# Patient Record
Sex: Male | Born: 1955 | Race: White | Marital: Single | Smoking: Current every day smoker
Health system: Southern US, Community
[De-identification: ages and names within clinical notes are randomized; demographics above are authoritative.]

## PROBLEM LIST (undated history)

## (undated) DIAGNOSIS — E119 Type 2 diabetes mellitus without complications: Secondary | ICD-10-CM

## (undated) DIAGNOSIS — J449 Chronic obstructive pulmonary disease, unspecified: Secondary | ICD-10-CM

## (undated) DIAGNOSIS — E785 Hyperlipidemia, unspecified: Secondary | ICD-10-CM

## (undated) DIAGNOSIS — I10 Essential (primary) hypertension: Secondary | ICD-10-CM

## (undated) DIAGNOSIS — C801 Malignant (primary) neoplasm, unspecified: Secondary | ICD-10-CM

## (undated) HISTORY — PX: CORONARY STENT PLACEMENT: SHX1402

## (undated) HISTORY — PX: APPENDECTOMY: SHX54

---

## 2007-03-19 HISTORY — PX: BELOW KNEE LEG AMPUTATION: SUR23

## 2012-03-23 ENCOUNTER — Inpatient Hospital Stay (HOSPITAL_COMMUNITY)
Admission: EM | Admit: 2012-03-23 | Discharge: 2012-03-25 | DRG: 247 | Disposition: A | Discharge status: Home / Self Care | Payer: Non-veteran care | Attending: Cardiovascular Disease | Admitting: Cardiovascular Disease

## 2012-03-23 ENCOUNTER — Encounter (HOSPITAL_COMMUNITY): Payer: Self-pay | Admitting: Emergency Medicine

## 2012-03-23 ENCOUNTER — Emergency Department (HOSPITAL_COMMUNITY): Payer: Non-veteran care

## 2012-03-23 DIAGNOSIS — Y9210 Unspecified residential institution as the place of occurrence of the external cause: Secondary | ICD-10-CM | POA: Diagnosis not present

## 2012-03-23 DIAGNOSIS — I2510 Atherosclerotic heart disease of native coronary artery without angina pectoris: Secondary | ICD-10-CM | POA: Diagnosis present

## 2012-03-23 DIAGNOSIS — E119 Type 2 diabetes mellitus without complications: Secondary | ICD-10-CM | POA: Diagnosis present

## 2012-03-23 DIAGNOSIS — Z79899 Other long term (current) drug therapy: Secondary | ICD-10-CM

## 2012-03-23 DIAGNOSIS — T50995A Adverse effect of other drugs, medicaments and biological substances, initial encounter: Secondary | ICD-10-CM | POA: Diagnosis not present

## 2012-03-23 DIAGNOSIS — Z89519 Acquired absence of unspecified leg below knee: Secondary | ICD-10-CM

## 2012-03-23 DIAGNOSIS — L270 Generalized skin eruption due to drugs and medicaments taken internally: Secondary | ICD-10-CM | POA: Diagnosis not present

## 2012-03-23 DIAGNOSIS — S88119A Complete traumatic amputation at level between knee and ankle, unspecified lower leg, initial encounter: Secondary | ICD-10-CM

## 2012-03-23 DIAGNOSIS — I10 Essential (primary) hypertension: Secondary | ICD-10-CM | POA: Diagnosis present

## 2012-03-23 DIAGNOSIS — Z955 Presence of coronary angioplasty implant and graft: Secondary | ICD-10-CM

## 2012-03-23 DIAGNOSIS — I214 Non-ST elevation (NSTEMI) myocardial infarction: Principal | ICD-10-CM | POA: Diagnosis present

## 2012-03-23 DIAGNOSIS — E7800 Pure hypercholesterolemia, unspecified: Secondary | ICD-10-CM | POA: Diagnosis present

## 2012-03-23 DIAGNOSIS — Z87891 Personal history of nicotine dependence: Secondary | ICD-10-CM

## 2012-03-23 DIAGNOSIS — E785 Hyperlipidemia, unspecified: Secondary | ICD-10-CM | POA: Diagnosis present

## 2012-03-23 HISTORY — DX: Hyperlipidemia, unspecified: E78.5

## 2012-03-23 HISTORY — DX: Essential (primary) hypertension: I10

## 2012-03-23 HISTORY — DX: Type 2 diabetes mellitus without complications: E11.9

## 2012-03-23 LAB — COMPREHENSIVE METABOLIC PANEL
Albumin: 3.6 g/dL (ref 3.5–5.2)
Alkaline Phosphatase: 181 U/L — ABNORMAL HIGH (ref 39–117)
BUN: 12 mg/dL (ref 6–23)
CO2: 30 mEq/L (ref 19–32)
Chloride: 99 mEq/L (ref 96–112)
Creatinine, Ser: 0.95 mg/dL (ref 0.50–1.35)
GFR calc Af Amer: >90 mL/min (ref >90)
GFR calc non Af Amer: >90 mL/min (ref >90)
Glucose, Bld: 105 mg/dL — ABNORMAL HIGH (ref 70–99)
Potassium: 4.2 mEq/L (ref 3.5–5.1)
Total Bilirubin: 0.4 mg/dL (ref 0.3–1.2)

## 2012-03-23 LAB — POCT I-STAT TROPONIN I: Troponin i, poc: 0.03 ng/mL (ref 0.00–0.08)

## 2012-03-23 LAB — CBC
HCT: 47.5 % (ref 39.0–52.0)
Hemoglobin: 16.9 g/dL (ref 13.0–17.0)
MCV: 91.2 fL (ref 78.0–100.0)
RBC: 5.21 MIL/uL (ref 4.22–5.81)
WBC: 11.6 10*3/uL — ABNORMAL HIGH (ref 4.0–10.5)

## 2012-03-23 MED ORDER — MORPHINE SULFATE 4 MG/ML IJ SOLN
4.0000 mg | INTRAMUSCULAR | Status: DC | PRN
  Administered 2012-03-23 – 2012-03-24 (×3): 4 mg via INTRAVENOUS
  Filled 2012-03-23 (×3): qty 1

## 2012-03-23 MED ORDER — ASPIRIN 81 MG PO CHEW
324.0000 mg | CHEWABLE_TABLET | Freq: Once | ORAL | Status: AC
  Administered 2012-03-23: 324 mg via ORAL

## 2012-03-23 MED ORDER — ONDANSETRON HCL 4 MG/2ML IJ SOLN
4.0000 mg | Freq: Once | INTRAMUSCULAR | Status: AC
  Administered 2012-03-23: 4 mg via INTRAVENOUS
  Filled 2012-03-23: qty 2

## 2012-03-23 MED ORDER — HEPARIN BOLUS VIA INFUSION
4000.0000 [IU] | Freq: Once | INTRAVENOUS | Status: AC
  Administered 2012-03-24: 4000 [IU] via INTRAVENOUS

## 2012-03-23 MED ORDER — HEPARIN (PORCINE) IN NACL 100-0.45 UNIT/ML-% IJ SOLN
1400.0000 [IU]/h | INTRAMUSCULAR | Status: DC
  Administered 2012-03-24: 1200 [IU]/h via INTRAVENOUS
  Filled 2012-03-23 (×2): qty 250

## 2012-03-23 MED ORDER — NITROGLYCERIN 0.4 MG SL SUBL
0.4000 mg | SUBLINGUAL_TABLET | SUBLINGUAL | Status: AC | PRN
  Administered 2012-03-23 (×3): 0.4 mg via SUBLINGUAL
  Filled 2012-03-23 (×3): qty 25

## 2012-03-23 NOTE — Progress Notes (Signed)
ANTICOAGULATION CONSULT NOTE - Initial Consult  Pharmacy Consult for heparin Indication: chest pain/ACS  Allergies  Allergen Reactions  . Ciprofloxacin     Patient Measurements: Height: 5\' 9"  (175.3 cm) Weight: 210 lb (95.255 kg) IBW/kg (Calculated) : 70.7  Heparin Dosing Weight: 90 kg   Vital Signs: Temp: 99.3 F (37.4 C) (01/06 1853) Temp src: Oral (01/06 1853) BP: 130/70 mmHg (01/06 2300) Pulse Rate: 93  (01/06 2300)  Labs:  Basename 03/23/12 2009  HGB 16.9  HCT 47.5  PLT 249  APTT --  LABPROT --  INR --  HEPARINUNFRC --  CREATININE 0.95  CKTOTAL --  CKMB --  TROPONINI --    Estimated Creatinine Clearance: 100 ml/min (by C-G formula based on Cr of 0.95).   Medical History: Past Medical History  Diagnosis Date  . Hypertension   . Diabetes mellitus without complication   . Hyperlipemia     Medications:  Scheduled:    . [COMPLETED] aspirin  324 mg Oral Once  . [COMPLETED] ondansetron (ZOFRAN) IV  4 mg Intravenous Once    Assessment: 57 yo male presented with chest pain. Pharmacy to manage IV heparin.   Goal of Therapy:  Heparin level 0.3-0.7 units/ml Monitor platelets by anticoagulation protocol: Yes   Plan:  1. Heparin IV bolus of 4000 units x 1, then IV infusion of 1200 units/hr.  2. Heparin level in 6 hours.  3. Daily CBC, heparin level.   Emeline Gins 03/23/2012,11:40 PM

## 2012-03-23 NOTE — ED Notes (Signed)
Pt arrived by EMS. C/o cp starting around 1745. Pt stated that cp is a tightness in chest with pain to left shoulder. No SOB, n/v. 225/120 on arrival with some diaphoresis. EMS administered ASA 324MG .

## 2012-03-23 NOTE — ED Provider Notes (Signed)
History     CSN: [id number]  Arrival date & time 03/23/12  1845   First MD Initiated Contact with Patient 03/23/12 1852      Chief Complaint  Patient presents with  . Chest Pain    (Consider location/radiation/quality/duration/timing/severity/associated sxs/prior treatment) HPI  Martin Thomas is a 57 y.o. male c/o pressure-like substerma chest pain, radiating to both arms, rated at 4/10 at worst, 4/10 now.  Pain started x1hours ago while pt was sleeping. Pain has been constant, non-exertional, non-pleuritic or positional. Pain is associated with SOB, diaphoresis. Denies N/V, cough, fever, cough, back pain, syncope. Denies recent travel, leg swelling, hemoptysis. Pt had ASA enroute.   RF: former smoker, DM, HTN, high cholesterol, FH Cath: none Last Stress test: years ago PCP: [facility] at [facility]  No past medical history on file.  No past surgical history on file.  No family history on file.  History  Substance Use Topics  . Smoking status: Not on file  . Smokeless tobacco: Not on file  . Alcohol Use: Not on file      Review of Systems  Constitutional: Positive for diaphoresis. Negative for fever.  Respiratory: Positive for shortness of breath.   Cardiovascular: Positive for chest pain.  Gastrointestinal: Negative for nausea, vomiting, abdominal pain and diarrhea.  All other systems reviewed and are negative.    Allergies  Review of patient's allergies indicates not on file.  Home Medications  No current outpatient prescriptions on file.  BP 166/106  Pulse 102  Temp 99.3 F (37.4 C) (Oral)  Resp 14  SpO2 92%  Physical Exam  Nursing note and vitals reviewed. Constitutional: He is oriented to person, place, and time. He appears well-developed and well-nourished. No distress.       Left BKA  HENT:  Head: Normocephalic.  Mouth/Throat: Oropharynx is clear and moist.  Eyes: Conjunctivae normal and EOM are normal. Pupils are equal, round, and reactive to  light.  Cardiovascular: Normal rate, regular rhythm, normal heart sounds and intact distal pulses.   Pulmonary/Chest: Effort normal and breath sounds normal. No stridor. No respiratory distress. He has no wheezes. He has no rales. He exhibits no tenderness.  Abdominal: Soft. Bowel sounds are normal. He exhibits no distension and no mass. There is no tenderness. There is no guarding.  Musculoskeletal: Normal range of motion.  Neurological: He is alert and oriented to person, place, and time.  Psychiatric: He has a normal mood and affect.    ED Course  Procedures (including critical care time)  Labs Reviewed  CBC - Abnormal; Notable for the following:    WBC 11.6 (*)     All other components within normal limits  COMPREHENSIVE METABOLIC PANEL - Abnormal; Notable for the following:    Glucose, Bld 105 (*)     Calcium 11.0 (*)     AST 66 (*)     ALT 82 (*)     Alkaline Phosphatase 181 (*)     All other components within normal limits  POCT I-STAT TROPONIN I - Abnormal; Notable for the following:    Troponin i, poc 0.14 (*)     All other components within normal limits  POCT I-STAT TROPONIN I   Dg Chest Portable 1 View  03/23/2012  *RADIOLOGY REPORT*  Clinical Data: Chest pain.  Hypertensive diabetic.  Asthma.  Prior smoker.  PORTABLE CHEST - 1 VIEW  Comparison: None.  Findings: Pulmonary vascular prominence with peribronchial thickening.  No segmental infiltrate or gross pneumothorax.  Heart size within normal limits.  Calcified aorta.  The patient would eventually benefit from follow-up two-view chest with cardiac leads removed.  IMPRESSION: Pulmonary vascular prominence with peribronchial thickening.  Please see above.   Original Report Authenticated By: Lacy Duverney, M.D.     1. NSTEMI (non-ST elevated myocardial infarction)      MDM  Patient with very convincing story for cardiac origin chest pain. Pain is relieved with nitroglycerin.  TIMI score of 1 based on multiple  RF  Delta Trop 0.14   Patient will be admitted under the care of Dr. Julian Reil of [facility].     Wynetta Emery, PA-C 03/24/12 0003

## 2012-03-24 ENCOUNTER — Encounter (HOSPITAL_COMMUNITY)
Admission: EM | Disposition: A | Discharge status: Home / Self Care | Payer: Self-pay | Source: Home / Self Care | Attending: Cardiovascular Disease

## 2012-03-24 ENCOUNTER — Other Ambulatory Visit: Payer: Self-pay

## 2012-03-24 ENCOUNTER — Encounter (HOSPITAL_COMMUNITY): Payer: Self-pay | Admitting: Internal Medicine

## 2012-03-24 DIAGNOSIS — I214 Non-ST elevation (NSTEMI) myocardial infarction: Secondary | ICD-10-CM | POA: Diagnosis present

## 2012-03-24 DIAGNOSIS — Z89519 Acquired absence of unspecified leg below knee: Secondary | ICD-10-CM

## 2012-03-24 DIAGNOSIS — E7800 Pure hypercholesterolemia, unspecified: Secondary | ICD-10-CM | POA: Diagnosis present

## 2012-03-24 DIAGNOSIS — E119 Type 2 diabetes mellitus without complications: Secondary | ICD-10-CM | POA: Diagnosis present

## 2012-03-24 DIAGNOSIS — I10 Essential (primary) hypertension: Secondary | ICD-10-CM | POA: Diagnosis present

## 2012-03-24 HISTORY — PX: LEFT HEART CATHETERIZATION WITH CORONARY ANGIOGRAM: SHX5451

## 2012-03-24 HISTORY — PX: PERCUTANEOUS CORONARY STENT INTERVENTION (PCI-S): SHX5485

## 2012-03-24 LAB — GLUCOSE, CAPILLARY
Glucose-Capillary: 194 mg/dL — ABNORMAL HIGH (ref 70–99)
Glucose-Capillary: 216 mg/dL — ABNORMAL HIGH (ref 70–99)

## 2012-03-24 LAB — CBC
HCT: 44.2 % (ref 39.0–52.0)
Hemoglobin: 15.2 g/dL (ref 13.0–17.0)
MCH: 31.7 pg (ref 26.0–34.0)
MCHC: 34.4 g/dL (ref 30.0–36.0)
MCV: 92.1 fL (ref 78.0–100.0)
RDW: 14.2 % (ref 11.5–15.5)

## 2012-03-24 LAB — TSH: TSH: 6.001 u[IU]/mL — ABNORMAL HIGH (ref 0.350–4.500)

## 2012-03-24 LAB — TROPONIN I: Troponin I: 0.63 ng/mL (ref <0.30)

## 2012-03-24 SURGERY — LEFT HEART CATHETERIZATION WITH CORONARY ANGIOGRAM
Anesthesia: LOCAL

## 2012-03-24 MED ORDER — MIDAZOLAM HCL 2 MG/2ML IJ SOLN
INTRAMUSCULAR | Status: AC
  Filled 2012-03-24: qty 2

## 2012-03-24 MED ORDER — BIVALIRUDIN 250 MG IV SOLR
INTRAVENOUS | Status: AC
  Filled 2012-03-24: qty 250

## 2012-03-24 MED ORDER — HEPARIN SODIUM (PORCINE) 1000 UNIT/ML IJ SOLN
INTRAMUSCULAR | Status: AC
  Filled 2012-03-24: qty 1

## 2012-03-24 MED ORDER — INSULIN ASPART 100 UNIT/ML ~~LOC~~ SOLN
0.0000 [IU] | SUBCUTANEOUS | Status: DC
  Administered 2012-03-24: 23:00:00 11 [IU] via SUBCUTANEOUS
  Administered 2012-03-24: 3 [IU] via SUBCUTANEOUS
  Administered 2012-03-24: 5 [IU] via SUBCUTANEOUS
  Administered 2012-03-24: 3 [IU] via SUBCUTANEOUS

## 2012-03-24 MED ORDER — ASPIRIN 81 MG PO CHEW: 324.0000 mg | CHEWABLE_TABLET | ORAL | Status: DC

## 2012-03-24 MED ORDER — NITROGLYCERIN 0.2 MG/ML ON CALL CATH LAB
INTRAVENOUS | Status: AC
  Filled 2012-03-24: qty 1

## 2012-03-24 MED ORDER — ALPRAZOLAM 0.25 MG PO TABS: 0.2500 mg | ORAL_TABLET | Freq: Two times a day (BID) | ORAL | Status: DC | PRN

## 2012-03-24 MED ORDER — ACETAMINOPHEN 325 MG PO TABS
650.0000 mg | ORAL_TABLET | ORAL | Status: DC | PRN
  Administered 2012-03-24: 650 mg via ORAL
  Filled 2012-03-24: qty 2

## 2012-03-24 MED ORDER — ONDANSETRON HCL 4 MG/2ML IJ SOLN: 4.0000 mg | Freq: Four times a day (QID) | INTRAMUSCULAR | Status: DC | PRN

## 2012-03-24 MED ORDER — ASPIRIN 300 MG RE SUPP: 300.0000 mg | RECTAL | Status: DC

## 2012-03-24 MED ORDER — ASPIRIN EC 81 MG PO TBEC
81.0000 mg | DELAYED_RELEASE_TABLET | Freq: Every day | ORAL | Status: DC
  Administered 2012-03-25: 81 mg via ORAL
  Filled 2012-03-24: qty 1

## 2012-03-24 MED ORDER — METOPROLOL TARTRATE 12.5 MG HALF TABLET
12.5000 mg | ORAL_TABLET | Freq: Two times a day (BID) | ORAL | Status: DC
  Administered 2012-03-24 (×2): 12.5 mg via ORAL
  Filled 2012-03-24 (×4): qty 1

## 2012-03-24 MED ORDER — CYCLOBENZAPRINE HCL 10 MG PO TABS
10.0000 mg | ORAL_TABLET | Freq: Every day | ORAL | Status: DC
  Administered 2012-03-24: 21:00:00 10 mg via ORAL
  Filled 2012-03-24 (×2): qty 1

## 2012-03-24 MED ORDER — LISINOPRIL 10 MG PO TABS
10.0000 mg | ORAL_TABLET | Freq: Every day | ORAL | Status: DC
  Administered 2012-03-24 – 2012-03-25 (×2): 10 mg via ORAL
  Filled 2012-03-24 (×2): qty 1

## 2012-03-24 MED ORDER — FENTANYL CITRATE 0.05 MG/ML IJ SOLN
INTRAMUSCULAR | Status: AC
  Filled 2012-03-24: qty 2

## 2012-03-24 MED ORDER — PRASUGREL HCL 10 MG PO TABS
10.0000 mg | ORAL_TABLET | Freq: Every day | ORAL | Status: DC
  Administered 2012-03-25: 10 mg via ORAL
  Filled 2012-03-24: qty 1

## 2012-03-24 MED ORDER — TRAZODONE HCL 50 MG PO TABS
50.0000 mg | ORAL_TABLET | Freq: Every day | ORAL | Status: DC
  Administered 2012-03-24: 21:00:00 50 mg via ORAL
  Filled 2012-03-24 (×2): qty 1

## 2012-03-24 MED ORDER — SODIUM CHLORIDE 0.9 % IV SOLN
INTRAVENOUS | Status: DC
  Administered 2012-03-24: 20 mL/h via INTRAVENOUS

## 2012-03-24 MED ORDER — PANTOPRAZOLE SODIUM 40 MG PO TBEC
80.0000 mg | DELAYED_RELEASE_TABLET | Freq: Every day | ORAL | Status: DC
  Administered 2012-03-24 – 2012-03-25 (×2): 80 mg via ORAL
  Filled 2012-03-24 (×2): qty 2

## 2012-03-24 MED ORDER — LIDOCAINE HCL (PF) 1 % IJ SOLN
INTRAMUSCULAR | Status: AC
  Filled 2012-03-24: qty 30

## 2012-03-24 MED ORDER — NITROGLYCERIN IN D5W 200-5 MCG/ML-% IV SOLN
2.0000 ug/min | INTRAVENOUS | Status: DC
  Administered 2012-03-24: 3 ug/min via INTRAVENOUS
  Filled 2012-03-24: qty 250

## 2012-03-24 MED ORDER — NITROGLYCERIN 0.4 MG SL SUBL: 0.4000 mg | SUBLINGUAL_TABLET | SUBLINGUAL | Status: DC | PRN

## 2012-03-24 MED ORDER — PREDNISONE 50 MG PO TABS
60.0000 mg | ORAL_TABLET | Freq: Four times a day (QID) | ORAL | Status: DC
  Administered 2012-03-24 – 2012-03-25 (×2): 60 mg via ORAL
  Filled 2012-03-24 (×3): qty 1

## 2012-03-24 MED ORDER — SENNA 8.6 MG PO TABS
1.0000 | ORAL_TABLET | Freq: Every day | ORAL | Status: DC
  Administered 2012-03-24 – 2012-03-25 (×2): 8.6 mg via ORAL
  Filled 2012-03-24 (×2): qty 1

## 2012-03-24 MED ORDER — OMEGA-3-ACID ETHYL ESTERS 1 G PO CAPS
2.0000 g | ORAL_CAPSULE | Freq: Two times a day (BID) | ORAL | Status: DC
  Administered 2012-03-24 – 2012-03-25 (×3): 2 g via ORAL
  Filled 2012-03-24 (×4): qty 2

## 2012-03-24 MED ORDER — DIPHENHYDRAMINE HCL 25 MG PO CAPS: 25.0000 mg | ORAL_CAPSULE | Freq: Four times a day (QID) | ORAL | Status: DC | PRN

## 2012-03-24 MED ORDER — ATORVASTATIN CALCIUM 20 MG PO TABS
20.0000 mg | ORAL_TABLET | Freq: Every day | ORAL | Status: DC
  Administered 2012-03-24 – 2012-03-25 (×2): 20 mg via ORAL
  Filled 2012-03-24 (×2): qty 1

## 2012-03-24 MED ORDER — GABAPENTIN 100 MG PO CAPS
200.0000 mg | ORAL_CAPSULE | Freq: Three times a day (TID) | ORAL | Status: DC
  Administered 2012-03-24 – 2012-03-25 (×3): 200 mg via ORAL
  Filled 2012-03-24 (×7): qty 2

## 2012-03-24 MED ORDER — SODIUM CHLORIDE 0.9 % IV SOLN
1.0000 mL/kg/h | INTRAVENOUS | Status: AC
  Administered 2012-03-24: 1 mL/kg/h via INTRAVENOUS

## 2012-03-24 MED ORDER — HEPARIN (PORCINE) IN NACL 2-0.9 UNIT/ML-% IJ SOLN
INTRAMUSCULAR | Status: AC
  Filled 2012-03-24: qty 1000

## 2012-03-24 MED ORDER — VERAPAMIL HCL 2.5 MG/ML IV SOLN
INTRAVENOUS | Status: AC
  Filled 2012-03-24: qty 2

## 2012-03-24 MED ORDER — PRASUGREL HCL 10 MG PO TABS
ORAL_TABLET | ORAL | Status: AC
  Administered 2012-03-25: 10:00:00 10 mg via ORAL
  Filled 2012-03-24: qty 6

## 2012-03-24 MED ORDER — ALBUTEROL SULFATE HFA 108 (90 BASE) MCG/ACT IN AERS
2.0000 | INHALATION_SPRAY | Freq: Four times a day (QID) | RESPIRATORY_TRACT | Status: DC | PRN
  Administered 2012-03-24: 2 via RESPIRATORY_TRACT
  Filled 2012-03-24: qty 6.7

## 2012-03-24 NOTE — H&P (Addendum)
[facility] History and Physical  Jameil Whitmoyer AVW:[id number] DOB: 1955/03/19 DOA: 03/23/2012  Referring physician: ED PCP: Default, Provider, MD  Specialists: None  Chief Complaint: Chest pain  HPI: Martin Thomas is a 57 y.o. male presented to ED with c/o pressure-like, substernal chest pain, radiating to both arms, 4/10 at worst, woke him from sleep onset 1 hour PTA, constant, relieved finally by morphine.  Is associated with SOB, diaphoresis, no N/V, cough, fever.  In the ED the patient was found to have an elevated troponin (which continues to rise), to go along with his chest pain.  Hospitalist is admitting [facility].  Review of Systems: 12 systems reviewed and otherwise negative.  Past Medical History  Diagnosis Date  . Hypertension   . Diabetes mellitus without complication   . Hyperlipemia    Past Surgical History  Procedure Date  . Below knee leg amputation     left  . Appendectomy    Social History:  reports that he quit smoking about 2 months ago. He does not have any smokeless tobacco history on file. He reports that he does not drink alcohol or use illicit drugs.   Allergies  Allergen Reactions  . Ciprofloxacin     Family History  Problem Relation Age of Onset  . CAD    . Heart attack Father     died of second MI at 62  . Heart attack Mother   . Heart attack Paternal Uncle 53    died of MI at 95    Prior to Admission medications   Medication Sig Start Date End Date Taking? Authorizing Provider  albuterol (PROVENTIL HFA;VENTOLIN HFA) 108 (90 BASE) MCG/ACT inhaler Inhale 2 puffs into the lungs every 6 (six) hours as needed.   Yes Historical Provider, MD  atorvastatin (LIPITOR) 20 MG tablet Take 20 mg by mouth daily.   Yes Historical Provider, MD  cyclobenzaprine (FLEXERIL) 10 MG tablet Take 10 mg by mouth at bedtime.   Yes Historical Provider, MD  gabapentin (NEURONTIN) 100 MG capsule Take 200 mg by mouth 3 (three) times daily.    Yes Historical Provider, MD  HYDROcodone-acetaminophen (NORCO/VICODIN) 5-325 MG per tablet Take 1 tablet by mouth every 8 (eight) hours as needed. For pain   Yes Historical Provider, MD  lisinopril (PRINIVIL,ZESTRIL) 10 MG tablet Take 10 mg by mouth daily.   Yes Historical Provider, MD  omega-3 acid ethyl esters (LOVAZA) 1 G capsule Take 2 g by mouth 2 (two) times daily.   Yes Historical Provider, MD  omeprazole (PRILOSEC) 40 MG capsule Take 40 mg by mouth daily.   Yes Historical Provider, MD  senna (SENOKOT) 8.6 MG TABS Take 1 tablet by mouth daily.   Yes Historical Provider, MD  traZODone (DESYREL) 50 MG tablet Take 50 mg by mouth at bedtime.   Yes Historical Provider, MD   Physical Exam: Filed Vitals:   03/23/12 2015 03/23/12 2115 03/23/12 2145 03/23/12 2300  BP: 151/77 115/71 146/94 130/70  Pulse: 107 108 90 93  Temp:      TempSrc:      Resp: 15 21 13 13   Height:      Weight:      SpO2: 93% 92% 90% 92%    General:  NAD, resting comfortably in bed Eyes: PEERLA EOMI ENT: mucous membranes moist Neck: supple w/o JVD Cardiovascular: RRR w/o MRG Respiratory: CTA B Abdomen: soft, nt, nd, bs+ Skin: no rash nor lesion Musculoskeletal: MAE, full ROM all 4 extremities  Psychiatric: normal tone and affect Neurologic: AAOx3, grossly non-focal  Labs on Admission:  Basic Metabolic Panel:  Lab 03/23/12 1478  NA 140  K 4.2  CL 99  CO2 30  GLUCOSE 105*  BUN 12  CREATININE 0.95  CALCIUM 11.0*  MG --  PHOS --   Liver Function Tests:  Lab 03/23/12 2009  AST 66*  ALT 82*  ALKPHOS 181*  BILITOT 0.4  PROT 7.5  ALBUMIN 3.6   No results found for this basename: LIPASE:5,AMYLASE:5 in the last 168 hours No results found for this basename: AMMONIA:5 in the last 168 hours CBC:  Lab 03/23/12 2009  WBC 11.6*  NEUTROABS --  HGB 16.9  HCT 47.5  MCV 91.2  PLT 249   Cardiac Enzymes: No results found for this basename: CKTOTAL:5,CKMB:5,CKMBINDEX:5,TROPONINI:5 in the last 168  hours  BNP (last 3 results) No results found for this basename: PROBNP:3 in the last 8760 hours CBG: No results found for this basename: GLUCAP:5 in the last 168 hours  Radiological Exams on Admission: Dg Chest Portable 1 View  03/23/2012  *RADIOLOGY REPORT*  Clinical Data: Chest pain.  Hypertensive diabetic.  Asthma.  Prior smoker.  PORTABLE CHEST - 1 VIEW  Comparison: None.  Findings: Pulmonary vascular prominence with peribronchial thickening.  No segmental infiltrate or gross pneumothorax.  Heart size within normal limits.  Calcified aorta.  The patient would eventually benefit from follow-up two-view chest with cardiac leads removed.  IMPRESSION: Pulmonary vascular prominence with peribronchial thickening.  Please see above.   Original Report Authenticated By: Lacy Duverney, M.D.     EKG: Independently reviewed.  Assessment/Plan Principal Problem:  *NSTEMI (non-ST elevated myocardial infarction) Active Problems:  DM2 (diabetes mellitus, type 2)  HTN (hypertension)  High cholesterol   1. NSTEMI - patient having very classic presentation of NSTEMI with numerous risk factors including DM, HTN, high cholesterol, smoking history, and family history.  Also having elevated troponin, and classic anginal symptoms.  Additionally I question if there may be a faint Q wave in lead III.  Regardless I have spoken with [facility], started patient on heparin gtt, made patient NPO in anticipation of cath lab possibly tomorrow.  Diagnosis discussed with patient and he states he actually "figured he was going to have one sooner or later" given his family history. 2. DM2 - patient doesn't appear to be on any home meds at this time, putting him on med dose SSI while here Q4H since he is currently NPO. 3. HTN - continue home meds, also starting min dose beta blocker for #1 4. High cholesterol - continue home statin.  Spoke with [facility] and they will see patient in AM.  Code Status: Full Code (must indicate code  status--if unknown or must be presumed, indicate so) Family Communication: No family in room (indicate person spoken with, if applicable, with phone number if by telephone) Disposition Plan: Admit to inpatient (indicate anticipated LOS)  Time spent: 70 min  GARDNER, JARED M. [facility] Pager [phone]  If 7PM-7AM, please contact night-coverage [email or web address] Password [email or web address] 03/24/2012, 2:31 AM

## 2012-03-24 NOTE — Brief Op Note (Addendum)
Brief Cardiac Catheterization & PCI Note  03/23/2012 - 03/24/2012  3:23 PM  PATIENT:  Martin Thomas  57 y.o. male with Cardiac RFs of HTN, HLD, DM - presented with NSTEMI.  Referred for Cardiac Cath / PCI.  PRE-OPERATIVE DIAGNOSIS:  NSTEMI  POST-OPERATIVE DIAGNOSIS:  Culprit lesion - 80-90% distal RCA just prior to RPDA/PAV bifurcation with an additional separate mid RCA 80% lesion   Mild to moderate lesions in mid LAD & Cx-Lateral OM.  Normal, preserved EF 55-60%, no WMA & EDP.  Mild IVP Contrast Reaction -- improved after IV Solumedrol & IV Benadryl.  PROCEDURE:  Procedure(s) (LRB) with comments: LEFT HEART CATHETERIZATION WITH CORONARY ANGIOGRAM (N/A) PERCUTANEOUS CORONARY STENT INTERVENTION (PCI-S) ()  SURGEON:   * Marykay Lex, MD - Primary  ANESTHESIA:   local and IV sedation; Fentanyl - 100cg; Versed 4 mg;   EBL:  Total I/O In: 165.1 [I.V.:165.1] Out: 375 [Urine:375]  EQUIPMENT: 5 Fr R Radial Glide sheath; TIG 4.0 (L&RCA Angiography), Angled Pigtail (LV Hemodynamics & LV Gram); PCI - 6Fr sheat upsize, 6Fr JR4, BMW into RPAV, Prowater for PDA as workhorse)   PCI - dRCA 3.9mm x 16 mm Promus Premier DES - post-dilated to 3.3 mm  Mid RCA 3.5 mm x 16 mm Promus Premier DES - post-dilated to 4.0 mm  MEDICATIONS USED:  LIDOCAINE 2ml; IV Solumedrol 125mg , IV Benadryl 25mg , IV Pepcid 25mg   Heparin 5500 Units after sheath placed  Angiomax Bolus & gtt - reduced to low rate x 2 hr post PCI  60mg  PO Effient Radial Cocktail: 5 mg Verapamil, 400 mcg NTG, 2 ml 2% Lidocaine in 10 ml NS Omnipaque Contrast:  TR Band: 14 ml Air, 1510 - non-occlusive hemostatis.  DICTATION: .Note written in paper chart and Note written in EPIC  PLAN OF CARE: Admit for overnight observation; Angiomax x 2 hr @ reduced rate.  Adjust cardiac meds, but anticipate d/c as early as AM.  Monitor for post IVP reaction.  PATIENT DISPOSITION:  PACU - hemodynamically stable.   Delay start of  Pharmacological VTE agent (>24hrs) due to surgical blood loss or risk of bleeding: not applicable.  Marykay Lex, M.D., M.S. [facility] [address] [place], [place]  [address]  [phone] Pager # [phone] 03/24/2012 3:31 PM

## 2012-03-24 NOTE — Progress Notes (Signed)
ANTICOAGULATION CONSULT NOTE - Follow Up Consult  Pharmacy Consult for heparin Indication: chest pain/ACS  Labs:  Basename 03/24/12 0615 03/24/12 0240 03/23/12 2343 03/23/12 2009  HGB 15.2 -- -- 16.9  HCT 44.2 -- -- 47.5  PLT 242 -- -- 249  APTT -- -- -- --  LABPROT -- -- 11.8 --  INR -- -- 0.87 --  HEPARINUNFRC 0.24* -- -- --  CREATININE -- -- -- 0.95  CKTOTAL -- -- -- --  CKMB -- -- -- --  TROPONINI -- 1.53* -- --    Assessment: 56yo male subtherapeutic on heparin with initial dosing for CP.   Goal of Therapy:  Heparin level 0.3-0.7 units/ml   Plan:  Will increase heparin gtt by 2 units/kg/hr to 1400 units/hr and check level with next CE.  Colleen Can PharmD BCPS 03/24/2012,7:23 AM

## 2012-03-24 NOTE — ED Notes (Signed)
Troponin results given to Dr. Norlene Campbell by B. Bing Plume, EMT

## 2012-03-24 NOTE — ED Notes (Addendum)
Critical Troponin: 1.53 reported to Dr. Norlene Campbell and Dr. Julian Reil

## 2012-03-24 NOTE — Plan of Care (Signed)
Problem: Consults Goal: Cardiac Cath Patient Education (See Patient Education module for education specifics.) Outcome: Completed/Met Date Met:  03/24/12 PA spoke with pt, pt has viewed education video

## 2012-03-24 NOTE — ED Notes (Signed)
Pt. Called out to nurses station reporting chest pain "starting up again". Pt. Given 4 mg morphine at stated "that stopped it almost immediately".
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
## 2012-03-25 LAB — BASIC METABOLIC PANEL
BUN: 16 mg/dL (ref 6–23)
CO2: 25 mEq/L (ref 19–32)
Chloride: 94 mEq/L — ABNORMAL LOW (ref 96–112)
Creatinine, Ser: 0.89 mg/dL (ref 0.50–1.35)
GFR calc Af Amer: >90 mL/min (ref >90)
GFR calc non Af Amer: >90 mL/min (ref >90)
GFR calc non Af Amer: >90 mL/min (ref >90)
Glucose, Bld: 304 mg/dL — ABNORMAL HIGH (ref 70–99)
Potassium: 4.1 mEq/L (ref 3.5–5.1)
Potassium: 4.1 mEq/L (ref 3.5–5.1)
Sodium: 134 mEq/L — ABNORMAL LOW (ref 135–145)

## 2012-03-25 LAB — GLUCOSE, CAPILLARY: Glucose-Capillary: 254 mg/dL — ABNORMAL HIGH (ref 70–99)

## 2012-03-25 LAB — CBC
MCHC: 34.5 g/dL (ref 30.0–36.0)
Platelets: 259 10*3/uL (ref 150–400)
RDW: 13.6 % (ref 11.5–15.5)
WBC: 14.3 10*3/uL — ABNORMAL HIGH (ref 4.0–10.5)

## 2012-03-25 MED ORDER — INSULIN ASPART 100 UNIT/ML ~~LOC~~ SOLN
0.0000 [IU] | Freq: Three times a day (TID) | SUBCUTANEOUS | Status: DC
Start: 2012-03-25 — End: 2012-03-25
  Administered 2012-03-25 (×2): 8 [IU] via SUBCUTANEOUS

## 2012-03-25 MED ORDER — PRASUGREL HCL 10 MG PO TABS: 10.0000 mg | ORAL_TABLET | Freq: Every day | ORAL | Status: DC

## 2012-03-25 MED ORDER — NITROGLYCERIN 0.4 MG SL SUBL: 0.4000 mg | SUBLINGUAL_TABLET | SUBLINGUAL | Status: DC | PRN

## 2012-03-25 MED ORDER — INSULIN GLARGINE 100 UNIT/ML ~~LOC~~ SOLN: 60.0000 [IU] | Freq: Two times a day (BID) | SUBCUTANEOUS | Status: DC

## 2012-03-25 MED ORDER — METOPROLOL TARTRATE 25 MG PO TABS: 25.0000 mg | ORAL_TABLET | Freq: Two times a day (BID) | ORAL | Status: DC

## 2012-03-25 MED ORDER — ASPIRIN 81 MG PO TBEC: 81.0000 mg | DELAYED_RELEASE_TABLET | Freq: Every day | ORAL | Status: DC

## 2012-03-25 MED ORDER — INSULIN ASPART 100 UNIT/ML ~~LOC~~ SOLN: 80.0000 [IU] | Freq: Three times a day (TID) | SUBCUTANEOUS | Status: DC

## 2012-03-25 MED ORDER — METOPROLOL TARTRATE 25 MG PO TABS
25.0000 mg | ORAL_TABLET | Freq: Two times a day (BID) | ORAL | Status: DC
  Administered 2012-03-25: 10:00:00 25 mg via ORAL

## 2012-03-25 MED FILL — Dextrose Inj 5%: INTRAVENOUS | Qty: 50 | Status: AC

## 2012-03-25 NOTE — Progress Notes (Signed)
Pt. Seen and examined. Agree with the NP/PA-C note as written.   No chest pain, feels "much" better today, more energy, slept well. + pulse radially in the right wrist, no hematoma. Being evaluated for non-weight bearing cardiac rehab options due to his BKA.  He is a long-time [employer] patient. Would recommend follow-up with Dr. Herbie Baltimore and then arranging cardiology follow-up at the [facility] fellows clinic.  Ok for discharge home today.  Chrystie Nose, MD, [facility] Attending Cardiologist [facility]

## 2012-03-25 NOTE — Progress Notes (Signed)
Pt is not able to wear his prosthesis due to nerve pain (being evaluated through [place]). Discussed ed including risk factor modification. Pt is dedicated to staying tobacco free. Interested in [facility] in [place] and will send referral. Will need [facility] insurance approval. Very receptive. [phone] Ethelda Chick CES, ACSM
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
## 2012-03-25 NOTE — ED Provider Notes (Signed)
Medical screening examination/treatment/procedure(s) were conducted as a shared visit with non-physician practitioner(s) and myself.  I personally evaluated the patient during the encounter   56yo M, c/o constant "pressure" like CP, radiating to bilat arms, that began approx 1-2 hours PTA while sleeping.  Assoc with SOB, diaphoresis.  Rec'd ASA, ntg, morphine with relief of symptoms. Multiple risk factors for ACS.  EKG without acute STTW, initial troponin normal, 2nd is elevated.  Start heparin, admit to Cards.     Date: 03/23/2012  Rate: 101  Rhythm: normal sinus rhythm  QRS Axis: normal  Intervals: normal  ST/T Wave abnormalities: normal  Conduction Disutrbances:left posterior fascicular block  Narrative Interpretation:   Old EKG Reviewed: none available.    Laray Anger, DO 03/25/12 [phone]

## 2012-03-26 NOTE — CV Procedure (Signed)
[facility] CARDIAC CATHETERIZATION AND PERCUTANEOUS CORONARY INTERVENTION REPORT  NAME:  Martin Thomas   MRN: [id number] DOB:  March 19, 1955   ADMIT DATE: 03/23/2012 Procedure Date: 03/26/2012  INTERVENTIONAL CARDIOLOGIST: Marykay Lex, M.D., MS PRIMARY CARE PROVIDER: Default, Provider, MD PRIMARY CARDIOLOGIST: None currently - will f/u with Marykay Lex, M.D., MS until he can be established with a [facility] Cardiolgist.   PATIENT:  Martin Thomas is a 57 y.o. male with history of DM, HTN and HLD who presented earlier this AM with SSCP consistent with ACS and has ruled in for NSTEMI.  He was seen in consultation by Dr. Allyson Sabal who had referred him for invasive cardiac evaluation.  PRE-OPERATIVE DIAGNOSIS:  NSTEMI  DM  HTN  HLD  PROCEDURES PERFORMED:    Left Heart Catheterization with Native Coronary Angiography using Right Radial Artery Access  Left Ventriculography  Successful percutaneous coronary intervention of 2 separate sites in the distal and mid RCA using Promus Premier DES stents (distal - 3.0 mm x 16 mm postdilated to 3.3 mm; mid - 3.5 mm x 16 mm postdilated to 4.0 mm)  PROCEDURE: Consent:  Risks of procedure as well as the alternatives and risks of each were explained to the (patient/caregiver).  Consent for procedure obtained. Consent for signed by MD and patient with RN witness -- placed on chart.  The patient was brought to the 2nd Floor [employer] Cardiac Catheterization Lab in the fasting state and prepped and draped in the usual sterile fashion for Right radial access after a modified Allen's test using plethysmography demonstrated excellent Ulnar Artery collateral flow.. Sterile technique was used including antiseptics, cap, gloves, gown, hand hygiene, mask and sheet.  Skin prep: Chlorhexidine.  Time Out: Verified patient identification, verified procedure, site/side was marked, verified correct patient position, special equipment/implants  available, medications/allergies/relevent history reviewed, required imaging and test results available.  Performed  Access: Right Radial Artery; 5 Fr Glide Sheath - Seldinger technique using the Angiocath Micropuncture Kit.  10 mL of standard radial cocktail administered via sheath. Diagnostic Catheterization:  TIG 4.0 advanced and removed over Versicore wire, exchanged over [employer] J-wire. After the catheter reached the ascending aorta, 5000 units of intravenous Heparin was administered.  Left Coronary Artery Angiography: TIG 4.0  Right Coronary Artery Angiography: TIG 4.0  LV Hemodynamics (LV Gram): Angled Pigtail  TR Band:  1510 Hours, 14 mL air; nonocclusive hemostasis was assured using plethysmography and reverse Allen's test.  MEDICATIONS:  ANESTHESIA:   Local Lidocaine 2 ml  SEDATION:  4 mg IV Versed, 100 mcg IV fentanyl ; Premedication: 5 mg oral Valium  Radial Cocktail: 5 mg Verapamil, 400 mcg NTG, 2 ml 2% Lidocaine in 10 ml NS   Anticoagulation: 5000 Units IV Heparin; for PCI: Angiomax bolus followed by drip, reduced to low dose rate for 2 hours post PCI   Anti-Platelet Agent: Prasugrel 60 mg   Treatment of IVP Contrast Reaction: 125 mg IV Solu-Medrol, 50 mg IV Benadryl, 20 mg IV Pepcid.  EBL:   <  10 ml  FINDINGS: Hemodynamics:  Central Aortic / Mean Pressures: 86/64 mmHg; 76  mmHg  Left Ventricular Pressures / EDP: 88/13 mmHg; 18 mmHg  Left Ventriculography:  EF:  roughly 55 %  Wall Motion: no notable regional wall motion   Coronary Anatomy:  Left Main:  Large caliber vessel that bifurcates into the LAD and Circumflex, mild calcification but angiographically normal.  LAD:  large-caliber vessel it tapers into a smaller vessel before  reaching the apex. It actually does not quite reach the apex. There is a proximal 20-30% stenosis, 40% mid stenosis prior to D2 and mild calcification is noted at that point. The LAD then bifurcates into the distal LAD  and D2 the that are essentially equal size, there is minimal luminal irregularities distally except the vessels taper down to very small caliber distally.  D1:  Moderate caliber major diagonal branch that has a small side branch. Angiographically normal.  D2:  Small-to-moderate caliber vessel that is equal in diameter to the distal LAD, minimal luminal irregularities. Left Circumflex: Large-caliber vessel that is off proximal OM 1 followed by a small OM branch before bifurcating into the AV groove circumflex follows on to left posterior lateral branch and OM 2.   OM1:  moderate caliber vessel with a straight proximal small branch. This courses and also Ramus Intermedius distribution. Minimal luminal irregularities.   OM 2: This is likely the terminal branch of the circumflex is a proximal 40-50% lesion at the take off a small branch point as the vessel then courses down along the inferolateral wall almost the apex. The remainder the vessel is relatively normal angiographically.  RCA: Large-caliber dominant vessel with a proximal 20% lesion in a small SA nodal branch takeoff, there is a mid vessel 60% followed by 80% stenosis on either side of the small caliber RV marginal branch (Lesion #2), this is followed by an irregular, hazy appearing 80-90% stenosis (Lesion #1) just prior to the bifurcation into the RPDA and Right Posterior AV Groove Branch (RPAV).  RPDA: Moderate large-caliber vessel that is not actually involved lesion prior to the bifurcation this is minimal luminal irregularities in reaches up around the apex perfusing the entire inferoapical region as well as part of the anteroapical region  RPL Sysytem:The RPAV is a moderate caliber vessel that is not involving the lesion. It provides   After r to small posterior lateral branch and before bifurcating into 2 major posterior lateral branches the most distal 1 provider name nodal artery.eviewing the initial angiographic images, the culprit  lesion was felt to be the distal RCA 80-90% ulcerated lesion just prior to the bifurcation, however the mid vessel lesion was also felt to be significant. Therefore the decision made to proceed with intervention on both of these lesions with plan medical therapy for the remainder of the moderate stenoses in the left coronary system.  Percutaneous Coronary Intervention:  Sheath exchanged for 6 Fr; Angiomax bolus administered with drip initiated after sheath exchanged,  60 mg oral Prasugrel administered  Guide: 6 Fr   JR 4   Guidewires: Pro-water as the [employer] wire -- into the RPDA; [employer] used to wire the RPAV to protect this vessel  Lesion #1: 80-90% Distal RCA just prior to bifurcation of rPDA and RPAV -- reduced to 0% stenosis; TIMI-3 flow pre-and post  Predilation Balloon: Trek 2.5 mm x 12 mm; 10 Atm x 31 Sec,   Stent: Promus Premier DES 3.0 mm x 16 mm; 12 Atm x 30 Sec --   The stent extends just distal to the RPAV takeoff as the lesion terminated just at the bifurcation.  Determine the best course of action to cross the RPAV, essentially jailing it while protecting it with the [employer] wire. After the initial stent dilation the [employer] wire was removed, and the RPAV was not compromised following post-dilation. It was actually easily wired when the pro-water wire was pullback into the RCA.   Post-dilation Balloon: [place] Trek 3.25  mm x 12 mm; 16 Atm x 45 Sec  Final Diameter: 3.3 mm  Shortly after the post dilation the patient had a brief episode of profound bradycardia into the 30s but easily resolved with coughing. This lasted maybe 15 seconds and resolve spontaneously. No atropine was administered.    Lesion #2: 60% followed by 80% Mid RCA in the region of the RV marginal branch; TIMI 3 flow pre and post  Predilation Balloon: Trek 2.5 mm x 12 mm; 10 Atm x 33 Sec,   Stent: Promus Premier DES 3.5 mm x 16 mm; 14 Atm x 45 Sec,16 Atm x 30 Sec  Post-dilation Balloon: [place] Trek 4.0 mm x 12 mm; 16 Atm x  45 Sec x2 inflations distal and proximal  Final Diameter: 4.0 mm  Post-deployment cineangiography with and without guidewire in place was performed in multiple orthogonal views demonstrating excellent stent deployment and did not reveal evidence of dissection or perforation.  There was some concern of proximal/ostial disruption from the guide catheter however when the guide was removed using the balloon for support and IV nitroglycerin was administered, this segment looked similar to the pre-PCI appearance.  During the PCI portion of the procedure the patient became agitated and complaining of diffuse itching and noted to have bright red discoloration of his skin. This was concerning for possible IVP contrast reaction, the patient was treated with IV Benadryl, IV Solu-Medrol and IV Pepcid. By the completion of the case the patient was totally a symptomatic with no further itching in his skin color SPECT normal. There was no evidence of any hemodynamic compromise.  PATIENT DISPOSITION:    The patient was transferred to the PACU holding area in a hemodynamicaly stable, chest pain free condition.  The patient tolerated the procedure well,  however, the patient had a brief episode of bradycardia likely related to temporary occlusion of the RPAV that provide perfusion to the AV nodal artery that was otherwise hemodynamically stable as well as a likely IVP contrast hypersensitivity reaction that responded well to therapy.   The patient  remained stable before, during, and after the procedure, despite the noted adverse reactions.   POST-OPERATIVE DIAGNOSIS:    Severe single-vessel CAD with 2 separate lesions in the mid mid and distal RCA.  Status post successful PCI of both lesions with a Promus Premier DES stents as described  Moderate disease noted in the OM 2 and mid LAD  Well-preserved EF with no significant wall motion abnormality noted.   Mildly elevated LVEDP  Likely IVP contrast reaction  that responded rapidly to early management with no adverse outcome.  PLAN OF CARE:  The patient will be transferred to the 6500 post procedure unit for post radial cath care.  Continue reduced rate of Angiomax for 2 hours post procedure.  Dual Antiplatelet Therapy for a minimum of 1 year.  Continue to titrate cardiac medications including statin, beta blocker and ACE inhibitor.  Treat IVP contrast reaction with 3 more doses of oral prednisone, and monitor for delayed reaction.  Anticipate discharge in the morning if stable.  He can followup with me in clinic after discharge for initial visit until he is able to establish care at the [facility].   Marykay Lex, M.D., M.S. [facility] [address] [place], [place]  [address]  [phone]  03/26/2012 12:42 AM

## 2012-03-26 NOTE — Progress Notes (Signed)
Utilization Review Completed.   Alton Bouknight, RN, BSN Nurse Case Manager  [phone]  

## 2013-06-11 ENCOUNTER — Other Ambulatory Visit (HOSPITAL_COMMUNITY): Payer: Self-pay | Admitting: Physician Assistant

## 2013-06-15 NOTE — Telephone Encounter (Signed)
Rx refill for Nitro sent to pharmacy

## 2013-09-27 ENCOUNTER — Observation Stay (HOSPITAL_COMMUNITY)
Admission: EM | Admit: 2013-09-27 | Discharge: 2013-09-28 | Disposition: A | Discharge status: Home / Self Care | Payer: Medicare Other | Attending: Family Medicine | Admitting: Family Medicine

## 2013-09-27 ENCOUNTER — Encounter (HOSPITAL_COMMUNITY): Payer: Self-pay | Admitting: Emergency Medicine

## 2013-09-27 ENCOUNTER — Emergency Department (HOSPITAL_COMMUNITY): Payer: Medicare Other

## 2013-09-27 DIAGNOSIS — Z881 Allergy status to other antibiotic agents status: Secondary | ICD-10-CM | POA: Insufficient documentation

## 2013-09-27 DIAGNOSIS — E1140 Type 2 diabetes mellitus with diabetic neuropathy, unspecified: Secondary | ICD-10-CM

## 2013-09-27 DIAGNOSIS — Z85118 Personal history of other malignant neoplasm of bronchus and lung: Secondary | ICD-10-CM | POA: Diagnosis not present

## 2013-09-27 DIAGNOSIS — Z888 Allergy status to other drugs, medicaments and biological substances status: Secondary | ICD-10-CM | POA: Insufficient documentation

## 2013-09-27 DIAGNOSIS — E785 Hyperlipidemia, unspecified: Secondary | ICD-10-CM | POA: Diagnosis not present

## 2013-09-27 DIAGNOSIS — Z7982 Long term (current) use of aspirin: Secondary | ICD-10-CM | POA: Diagnosis not present

## 2013-09-27 DIAGNOSIS — J40 Bronchitis, not specified as acute or chronic: Secondary | ICD-10-CM | POA: Diagnosis not present

## 2013-09-27 DIAGNOSIS — I252 Old myocardial infarction: Secondary | ICD-10-CM | POA: Insufficient documentation

## 2013-09-27 DIAGNOSIS — J441 Chronic obstructive pulmonary disease with (acute) exacerbation: Secondary | ICD-10-CM | POA: Diagnosis not present

## 2013-09-27 DIAGNOSIS — E1149 Type 2 diabetes mellitus with other diabetic neurological complication: Secondary | ICD-10-CM

## 2013-09-27 DIAGNOSIS — E7800 Pure hypercholesterolemia, unspecified: Secondary | ICD-10-CM | POA: Diagnosis present

## 2013-09-27 DIAGNOSIS — R079 Chest pain, unspecified: Secondary | ICD-10-CM | POA: Diagnosis not present

## 2013-09-27 DIAGNOSIS — E1142 Type 2 diabetes mellitus with diabetic polyneuropathy: Secondary | ICD-10-CM

## 2013-09-27 DIAGNOSIS — I10 Essential (primary) hypertension: Secondary | ICD-10-CM | POA: Diagnosis not present

## 2013-09-27 DIAGNOSIS — Z7902 Long term (current) use of antithrombotics/antiplatelets: Secondary | ICD-10-CM | POA: Diagnosis not present

## 2013-09-27 DIAGNOSIS — E119 Type 2 diabetes mellitus without complications: Secondary | ICD-10-CM | POA: Diagnosis not present

## 2013-09-27 DIAGNOSIS — Z79899 Other long term (current) drug therapy: Secondary | ICD-10-CM | POA: Insufficient documentation

## 2013-09-27 DIAGNOSIS — Z794 Long term (current) use of insulin: Secondary | ICD-10-CM | POA: Insufficient documentation

## 2013-09-27 DIAGNOSIS — F17200 Nicotine dependence, unspecified, uncomplicated: Secondary | ICD-10-CM | POA: Insufficient documentation

## 2013-09-27 DIAGNOSIS — Z9861 Coronary angioplasty status: Secondary | ICD-10-CM | POA: Diagnosis not present

## 2013-09-27 DIAGNOSIS — I214 Non-ST elevation (NSTEMI) myocardial infarction: Secondary | ICD-10-CM

## 2013-09-27 HISTORY — DX: Chronic obstructive pulmonary disease, unspecified: J44.9

## 2013-09-27 HISTORY — DX: Malignant (primary) neoplasm, unspecified: C80.1

## 2013-09-27 LAB — CBC
HCT: 53.8 % — ABNORMAL HIGH (ref 39.0–52.0)
HEMOGLOBIN: 18.7 g/dL — AB (ref 13.0–17.0)
MCH: 33 pg (ref 26.0–34.0)
MCHC: 34.8 g/dL (ref 30.0–36.0)
MCV: 95.1 fL (ref 78.0–100.0)
PLATELETS: 216 10*3/uL (ref 150–400)
RBC: 5.66 MIL/uL (ref 4.22–5.81)
RDW: 15.4 % (ref 11.5–15.5)
WBC: 11.8 10*3/uL — AB (ref 4.0–10.5)

## 2013-09-27 LAB — BASIC METABOLIC PANEL
ANION GAP: 18 — AB (ref 5–15)
BUN: 14 mg/dL (ref 6–23)
CHLORIDE: 91 meq/L — AB (ref 96–112)
CO2: 25 meq/L (ref 19–32)
CREATININE: 1.07 mg/dL (ref 0.50–1.35)
Calcium: 10.2 mg/dL (ref 8.4–10.5)
GFR calc Af Amer: 87 mL/min — ABNORMAL LOW (ref >90)
GFR calc non Af Amer: 75 mL/min — ABNORMAL LOW (ref >90)
GLUCOSE: 270 mg/dL — AB (ref 70–99)
Potassium: 4.1 mEq/L (ref 3.7–5.3)
SODIUM: 134 meq/L — AB (ref 137–147)

## 2013-09-27 LAB — PRO B NATRIURETIC PEPTIDE: PRO B NATRI PEPTIDE: 21.6 pg/mL (ref 0–125)

## 2013-09-27 LAB — I-STAT TROPONIN, ED: Troponin i, poc: 0 ng/mL (ref 0.00–0.08)

## 2013-09-27 LAB — TROPONIN I

## 2013-09-27 MED ORDER — INSULIN GLARGINE 100 UNIT/ML ~~LOC~~ SOLN
60.0000 [IU] | Freq: Every day | SUBCUTANEOUS | Status: DC
  Administered 2013-09-28: 60 [IU] via SUBCUTANEOUS
  Filled 2013-09-27 (×2): qty 0.6

## 2013-09-27 MED ORDER — MORPHINE SULFATE 4 MG/ML IJ SOLN
4.0000 mg | Freq: Once | INTRAMUSCULAR | Status: AC
  Administered 2013-09-27: 4 mg via INTRAVENOUS
  Filled 2013-09-27: qty 1

## 2013-09-27 NOTE — H&P (Signed)
PCP: [facility]   Chief Complaint:  Chest pain  HPI: Martin Thomas is a 58 y.o. male   has a past medical history of Hypertension; Diabetes mellitus without complication; Hyperlipemia; COPD (chronic obstructive pulmonary disease); and Cancer.   Presented with  Patient had NSTEMI in January 2014 sp stenting. He has been taking plavix daily but states did not follow up with cardiology. Patient have been coughing for the past few days. This PM he started to have episode of coughing spell after that he started to have bad chest pain in the center of his chest. It did not radiate like his prior MI. States pain is cough and deep breathing. No Shortness of breth chest pain free now. Troponin and ECG unremarkable. CXR non acute  Hospitalist was called for admission for chest pain  Review of Systems:    Pertinent positives include:   productive cough, night sweat  Constitutional:  No weight loss, night sweats, Fevers, chills, fatigue, weight loss  HEENT:  No headaches, Difficulty swallowing,Tooth/dental problems,Sore throat,  No sneezing, itching, ear ache, nasal congestion, post nasal drip,  Cardio-vascular:  No chest pain, Orthopnea, PND, anasarca, dizziness, palpitations.no Bilateral lower extremity swelling  GI:  No heartburn, indigestion, abdominal pain, nausea, vomiting, diarrhea, change in bowel habits, loss of appetite, melena, blood in stool, hematemesis Resp:  no shortness of breath at rest. No dyspnea on exertion, No excess mucus, no, No non-productive cough, No coughing up of blood.No change in color of mucus.No wheezing. Skin:  no rash or lesions. No jaundice GU:  no dysuria, change in color of urine, no urgency or frequency. No straining to urinate.  No flank pain.  Musculoskeletal:  No joint pain or no joint swelling. No decreased range of motion. No back pain.  Psych:  No change in mood or affect. No depression or anxiety. No memory loss.  Neuro: no localizing  neurological complaints, no tingling, no weakness, no double vision, no gait abnormality, no slurred speech, no confusion  Otherwise ROS are negative except for above, 10 systems were reviewed  Past Medical History: Past Medical History  Diagnosis Date  . Hypertension   . Diabetes mellitus without complication   . Hyperlipemia   . COPD (chronic obstructive pulmonary disease)   . Cancer     lung cancer   Past Surgical History  Procedure Laterality Date  . 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 Appendectomy    . Coronary stent placement       Medications: Prior to Admission medications   Medication Sig Start Date End Date Taking? Authorizing Provider  albuterol (PROVENTIL HFA;VENTOLIN HFA) 108 (90 BASE) MCG/ACT inhaler Inhale 2 puffs into the lungs every 6 (six) hours as needed for shortness of breath.    Yes Historical Provider, MD  aspirin EC 81 MG EC tablet Take 1 tablet (81 mg total) by mouth daily. 03/25/12  Yes Tarri Fuller, PA-C  atorvastatin (LIPITOR) 20 MG tablet Take 20 mg by mouth daily.   Yes Historical Provider, MD  budesonide-formoterol (SYMBICORT) 160-4.5 MCG/ACT inhaler Inhale 2 puffs into the lungs 2 (two) times daily.   Yes Historical Provider, MD  clopidogrel (PLAVIX) 75 MG tablet Take 75 mg by mouth daily.   Yes Historical Provider, MD  cyclobenzaprine (FLEXERIL) 10 MG tablet Take 10 mg by mouth at bedtime.   Yes Historical Provider, MD  fenofibrate (TRICOR) 145 MG tablet Take 145 mg by mouth daily.   Yes Historical Provider, MD  gabapentin (NEURONTIN) 300 MG capsule Take 300 mg by mouth 3 (three) times daily.   Yes Historical Provider, MD  HYDROcodone-acetaminophen (NORCO/VICODIN) 5-325 MG per tablet Take 1 tablet by mouth every 8 (eight) hours as needed. For pain   Yes Historical Provider, MD  insulin regular human CONCENTRATED (HUMULIN R) 500 UNIT/ML SOLN injection Inject 0.2 Units into the skin.   Yes Historical Provider, MD  lisinopril (PRINIVIL,ZESTRIL) 20  MG tablet Take 10 mg by mouth daily.    Yes Historical Provider, MD  magnesium oxide (MAG-OX) 400 MG tablet Take 400 mg by mouth 2 (two) times daily.   Yes Historical Provider, MD  metoprolol tartrate (LOPRESSOR) 25 MG tablet Take 1 tablet (25 mg total) by mouth 2 (two) times daily. 03/25/12  Yes Tarri Fuller, PA-C  nitroGLYCERIN (NITROSTAT) 0.4 MG SL tablet Place 0.4 mg under the tongue every 5 (five) minutes as needed for chest pain.   Yes Historical Provider, MD  omega-3 acid ethyl esters (LOVAZA) 1 G capsule Take 2 g by mouth 2 (two) times daily.   Yes Historical Provider, MD  omeprazole (PRILOSEC) 20 MG capsule Take 20 mg by mouth daily.   Yes Historical Provider, MD  ranitidine (ZANTAC) 150 MG capsule Take 150 mg by mouth 2 (two) times daily.   Yes Historical Provider, MD  senna (SENOKOT) 8.6 MG TABS Take 1 tablet by mouth daily.   Yes Historical Provider, MD  traZODone (DESYREL) 50 MG tablet Take 50 mg by mouth at bedtime.   Yes Historical Provider, MD  insulin glargine (LANTUS) 100 UNIT/ML injection Inject 60 Units into the skin 2 (two) times daily. 03/25/12 03/25/13  Tarri Fuller, PA-C    Allergies:   Allergies  Allergen Reactions  . Contrast Media [Iodinated Diagnostic Agents] Anaphylaxis  . 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  He has been smoking about 1.50 packs per day. He does not have any smokeless tobacco history on file. He reports that he does not drink alcohol or use illicit drugs.    Family History: family history includes CAD in an other family member; Heart attack in his father and mother; Heart attack (age of onset: 83) in his paternal uncle.    Physical Exam: Patient Vitals for the past 24 hrs:  BP Temp Temp src Pulse Resp SpO2 Height Weight  09/27/13 2045 119/79 mmHg - - 102 16 - - -  09/27/13 2018 - - - - - 92 % - -  09/27/13  2017 144/94 mmHg 98.4 F (36.9 C) Oral 108 18 99 % 5\' 9"  (1.753 m) 97.523 kg (215 lb)    1. General:  in No Acute distress 2. Psychological: Alert and   Oriented 3. Head/ENT:   Moist  Mucous Membranes                          Head Non traumatic, neck supple                          Normal   Dentition 4. SKIN: normal  Skin turgor,  Skin clean Dry and intact no rash 5. Heart: Regular rate and rhythm no Murmur, Rub or gallop 6. Lungs: Clear to auscultation bilaterally, no wheezes or crackles   7.  Abdomen: Soft, non-tender, Non distended 8. Lower extremities: no clubbing, cyanosis, or edema 9. Neurologically Grossly intact, moving all 3 extremities equally 10. MSK: Normal range of motion  body mass index is 31.74 kg/(m^2).   Labs on Admission:   Recent Labs  09/27/13 2041  NA 134*  K 4.1  CL 91*  CO2 25  GLUCOSE 270*  BUN 14  CREATININE 1.07  CALCIUM 10.2   No results found for this basename: AST, ALT, ALKPHOS, BILITOT, PROT, ALBUMIN,  in the last 72 hours No results found for this basename: LIPASE, AMYLASE,  in the last 72 hours  Recent Labs  09/27/13 2041  WBC 11.8*  HGB 18.7*  HCT 53.8*  MCV 95.1  PLT 216    Recent Labs  09/27/13 2041  TROPONINI <0.30   No results found for this basename: TSH, T4TOTAL, FREET3, T3FREE, THYROIDAB,  in the last 72 hours No results found for this basename: VITAMINB12, FOLATE, FERRITIN, TIBC, IRON, RETICCTPCT,  in the last 72 hours Lab Results  Component Value Date   HGBA1C 7.6* 03/24/2012    Estimated Creatinine Clearance: 87.7 ml/min (by C-G formula based on Cr of 1.07). ABG No results found for this basename: phart, pco2, po2, hco3, tco2, acidbasedef, o2sat     No results found for this basename: DDIMER     Other results:  I have pearsonaly reviewed this: ECG REPORT  Rate:125  Rhythm: ectopic tachycardia ST&T Change: no ischemia   BNP (last 3 results)  Recent Labs  09/27/13 2041  PROBNP 21.6    Filed  Weights   09/27/13 2017  Weight: 97.523 kg (215 lb)     Cultures: No results found for this basename: sdes, specrequest, cult, reptstatus    Radiological Exams on Admission: Dg Chest Port 1 View  09/27/2013   CLINICAL DATA:  Chest pain  EXAM: PORTABLE CHEST - 1 VIEW  COMPARISON:  03/23/2012  FINDINGS: The heart size and mediastinal contours are within normal limits. Both lungs are clear. The visualized skeletal structures are unremarkable.  IMPRESSION: No active disease.   Electronically Signed   By: Kathreen Devoid   On: 09/27/2013 21:26    Chart has been reviewed  Assessment/Plan  58 yo w hx of CAD here with atypical chest pain worse with coughing and deep breaths  Present on Admission:  . Chest pain - atypical but given risk factors will admit, cycle CE obtain d.dimer serial ECG, echo to eval for wall motion anormality keep NPO in case needs further studies . HTN (hypertension) - continue home meds . DM2 (diabetes mellitus, type 2) - SSI, hold HUmalin 500 instead order lantus 60 . High cholesterol - cont home meds . Bronchitis - xopenex and atrovent nebs, mucinex, no significant wheezing.      Prophylaxis:  Lovenox, Protonix  CODE STATUS:  FULL CODE   Other plan as per orders.  I have spent a total of 55 min on this admission  Safira Proffit 09/27/2013, 10:39 PM  [facility]  Pager [phone]   If 7AM-7PM, please contact the day team taking care of the patient  [email or web address]  Password [email or web address]

## 2013-09-27 NOTE — ED Notes (Signed)
Pt arrives via EMS with c/o chest pain. States he has chronic chest pains but today pain was "different" in addition to [facility]. Pt took 324 MG aspirin and 2 SL nitro PTA, which where effective. States he doesn't have pain at this time unless he takes a deep breath.

## 2013-09-27 NOTE — ED Provider Notes (Signed)
CSN: [id number]     Arrival date & time 09/27/13  2009 History   First MD Initiated Contact with Patient 09/27/13 2019     Chief Complaint  Patient presents with  . Chest Pain     (Consider location/radiation/quality/duration/timing/severity/associated sxs/prior Treatment) HPI Comments: Patient is a 58 yo M PMHx significant for HTN, DM, HLD, COPD, Hx MI presenting to the ED for left sided non-radiating chest pain with associated SOB that began around 7PM. Patient states he had similar CP last year with his NSTEMI, but no radiation this time. His pain improved after 324mg  ASA and 2 SL Nitro given by EMS. Rates his pain 2/10 currently. Patient does not wear oxygen at home. He has not followed up with a cardiologist at the [facility] since his NSTEMI. No cardiac catheterizations since Jan. 2014.    Past Medical History  Diagnosis Date  . Hypertension   . Diabetes mellitus without complication   . Hyperlipemia   . COPD (chronic obstructive pulmonary disease)   . Cancer     lung cancer   Past Surgical History  Procedure Laterality Date  . 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 Appendectomy    . Coronary stent placement     Family History  Problem Relation Age of Onset  . CAD    . Heart attack Father     died of second MI at 33  . Heart attack Mother   . Heart attack Paternal Uncle 62    died of MI at 70   History  Substance Use Topics  . Smoking status: Current Every Day Smoker -- 1.50 packs/day    Types: Cigarettes    Last Attempt to Quit: 01/17/2012  . Smokeless tobacco: Not on file  . Alcohol Use: No    Review of Systems  Respiratory: Positive for cough and shortness of breath.   Cardiovascular: Positive for chest pain.  All other systems reviewed and are negative.     Allergies  Contrast media and Ciprofloxacin  Home Medications   Prior to Admission medications   Medication Sig Start Date End Date Taking? Authorizing Provider  albuterol (PROVENTIL HFA;VENTOLIN  HFA) 108 (90 BASE) MCG/ACT inhaler Inhale 2 puffs into the lungs every 6 (six) hours as needed for shortness of breath.    Yes Historical Provider, MD  aspirin EC 81 MG EC tablet Take 1 tablet (81 mg total) by mouth daily. 03/25/12  Yes Tarri Fuller, PA-C  atorvastatin (LIPITOR) 20 MG tablet Take 20 mg by mouth daily.   Yes Historical Provider, MD  budesonide-formoterol (SYMBICORT) 160-4.5 MCG/ACT inhaler Inhale 2 puffs into the lungs 2 (two) times daily.   Yes Historical Provider, MD  clopidogrel (PLAVIX) 75 MG tablet Take 75 mg by mouth daily.   Yes Historical Provider, MD  cyclobenzaprine (FLEXERIL) 10 MG tablet Take 10 mg by mouth at bedtime.   Yes Historical Provider, MD  fenofibrate (TRICOR) 145 MG tablet Take 145 mg by mouth daily.   Yes Historical Provider, MD  gabapentin (NEURONTIN) 300 MG capsule Take 300 mg by mouth 3 (three) times daily.   Yes Historical Provider, MD  HYDROcodone-acetaminophen (NORCO/VICODIN) 5-325 MG per tablet Take 1 tablet by mouth every 8 (eight) hours as needed. For pain   Yes Historical Provider, MD  insulin regular human CONCENTRATED (HUMULIN R) 500 UNIT/ML SOLN injection Inject 0.2 Units into the skin.   Yes Historical Provider, MD  lisinopril (PRINIVIL,ZESTRIL) 20 MG tablet Take 20 mg by  mouth daily.   Yes Historical Provider, MD  magnesium oxide (MAG-OX) 400 MG tablet Take 400 mg by mouth 2 (two) times daily.   Yes Historical Provider, MD  metoprolol tartrate (LOPRESSOR) 25 MG tablet Take 1 tablet (25 mg total) by mouth 2 (two) times daily. 03/25/12  Yes Tarri Fuller, PA-C  nitroGLYCERIN (NITROSTAT) 0.4 MG SL tablet Place 0.4 mg under the tongue every 5 (five) minutes as needed for chest pain.   Yes Historical Provider, MD  omega-3 acid ethyl esters (LOVAZA) 1 G capsule Take 2 g by mouth 2 (two) times daily.   Yes Historical Provider, MD  omeprazole (PRILOSEC) 20 MG capsule Take 20 mg by mouth daily.   Yes Historical Provider, MD  ranitidine (ZANTAC) 150 MG capsule  Take 150 mg by mouth 2 (two) times daily.   Yes Historical Provider, MD  senna (SENOKOT) 8.6 MG TABS Take 1 tablet by mouth daily.   Yes Historical Provider, MD  traZODone (DESYREL) 50 MG tablet Take 50 mg by mouth at bedtime.   Yes Historical Provider, MD  insulin glargine (LANTUS) 100 UNIT/ML injection Inject 60 Units into the skin 2 (two) times daily. 03/25/12 03/25/13  Tarri Fuller, PA-C   BP 119/79  Pulse 102  Temp(Src) 98.4 F (36.9 C) (Oral)  Resp 16  Ht 5\' 9"  (1.753 m)  Wt 215 lb (97.523 kg)  BMI 31.74 kg/m2  SpO2 92% Physical Exam  Nursing note and vitals reviewed. Constitutional: He is oriented to person, place, and time. He appears well-developed and well-nourished. No distress.  HENT:  Head: Normocephalic and atraumatic.  Right Ear: External ear normal.  Left Ear: External ear normal.  Nose: Nose normal.  Eyes: Conjunctivae are normal.  Neck: Neck supple.  Cardiovascular: Normal rate, regular rhythm, normal heart sounds and intact distal pulses.   Pulmonary/Chest: Effort normal and breath sounds normal. No respiratory distress. He has no wheezes. He exhibits no tenderness.  Abdominal: Soft. Bowel sounds are normal. There is no tenderness.  Musculoskeletal: He exhibits no edema.  Left BKA  Neurological: He is alert and oriented to person, place, and time. GCS eye subscore is 4. GCS verbal subscore is 5. GCS motor subscore is 6.  Moves all extremities w/o ataxia.   Skin: Skin is warm and dry. He is not diaphoretic. No erythema.    ED Course  Procedures (including critical care time) Medications  morphine 4 MG/ML injection 4 mg (4 mg Intravenous Given 09/27/13 2035)    Labs Review Labs Reviewed  CBC - Abnormal; Notable for the following:    WBC 11.8 (*)    Hemoglobin 18.7 (*)    HCT 53.8 (*)    All other components within normal limits  BASIC METABOLIC PANEL - Abnormal; Notable for the following:    Sodium 134 (*)    Chloride 91 (*)    Glucose, Bld 270 (*)    GFR  calc non Af Amer 75 (*)    GFR calc Af Amer 87 (*)    Anion gap 18 (*)    All other components within normal limits  PRO B NATRIURETIC PEPTIDE  TROPONIN I  I-STAT TROPOININ, ED    Imaging Review Dg Chest Port 1 View  09/27/2013   CLINICAL DATA:  Chest pain  EXAM: PORTABLE CHEST - 1 VIEW  COMPARISON:  03/23/2012  FINDINGS: The heart size and mediastinal contours are within normal limits. Both lungs are clear. The visualized skeletal structures are unremarkable.  IMPRESSION: No active disease.  Electronically Signed   By: Kathreen Devoid   On: 09/27/2013 21:26     EKG Interpretation   Date/Time:  Monday September 27 2013 20:21:37 EDT Ventricular Rate:  125 PR Interval:  151 QRS Duration: 95 QT Interval:  336 QTC Calculation: 484 R Axis:   -119 Text Interpretation:  Ectopic atrial tachycardia, unifocal Ventricular  premature complex Left anterior fascicular block Low voltage, extremity  and precordial leads Similar to prior Confirmed by Mingo Amber  MD, [place]  ([id number]) on 09/27/2013 8:24:15 PM      MDM   Final diagnoses:  Chest pain, unspecified chest pain type    Filed Vitals:   09/27/13 2045  BP: 119/79  Pulse: 102  Temp:   Resp: 16   Afebrile, NAD, non-toxic appearing, AAOx4.   Concern for cardiac etiology of Chest Pain. Hospitalist has been consulted and will see patient in the ED for likely admit. Pt does not meet criteria for CP protocol and a further evaluation is recommended. Pt has been re-evaluated prior to consult and VSS, NAD, heart RRR, pain 0/10, lungs CTAB. No acute abnormalities found on EKG and first round of cardiac enzymes negative. This case was discussed with Dr. Mingo Amber who has seen the patient and agrees with plan to admit.      Harlow Mares, PA-C 09/28/13 0019

## 2013-09-28 ENCOUNTER — Encounter (HOSPITAL_COMMUNITY): Payer: Self-pay | Admitting: General Practice

## 2013-09-28 DIAGNOSIS — R079 Chest pain, unspecified: Secondary | ICD-10-CM | POA: Diagnosis not present

## 2013-09-28 DIAGNOSIS — I10 Essential (primary) hypertension: Secondary | ICD-10-CM | POA: Diagnosis not present

## 2013-09-28 DIAGNOSIS — J441 Chronic obstructive pulmonary disease with (acute) exacerbation: Secondary | ICD-10-CM | POA: Diagnosis not present

## 2013-09-28 DIAGNOSIS — J40 Bronchitis, not specified as acute or chronic: Secondary | ICD-10-CM | POA: Diagnosis not present

## 2013-09-28 DIAGNOSIS — I252 Old myocardial infarction: Secondary | ICD-10-CM | POA: Diagnosis not present

## 2013-09-28 LAB — TROPONIN I: Troponin I: <0.3 ng/mL (ref <0.30)

## 2013-09-28 LAB — GLUCOSE, CAPILLARY
GLUCOSE-CAPILLARY: 233 mg/dL — AB (ref 70–99)
GLUCOSE-CAPILLARY: 276 mg/dL — AB (ref 70–99)
Glucose-Capillary: 282 mg/dL — ABNORMAL HIGH (ref 70–99)
Glucose-Capillary: 291 mg/dL — ABNORMAL HIGH (ref 70–99)

## 2013-09-28 LAB — CREATININE, SERUM
Creatinine, Ser: 1.07 mg/dL (ref 0.50–1.35)
GFR calc non Af Amer: 75 mL/min — ABNORMAL LOW (ref >90)
GFR, EST AFRICAN AMERICAN: 87 mL/min — AB (ref >90)

## 2013-09-28 LAB — CBC
HCT: 53.1 % — ABNORMAL HIGH (ref 39.0–52.0)
Hemoglobin: 18.4 g/dL — ABNORMAL HIGH (ref 13.0–17.0)
MCH: 33 pg (ref 26.0–34.0)
MCHC: 34.7 g/dL (ref 30.0–36.0)
MCV: 95.2 fL (ref 78.0–100.0)
PLATELETS: 229 10*3/uL (ref 150–400)
RBC: 5.58 MIL/uL (ref 4.22–5.81)
RDW: 15.8 % — AB (ref 11.5–15.5)
WBC: 12.5 10*3/uL — AB (ref 4.0–10.5)

## 2013-09-28 LAB — HEMOGLOBIN A1C
Hgb A1c MFr Bld: 9.7 % — ABNORMAL HIGH (ref <5.7)
Mean Plasma Glucose: 232 mg/dL — ABNORMAL HIGH (ref <117)

## 2013-09-28 LAB — D-DIMER, QUANTITATIVE (NOT AT ARMC): D-Dimer, Quant: <0.27 ug/mL-FEU (ref 0.00–0.48)

## 2013-09-28 MED ORDER — METOPROLOL TARTRATE 25 MG PO TABS
25.0000 mg | ORAL_TABLET | Freq: Two times a day (BID) | ORAL | Status: DC
  Administered 2013-09-28: 25 mg via ORAL
  Filled 2013-09-28 (×2): qty 1

## 2013-09-28 MED ORDER — ASPIRIN EC 81 MG PO TBEC
81.0000 mg | DELAYED_RELEASE_TABLET | Freq: Every day | ORAL | Status: DC
  Administered 2013-09-28: 81 mg via ORAL
  Filled 2013-09-28: qty 1

## 2013-09-28 MED ORDER — TRAZODONE HCL 50 MG PO TABS
50.0000 mg | ORAL_TABLET | Freq: Every day | ORAL | Status: DC
  Administered 2013-09-28: 50 mg via ORAL
  Filled 2013-09-28 (×2): qty 1

## 2013-09-28 MED ORDER — GUAIFENESIN ER 600 MG PO TB12
600.0000 mg | ORAL_TABLET | Freq: Two times a day (BID) | ORAL | Status: DC
  Administered 2013-09-28 (×2): 600 mg via ORAL
  Filled 2013-09-28 (×3): qty 1

## 2013-09-28 MED ORDER — FAMOTIDINE 20 MG PO TABS
20.0000 mg | ORAL_TABLET | Freq: Every day | ORAL | Status: DC
  Administered 2013-09-28: 20 mg via ORAL
  Filled 2013-09-28: qty 1

## 2013-09-28 MED ORDER — BUDESONIDE-FORMOTEROL FUMARATE 160-4.5 MCG/ACT IN AERO: 2.0000 | INHALATION_SPRAY | Freq: Two times a day (BID) | RESPIRATORY_TRACT | Status: DC

## 2013-09-28 MED ORDER — ENOXAPARIN SODIUM 40 MG/0.4ML ~~LOC~~ SOLN
40.0000 mg | SUBCUTANEOUS | Status: DC
  Administered 2013-09-28: 40 mg via SUBCUTANEOUS
  Filled 2013-09-28: qty 0.4

## 2013-09-28 MED ORDER — IPRATROPIUM BROMIDE 0.02 % IN SOLN
0.5000 mg | Freq: Four times a day (QID) | RESPIRATORY_TRACT | Status: DC
  Administered 2013-09-28 (×2): 0.5 mg via RESPIRATORY_TRACT
  Filled 2013-09-28 (×2): qty 2.5

## 2013-09-28 MED ORDER — LEVALBUTEROL HCL 0.63 MG/3ML IN NEBU
0.6300 mg | INHALATION_SOLUTION | RESPIRATORY_TRACT | Status: DC | PRN
Start: 2013-09-28 — End: 2013-09-28

## 2013-09-28 MED ORDER — LISINOPRIL 10 MG PO TABS
10.0000 mg | ORAL_TABLET | Freq: Every day | ORAL | Status: DC
  Administered 2013-09-28: 10 mg via ORAL
  Filled 2013-09-28: qty 1

## 2013-09-28 MED ORDER — FENOFIBRATE 160 MG PO TABS
160.0000 mg | ORAL_TABLET | Freq: Every day | ORAL | Status: DC
  Administered 2013-09-28: 160 mg via ORAL
  Filled 2013-09-28: qty 1

## 2013-09-28 MED ORDER — CLOPIDOGREL BISULFATE 75 MG PO TABS
75.0000 mg | ORAL_TABLET | Freq: Every day | ORAL | Status: DC
  Administered 2013-09-28: 75 mg via ORAL
  Filled 2013-09-28: qty 1

## 2013-09-28 MED ORDER — MORPHINE SULFATE 2 MG/ML IJ SOLN: 2.0000 mg | INTRAMUSCULAR | Status: DC | PRN

## 2013-09-28 MED ORDER — GABAPENTIN 300 MG PO CAPS
300.0000 mg | ORAL_CAPSULE | Freq: Three times a day (TID) | ORAL | Status: DC
  Administered 2013-09-28: 300 mg via ORAL
  Filled 2013-09-28 (×3): qty 1

## 2013-09-28 MED ORDER — SENNA 8.6 MG PO TABS
1.0000 | ORAL_TABLET | Freq: Every day | ORAL | Status: DC
  Administered 2013-09-28: 8.6 mg via ORAL
  Filled 2013-09-28: qty 1

## 2013-09-28 MED ORDER — ALBUTEROL SULFATE HFA 108 (90 BASE) MCG/ACT IN AERS: 2.0000 | INHALATION_SPRAY | Freq: Four times a day (QID) | RESPIRATORY_TRACT | Status: DC | PRN

## 2013-09-28 MED ORDER — MOMETASONE FURO-FORMOTEROL FUM 100-5 MCG/ACT IN AERO
2.0000 | INHALATION_SPRAY | Freq: Two times a day (BID) | RESPIRATORY_TRACT | Status: DC
  Administered 2013-09-28: 2 via RESPIRATORY_TRACT
  Filled 2013-09-28: qty 8.8

## 2013-09-28 MED ORDER — MAGNESIUM OXIDE 400 (241.3 MG) MG PO TABS
400.0000 mg | ORAL_TABLET | Freq: Two times a day (BID) | ORAL | Status: DC
  Administered 2013-09-28: 400 mg via ORAL
  Filled 2013-09-28 (×2): qty 1

## 2013-09-28 MED ORDER — ACETAMINOPHEN 325 MG PO TABS: 650.0000 mg | ORAL_TABLET | ORAL | Status: DC | PRN

## 2013-09-28 MED ORDER — CYCLOBENZAPRINE HCL 10 MG PO TABS
10.0000 mg | ORAL_TABLET | Freq: Every day | ORAL | Status: DC
  Administered 2013-09-28: 10 mg via ORAL
  Filled 2013-09-28 (×2): qty 1

## 2013-09-28 MED ORDER — ONDANSETRON HCL 4 MG/2ML IJ SOLN: 4.0000 mg | Freq: Four times a day (QID) | INTRAMUSCULAR | Status: DC | PRN

## 2013-09-28 MED ORDER — HYDROCODONE-ACETAMINOPHEN 5-325 MG PO TABS: 1.0000 | ORAL_TABLET | Freq: Four times a day (QID) | ORAL | Status: DC | PRN

## 2013-09-28 MED ORDER — INSULIN ASPART 100 UNIT/ML ~~LOC~~ SOLN
0.0000 [IU] | SUBCUTANEOUS | Status: DC
  Administered 2013-09-28: 5 [IU] via SUBCUTANEOUS
  Administered 2013-09-28: 3 [IU] via SUBCUTANEOUS
  Administered 2013-09-28 (×2): 5 [IU] via SUBCUTANEOUS

## 2013-09-28 MED ORDER — PANTOPRAZOLE SODIUM 40 MG PO TBEC
40.0000 mg | DELAYED_RELEASE_TABLET | Freq: Every day | ORAL | Status: DC
  Administered 2013-09-28: 40 mg via ORAL
  Filled 2013-09-28: qty 1

## 2013-09-28 MED ORDER — ATORVASTATIN CALCIUM 20 MG PO TABS
20.0000 mg | ORAL_TABLET | Freq: Every day | ORAL | Status: DC
  Administered 2013-09-28: 20 mg via ORAL
  Filled 2013-09-28: qty 1

## 2013-09-28 NOTE — Progress Notes (Signed)
Inpatient Diabetes Program Recommendations  AACE/ADA: New Consensus Statement on Inpatient Glycemic Control (2013)  Target Ranges:  Prepandial:   less than 140 mg/dL      Peak postprandial:   less than 180 mg/dL (1-2 hours)      Critically ill patients:  140 - 180 mg/dL     Results for Martin Thomas, Martin Thomas (MRN [id number]) as of 09/28/2013 13:34  Ref. Range 09/28/2013 00:24 09/28/2013 04:13 09/28/2013 07:45 09/28/2013 11:48  Glucose-Capillary Latest Range: 70-99 mg/dL 282 (H) 291 (H) 233 (H) 276 (H)     Admit with CP.  History of DM, HTN, COPD.  [facility] [place] [facility] DM Meds:  U-500 insulin (0.2 ml breakfast and lunch and 0.22 ml supper)- (these doses are equivalent to 100 and 125 units U-100 insulin)    **Note patient started on Lantus 60 units QHS + Novolog Sensitive SSI.  CBGs quite elevated.  Patient likely needs more insulin if he is not discharged home today.   Recommend the following if patient not discharged home today:  1. Increase Lantus to 80 units QHS 2. Add Novolog Meal Coverage- Novolog 10 units tid with meals to start 3. Patient can restart U-500 insulin at discharge    Will follow Wyn Quaker RN, MSN, CDE Diabetes Coordinator Inpatient Diabetes Program Team Pager: [phone] (8a-10p)

## 2013-09-28 NOTE — Progress Notes (Signed)
No further chest pain. Pain was related to coughing. He has been compliant with medications, but did not follow-up with cardiology at the [facility]. First cardiac enzyme was negative. Await second troponin. If negative, no further testing is indicated at this time. Can eat today, I have written for a diet. Call with questions.  Pixie Casino, MD, [facility] Attending Cardiologist [place]

## 2013-09-28 NOTE — Discharge Summary (Signed)
Physician Discharge Summary  Martin Thomas ZOX:[id number] DOB: [birth year] DOA: 09/27/2013  PCP: Default, Provider, MD  Admit date: 09/27/2013 Discharge date: 09/28/2013  Time spent: > 35 minutes  Recommendations for Outpatient Follow-up:  1. Please be sure to follow up with your primary care physician in 1-2 weeks or sooner should any new concerns arise  Discharge Diagnoses:  Active Problems:   DM2 (diabetes mellitus, type 2)   HTN (hypertension)   High cholesterol   Chest pain   Discharge Condition: stable  Diet recommendation: low sodium heart healthy  Filed Weights   09/27/13 2017 09/28/13 0030 09/28/13 0423  Weight: 97.523 kg (215 lb) 96.6 kg (212 lb 15.4 oz) 96.6 kg (212 lb 15.4 oz)    History of present illness:  Pt is a 58 y/o with PMH of DM, HPL, HTN, COPD and NSTEMI in January 2014 s/p stenting who presented this admission complaining of chest pain.  Hospital Course:  Chest pain - Cardiology consulted and recommended no further work up - Cardiac enzymes negative - Could be 2ary to pleuritic chest pain as pain is worse with cough and deep breathing, chest x ray reporting no active disease  DM - Pt to continue home regimen and diabetic diet. - recommend patient close f/u with pcp for further monitoring of blood sugars  HTN - stable continue home regimen.  Procedures:  None  Consultations:  Cardiology  Discharge Exam: Filed Vitals:   09/28/13 1155  BP: 134/95  Pulse: 85  Temp: 98.3 F (36.8 C)  Resp: 18    General: Pt in NAD, alert and awake Cardiovascular: rrr, no mrg Respiratory: CTA BL, no wheezes  Discharge Instructions You were cared for by a hospitalist during your hospital stay. If you have any questions about your discharge medications or the care you received while you were in the hospital after you are discharged, you can call the unit and asked to speak with the hospitalist on call if the hospitalist that took care of you is not  available. Once you are discharged, your primary care physician will handle any further medical issues. Please note that NO REFILLS for any discharge medications will be authorized once you are discharged, as it is imperative that you return to your primary care physician (or establish a relationship with a primary care physician if you do not have one) for your aftercare needs so that they can reassess your need for medications and monitor your lab values.  Discharge Instructions   Call MD for:  severe uncontrolled pain    Complete by:  As directed      Call MD for:  temperature >100.4    Complete by:  As directed      Diet - low sodium heart healthy    Complete by:  As directed      Increase activity slowly    Complete by:  As directed             Medication List         albuterol 108 (90 BASE) MCG/ACT inhaler  Commonly known as:  PROVENTIL HFA;VENTOLIN HFA  Inhale 2 puffs into the lungs every 6 (six) hours as needed for shortness of breath.     aspirin 81 MG EC tablet  Take 1 tablet (81 mg total) by mouth daily.     atorvastatin 20 MG tablet  Commonly known as:  LIPITOR  Take 20 mg by mouth daily.     budesonide-formoterol 160-4.5 MCG/ACT inhaler  Commonly known  as:  SYMBICORT  Inhale 2 puffs into the lungs 2 (two) times daily.     clopidogrel 75 MG tablet  Commonly known as:  PLAVIX  Take 75 mg by mouth daily.     cyclobenzaprine 10 MG tablet  Commonly known as:  FLEXERIL  Take 10 mg by mouth at bedtime.     fenofibrate 145 MG tablet  Commonly known as:  TRICOR  Take 145 mg by mouth daily.     gabapentin 300 MG capsule  Commonly known as:  NEURONTIN  Take 300 mg by mouth 3 (three) times daily.     HUMULIN R 500 UNIT/ML Soln injection  Generic drug:  insulin regular human CONCENTRATED  Inject 0.2 Units into the skin. 0.2 at breakfast and lunch and 0.22 in the evening     HYDROcodone-acetaminophen 5-325 MG per tablet  Commonly known as:  NORCO/VICODIN  Take 1  tablet by mouth every 8 (eight) hours as needed. For pain     lisinopril 20 MG tablet  Commonly known as:  PRINIVIL,ZESTRIL  Take 10 mg by mouth daily.     magnesium oxide 400 MG tablet  Commonly known as:  MAG-OX  Take 400 mg by mouth 2 (two) times daily.     metoprolol tartrate 25 MG tablet  Commonly known as:  LOPRESSOR  Take 1 tablet (25 mg total) by mouth 2 (two) times daily.     nitroGLYCERIN 0.4 MG SL tablet  Commonly known as:  NITROSTAT  Place 0.4 mg under the tongue every 5 (five) minutes as needed for chest pain.     omega-3 acid ethyl esters 1 G capsule  Commonly known as:  LOVAZA  Take 2 g by mouth 2 (two) times daily.     omeprazole 20 MG capsule  Commonly known as:  PRILOSEC  Take 20 mg by mouth daily.     ranitidine 150 MG capsule  Commonly known as:  ZANTAC  Take 150 mg by mouth 2 (two) times daily.     senna 8.6 MG Tabs tablet  Commonly known as:  SENOKOT  Take 1 tablet by mouth daily.     traZODone 50 MG tablet  Commonly known as:  DESYREL  Take 50 mg by mouth at bedtime.       Allergies  Allergen Reactions  . Contrast Media [Iodinated Diagnostic Agents] Anaphylaxis  . Ciprofloxacin     unknown      The results of significant diagnostics from this hospitalization (including imaging, microbiology, ancillary and laboratory) are listed below for reference.    Significant Diagnostic Studies: Dg Chest Port 1 View  09/27/2013   CLINICAL DATA:  Chest pain  EXAM: PORTABLE CHEST - 1 VIEW  COMPARISON:  03/23/2012  FINDINGS: The heart size and mediastinal contours are within normal limits. Both lungs are clear. The visualized skeletal structures are unremarkable.  IMPRESSION: No active disease.   Electronically Signed   By: Kathreen Devoid   On: 09/27/2013 21:26    Microbiology: No results found for this or any previous visit (from the past 240 hour(s)).   Labs: Basic Metabolic Panel:  Recent Labs Lab 09/27/13 2041 09/28/13 0134  NA 134*  --   K  4.1  --   CL 91*  --   CO2 25  --   GLUCOSE 270*  --   BUN 14  --   CREATININE 1.07 1.07  CALCIUM 10.2  --    Liver Function Tests: No results found for this  basename: AST, ALT, ALKPHOS, BILITOT, PROT, ALBUMIN,  in the last 168 hours No results found for this basename: LIPASE, AMYLASE,  in the last 168 hours No results found for this basename: AMMONIA,  in the last 168 hours CBC:  Recent Labs Lab 09/27/13 2041 09/28/13 0134  WBC 11.8* 12.5*  HGB 18.7* 18.4*  HCT 53.8* 53.1*  MCV 95.1 95.2  PLT 216 229   Cardiac Enzymes:  Recent Labs Lab 09/27/13 2041 09/28/13 1017  TROPONINI <0.30 <0.30   BNP: BNP (last 3 results)  Recent Labs  09/27/13 2041  PROBNP 21.6   CBG:  Recent Labs Lab 09/28/13 0024 09/28/13 0413 09/28/13 0745 09/28/13 1148  GLUCAP 282* 291* 233* 276*       Signed:  Velvet Bathe  [facility] 09/28/2013, 2:31 PM

## 2013-09-29 NOTE — ED Provider Notes (Signed)
Medical screening examination/treatment/procedure(s) were conducted as a shared visit with non-physician practitioner(s) and myself.  I personally evaluated the patient during the encounter.   EKG Interpretation   Date/Time:  Monday September 27 2013 20:21:37 EDT Ventricular Rate:  125 PR Interval:  151 QRS Duration: 95 QT Interval:  336 QTC Calculation: 484 R Axis:   -119 Text Interpretation:  Ectopic atrial tachycardia, unifocal Ventricular  premature complex Left anterior fascicular block Low voltage, extremity  and precordial leads Similar to prior Confirmed by Mingo Amber  MD, [place]  ([id number]) on 09/27/2013 8:24:15 PM      Patient here with CP. Concern for ACS. CP free here, but earlier had pressure-like pain alleviated with NTG. EKG ok. ADmitted to Cards.  Osvaldo Shipper, MD 09/29/13 [phone]

## 2013-11-09 DIAGNOSIS — M546 Pain in thoracic spine: Secondary | ICD-10-CM | POA: Diagnosis not present

## 2013-11-09 DIAGNOSIS — M531 Cervicobrachial syndrome: Secondary | ICD-10-CM | POA: Diagnosis not present

## 2013-11-09 DIAGNOSIS — M999 Biomechanical lesion, unspecified: Secondary | ICD-10-CM | POA: Diagnosis not present

## 2013-11-09 DIAGNOSIS — M9981 Other biomechanical lesions of cervical region: Secondary | ICD-10-CM | POA: Diagnosis not present

## 2013-11-12 DIAGNOSIS — M999 Biomechanical lesion, unspecified: Secondary | ICD-10-CM | POA: Diagnosis not present

## 2013-11-12 DIAGNOSIS — M9981 Other biomechanical lesions of cervical region: Secondary | ICD-10-CM | POA: Diagnosis not present

## 2013-11-12 DIAGNOSIS — M546 Pain in thoracic spine: Secondary | ICD-10-CM | POA: Diagnosis not present

## 2013-11-12 DIAGNOSIS — M531 Cervicobrachial syndrome: Secondary | ICD-10-CM | POA: Diagnosis not present

## 2013-11-18 DIAGNOSIS — M999 Biomechanical lesion, unspecified: Secondary | ICD-10-CM | POA: Diagnosis not present

## 2013-11-18 DIAGNOSIS — M9981 Other biomechanical lesions of cervical region: Secondary | ICD-10-CM | POA: Diagnosis not present

## 2013-11-18 DIAGNOSIS — M531 Cervicobrachial syndrome: Secondary | ICD-10-CM | POA: Diagnosis not present

## 2013-11-18 DIAGNOSIS — M546 Pain in thoracic spine: Secondary | ICD-10-CM | POA: Diagnosis not present

## 2013-11-23 DIAGNOSIS — M531 Cervicobrachial syndrome: Secondary | ICD-10-CM | POA: Diagnosis not present

## 2013-11-23 DIAGNOSIS — M999 Biomechanical lesion, unspecified: Secondary | ICD-10-CM | POA: Diagnosis not present

## 2013-11-23 DIAGNOSIS — M9981 Other biomechanical lesions of cervical region: Secondary | ICD-10-CM | POA: Diagnosis not present

## 2013-11-23 DIAGNOSIS — M546 Pain in thoracic spine: Secondary | ICD-10-CM | POA: Diagnosis not present

## 2014-02-24 ENCOUNTER — Encounter (HOSPITAL_COMMUNITY): Payer: Self-pay | Admitting: Cardiology

## 2014-10-05 ENCOUNTER — Encounter: Payer: Self-pay | Admitting: *Deleted

## 2015-12-26 IMAGING — CR DG CHEST 1V PORT
1 series · 1 of 1 positions shown · non-contrast
Comparison: 03/23/2012

CLINICAL DATA: Chest pain

EXAM:
PORTABLE CHEST - 1 VIEW

[AP]
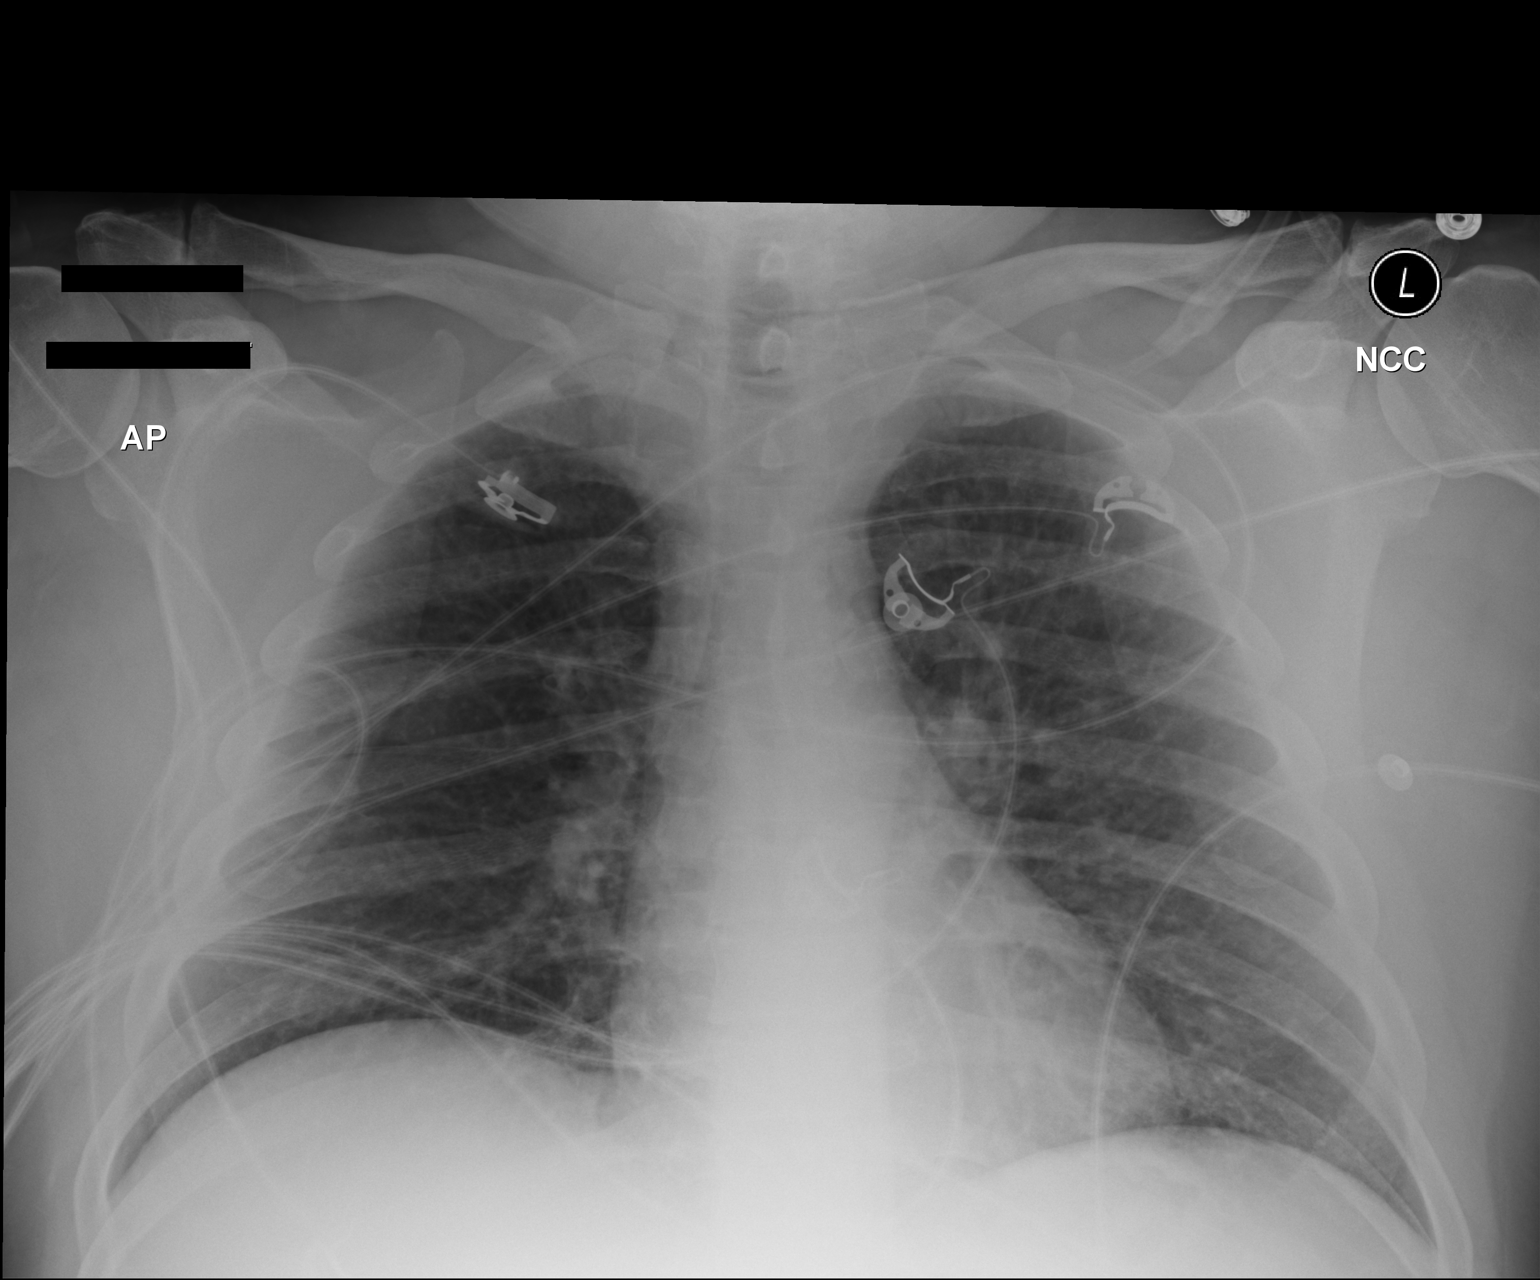

[1 of 1 positions shown; findings below may reference images not displayed]

FINDINGS: The heart size and mediastinal contours are within normal limits.
Both lungs are clear. The visualized skeletal structures are
unremarkable.
IMPRESSION: No active disease.

## 2016-04-30 DIAGNOSIS — E1151 Type 2 diabetes mellitus with diabetic peripheral angiopathy without gangrene: Secondary | ICD-10-CM | POA: Diagnosis not present

## 2016-04-30 DIAGNOSIS — Z794 Long term (current) use of insulin: Secondary | ICD-10-CM | POA: Diagnosis not present

## 2016-04-30 DIAGNOSIS — G4733 Obstructive sleep apnea (adult) (pediatric): Secondary | ICD-10-CM | POA: Diagnosis not present

## 2016-04-30 DIAGNOSIS — E1142 Type 2 diabetes mellitus with diabetic polyneuropathy: Secondary | ICD-10-CM | POA: Diagnosis not present

## 2016-04-30 DIAGNOSIS — G8929 Other chronic pain: Secondary | ICD-10-CM | POA: Diagnosis not present

## 2016-04-30 DIAGNOSIS — I2510 Atherosclerotic heart disease of native coronary artery without angina pectoris: Secondary | ICD-10-CM | POA: Diagnosis not present

## 2016-04-30 DIAGNOSIS — Z89512 Acquired absence of left leg below knee: Secondary | ICD-10-CM | POA: Diagnosis not present

## 2016-05-02 DIAGNOSIS — G4733 Obstructive sleep apnea (adult) (pediatric): Secondary | ICD-10-CM | POA: Diagnosis not present

## 2016-05-02 DIAGNOSIS — Z794 Long term (current) use of insulin: Secondary | ICD-10-CM | POA: Diagnosis not present

## 2016-05-02 DIAGNOSIS — G8929 Other chronic pain: Secondary | ICD-10-CM | POA: Diagnosis not present

## 2016-05-02 DIAGNOSIS — I2510 Atherosclerotic heart disease of native coronary artery without angina pectoris: Secondary | ICD-10-CM | POA: Diagnosis not present

## 2016-05-02 DIAGNOSIS — E1142 Type 2 diabetes mellitus with diabetic polyneuropathy: Secondary | ICD-10-CM | POA: Diagnosis not present

## 2016-05-02 DIAGNOSIS — E1151 Type 2 diabetes mellitus with diabetic peripheral angiopathy without gangrene: Secondary | ICD-10-CM | POA: Diagnosis not present

## 2016-05-03 DIAGNOSIS — Z794 Long term (current) use of insulin: Secondary | ICD-10-CM | POA: Diagnosis not present

## 2016-05-03 DIAGNOSIS — E1142 Type 2 diabetes mellitus with diabetic polyneuropathy: Secondary | ICD-10-CM | POA: Diagnosis not present

## 2016-05-03 DIAGNOSIS — G4733 Obstructive sleep apnea (adult) (pediatric): Secondary | ICD-10-CM | POA: Diagnosis not present

## 2016-05-03 DIAGNOSIS — I2510 Atherosclerotic heart disease of native coronary artery without angina pectoris: Secondary | ICD-10-CM | POA: Diagnosis not present

## 2016-05-03 DIAGNOSIS — G8929 Other chronic pain: Secondary | ICD-10-CM | POA: Diagnosis not present

## 2016-05-03 DIAGNOSIS — E1151 Type 2 diabetes mellitus with diabetic peripheral angiopathy without gangrene: Secondary | ICD-10-CM | POA: Diagnosis not present

## 2016-05-16 DIAGNOSIS — I2510 Atherosclerotic heart disease of native coronary artery without angina pectoris: Secondary | ICD-10-CM | POA: Diagnosis not present

## 2016-05-16 DIAGNOSIS — E1151 Type 2 diabetes mellitus with diabetic peripheral angiopathy without gangrene: Secondary | ICD-10-CM | POA: Diagnosis not present

## 2016-05-16 DIAGNOSIS — G4733 Obstructive sleep apnea (adult) (pediatric): Secondary | ICD-10-CM | POA: Diagnosis not present

## 2016-05-16 DIAGNOSIS — G8929 Other chronic pain: Secondary | ICD-10-CM | POA: Diagnosis not present

## 2016-05-16 DIAGNOSIS — E1142 Type 2 diabetes mellitus with diabetic polyneuropathy: Secondary | ICD-10-CM | POA: Diagnosis not present

## 2016-05-16 DIAGNOSIS — Z794 Long term (current) use of insulin: Secondary | ICD-10-CM | POA: Diagnosis not present

## 2016-05-23 DIAGNOSIS — Z794 Long term (current) use of insulin: Secondary | ICD-10-CM | POA: Diagnosis not present

## 2016-05-23 DIAGNOSIS — E1151 Type 2 diabetes mellitus with diabetic peripheral angiopathy without gangrene: Secondary | ICD-10-CM | POA: Diagnosis not present

## 2016-05-23 DIAGNOSIS — I2510 Atherosclerotic heart disease of native coronary artery without angina pectoris: Secondary | ICD-10-CM | POA: Diagnosis not present

## 2016-05-23 DIAGNOSIS — G8929 Other chronic pain: Secondary | ICD-10-CM | POA: Diagnosis not present

## 2016-05-23 DIAGNOSIS — G4733 Obstructive sleep apnea (adult) (pediatric): Secondary | ICD-10-CM | POA: Diagnosis not present

## 2016-05-23 DIAGNOSIS — E1142 Type 2 diabetes mellitus with diabetic polyneuropathy: Secondary | ICD-10-CM | POA: Diagnosis not present

## 2016-05-30 DIAGNOSIS — Z794 Long term (current) use of insulin: Secondary | ICD-10-CM | POA: Diagnosis not present

## 2016-05-30 DIAGNOSIS — G8929 Other chronic pain: Secondary | ICD-10-CM | POA: Diagnosis not present

## 2016-05-30 DIAGNOSIS — E1142 Type 2 diabetes mellitus with diabetic polyneuropathy: Secondary | ICD-10-CM | POA: Diagnosis not present

## 2016-05-30 DIAGNOSIS — E1151 Type 2 diabetes mellitus with diabetic peripheral angiopathy without gangrene: Secondary | ICD-10-CM | POA: Diagnosis not present

## 2016-05-30 DIAGNOSIS — G4733 Obstructive sleep apnea (adult) (pediatric): Secondary | ICD-10-CM | POA: Diagnosis not present

## 2016-05-30 DIAGNOSIS — I2510 Atherosclerotic heart disease of native coronary artery without angina pectoris: Secondary | ICD-10-CM | POA: Diagnosis not present

## 2016-06-06 DIAGNOSIS — G4733 Obstructive sleep apnea (adult) (pediatric): Secondary | ICD-10-CM | POA: Diagnosis not present

## 2016-06-06 DIAGNOSIS — E1151 Type 2 diabetes mellitus with diabetic peripheral angiopathy without gangrene: Secondary | ICD-10-CM | POA: Diagnosis not present

## 2016-06-06 DIAGNOSIS — Z794 Long term (current) use of insulin: Secondary | ICD-10-CM | POA: Diagnosis not present

## 2016-06-06 DIAGNOSIS — I2510 Atherosclerotic heart disease of native coronary artery without angina pectoris: Secondary | ICD-10-CM | POA: Diagnosis not present

## 2016-06-06 DIAGNOSIS — E1142 Type 2 diabetes mellitus with diabetic polyneuropathy: Secondary | ICD-10-CM | POA: Diagnosis not present

## 2016-06-06 DIAGNOSIS — G8929 Other chronic pain: Secondary | ICD-10-CM | POA: Diagnosis not present

## 2016-06-13 DIAGNOSIS — Z794 Long term (current) use of insulin: Secondary | ICD-10-CM | POA: Diagnosis not present

## 2016-06-13 DIAGNOSIS — E1151 Type 2 diabetes mellitus with diabetic peripheral angiopathy without gangrene: Secondary | ICD-10-CM | POA: Diagnosis not present

## 2016-06-13 DIAGNOSIS — G8929 Other chronic pain: Secondary | ICD-10-CM | POA: Diagnosis not present

## 2016-06-13 DIAGNOSIS — E1142 Type 2 diabetes mellitus with diabetic polyneuropathy: Secondary | ICD-10-CM | POA: Diagnosis not present

## 2016-06-13 DIAGNOSIS — G4733 Obstructive sleep apnea (adult) (pediatric): Secondary | ICD-10-CM | POA: Diagnosis not present

## 2016-06-13 DIAGNOSIS — I2510 Atherosclerotic heart disease of native coronary artery without angina pectoris: Secondary | ICD-10-CM | POA: Diagnosis not present

## 2016-06-20 DIAGNOSIS — E1151 Type 2 diabetes mellitus with diabetic peripheral angiopathy without gangrene: Secondary | ICD-10-CM | POA: Diagnosis not present

## 2016-06-20 DIAGNOSIS — I2510 Atherosclerotic heart disease of native coronary artery without angina pectoris: Secondary | ICD-10-CM | POA: Diagnosis not present

## 2016-06-20 DIAGNOSIS — Z794 Long term (current) use of insulin: Secondary | ICD-10-CM | POA: Diagnosis not present

## 2016-06-20 DIAGNOSIS — G4733 Obstructive sleep apnea (adult) (pediatric): Secondary | ICD-10-CM | POA: Diagnosis not present

## 2016-06-20 DIAGNOSIS — G8929 Other chronic pain: Secondary | ICD-10-CM | POA: Diagnosis not present

## 2016-06-20 DIAGNOSIS — E1142 Type 2 diabetes mellitus with diabetic polyneuropathy: Secondary | ICD-10-CM | POA: Diagnosis not present

## 2016-06-27 DIAGNOSIS — G4733 Obstructive sleep apnea (adult) (pediatric): Secondary | ICD-10-CM | POA: Diagnosis not present

## 2016-06-27 DIAGNOSIS — E1142 Type 2 diabetes mellitus with diabetic polyneuropathy: Secondary | ICD-10-CM | POA: Diagnosis not present

## 2016-06-27 DIAGNOSIS — E1151 Type 2 diabetes mellitus with diabetic peripheral angiopathy without gangrene: Secondary | ICD-10-CM | POA: Diagnosis not present

## 2016-06-27 DIAGNOSIS — I2510 Atherosclerotic heart disease of native coronary artery without angina pectoris: Secondary | ICD-10-CM | POA: Diagnosis not present

## 2016-06-27 DIAGNOSIS — G8929 Other chronic pain: Secondary | ICD-10-CM | POA: Diagnosis not present

## 2016-06-27 DIAGNOSIS — Z794 Long term (current) use of insulin: Secondary | ICD-10-CM | POA: Diagnosis not present

## 2017-03-12 DIAGNOSIS — L0100 Impetigo, unspecified: Secondary | ICD-10-CM | POA: Diagnosis not present

## 2017-08-29 DIAGNOSIS — K7581 Nonalcoholic steatohepatitis (NASH): Secondary | ICD-10-CM | POA: Diagnosis present

## 2022-07-19 ENCOUNTER — Inpatient Hospital Stay (HOSPITAL_COMMUNITY)
Admission: EM | Disposition: A | Discharge status: Home / Self Care | Payer: Self-pay | Source: Home / Self Care | Attending: Interventional Cardiology

## 2022-07-19 ENCOUNTER — Inpatient Hospital Stay (HOSPITAL_COMMUNITY)
Admission: EM | Admit: 2022-07-19 | Discharge: 2022-07-23 | DRG: 321 | Disposition: A | Discharge status: Home / Self Care | Payer: No Typology Code available for payment source | Attending: Interventional Cardiology | Admitting: Interventional Cardiology

## 2022-07-19 ENCOUNTER — Inpatient Hospital Stay (HOSPITAL_COMMUNITY): Payer: No Typology Code available for payment source

## 2022-07-19 DIAGNOSIS — I2111 ST elevation (STEMI) myocardial infarction involving right coronary artery: Secondary | ICD-10-CM | POA: Diagnosis not present

## 2022-07-19 DIAGNOSIS — E1165 Type 2 diabetes mellitus with hyperglycemia: Secondary | ICD-10-CM | POA: Diagnosis present

## 2022-07-19 DIAGNOSIS — J449 Chronic obstructive pulmonary disease, unspecified: Secondary | ICD-10-CM | POA: Diagnosis present

## 2022-07-19 DIAGNOSIS — I214 Non-ST elevation (NSTEMI) myocardial infarction: Principal | ICD-10-CM

## 2022-07-19 DIAGNOSIS — Z7951 Long term (current) use of inhaled steroids: Secondary | ICD-10-CM

## 2022-07-19 DIAGNOSIS — E1122 Type 2 diabetes mellitus with diabetic chronic kidney disease: Secondary | ICD-10-CM | POA: Diagnosis present

## 2022-07-19 DIAGNOSIS — Z833 Family history of diabetes mellitus: Secondary | ICD-10-CM

## 2022-07-19 DIAGNOSIS — Z85118 Personal history of other malignant neoplasm of bronchus and lung: Secondary | ICD-10-CM

## 2022-07-19 DIAGNOSIS — I10 Essential (primary) hypertension: Secondary | ICD-10-CM | POA: Diagnosis not present

## 2022-07-19 DIAGNOSIS — E1159 Type 2 diabetes mellitus with other circulatory complications: Secondary | ICD-10-CM

## 2022-07-19 DIAGNOSIS — Z91041 Radiographic dye allergy status: Secondary | ICD-10-CM

## 2022-07-19 DIAGNOSIS — F17210 Nicotine dependence, cigarettes, uncomplicated: Secondary | ICD-10-CM | POA: Diagnosis present

## 2022-07-19 DIAGNOSIS — G8929 Other chronic pain: Secondary | ICD-10-CM | POA: Diagnosis present

## 2022-07-19 DIAGNOSIS — Z8249 Family history of ischemic heart disease and other diseases of the circulatory system: Secondary | ICD-10-CM | POA: Diagnosis not present

## 2022-07-19 DIAGNOSIS — I5021 Acute systolic (congestive) heart failure: Secondary | ICD-10-CM | POA: Diagnosis present

## 2022-07-19 DIAGNOSIS — E781 Pure hyperglyceridemia: Secondary | ICD-10-CM | POA: Diagnosis present

## 2022-07-19 DIAGNOSIS — I739 Peripheral vascular disease, unspecified: Secondary | ICD-10-CM

## 2022-07-19 DIAGNOSIS — Y831 Surgical operation with implant of artificial internal device as the cause of abnormal reaction of the patient, or of later complication, without mention of misadventure at the time of the procedure: Secondary | ICD-10-CM | POA: Diagnosis present

## 2022-07-19 DIAGNOSIS — I2510 Atherosclerotic heart disease of native coronary artery without angina pectoris: Secondary | ICD-10-CM | POA: Diagnosis present

## 2022-07-19 DIAGNOSIS — Z993 Dependence on wheelchair: Secondary | ICD-10-CM | POA: Diagnosis not present

## 2022-07-19 DIAGNOSIS — E871 Hypo-osmolality and hyponatremia: Secondary | ICD-10-CM | POA: Diagnosis not present

## 2022-07-19 DIAGNOSIS — Z823 Family history of stroke: Secondary | ICD-10-CM | POA: Diagnosis not present

## 2022-07-19 DIAGNOSIS — E8809 Other disorders of plasma-protein metabolism, not elsewhere classified: Secondary | ICD-10-CM | POA: Diagnosis present

## 2022-07-19 DIAGNOSIS — Z79899 Other long term (current) drug therapy: Secondary | ICD-10-CM

## 2022-07-19 DIAGNOSIS — N1831 Chronic kidney disease, stage 3a: Secondary | ICD-10-CM | POA: Diagnosis present

## 2022-07-19 DIAGNOSIS — I252 Old myocardial infarction: Secondary | ICD-10-CM | POA: Diagnosis not present

## 2022-07-19 DIAGNOSIS — Z794 Long term (current) use of insulin: Secondary | ICD-10-CM | POA: Diagnosis not present

## 2022-07-19 DIAGNOSIS — I130 Hypertensive heart and chronic kidney disease with heart failure and stage 1 through stage 4 chronic kidney disease, or unspecified chronic kidney disease: Secondary | ICD-10-CM | POA: Diagnosis present

## 2022-07-19 DIAGNOSIS — I2489 Other forms of acute ischemic heart disease: Secondary | ICD-10-CM | POA: Diagnosis not present

## 2022-07-19 DIAGNOSIS — Z89512 Acquired absence of left leg below knee: Secondary | ICD-10-CM | POA: Diagnosis not present

## 2022-07-19 DIAGNOSIS — Z7902 Long term (current) use of antithrombotics/antiplatelets: Secondary | ICD-10-CM

## 2022-07-19 DIAGNOSIS — E7800 Pure hypercholesterolemia, unspecified: Secondary | ICD-10-CM | POA: Diagnosis present

## 2022-07-19 DIAGNOSIS — I2119 ST elevation (STEMI) myocardial infarction involving other coronary artery of inferior wall: Secondary | ICD-10-CM | POA: Diagnosis present

## 2022-07-19 DIAGNOSIS — Z720 Tobacco use: Secondary | ICD-10-CM | POA: Diagnosis not present

## 2022-07-19 DIAGNOSIS — Z7982 Long term (current) use of aspirin: Secondary | ICD-10-CM

## 2022-07-19 DIAGNOSIS — T82855A Stenosis of coronary artery stent, initial encounter: Secondary | ICD-10-CM | POA: Diagnosis present

## 2022-07-19 DIAGNOSIS — Z955 Presence of coronary angioplasty implant and graft: Secondary | ICD-10-CM

## 2022-07-19 DIAGNOSIS — G894 Chronic pain syndrome: Secondary | ICD-10-CM | POA: Diagnosis not present

## 2022-07-19 DIAGNOSIS — E119 Type 2 diabetes mellitus without complications: Secondary | ICD-10-CM

## 2022-07-19 DIAGNOSIS — E782 Mixed hyperlipidemia: Secondary | ICD-10-CM | POA: Diagnosis not present

## 2022-07-19 HISTORY — PX: CORONARY THROMBECTOMY: CATH118304

## 2022-07-19 HISTORY — PX: LEFT HEART CATH AND CORONARY ANGIOGRAPHY: CATH118249

## 2022-07-19 HISTORY — PX: CORONARY STENT INTERVENTION: CATH118234

## 2022-07-19 LAB — CBC WITH DIFFERENTIAL/PLATELET
Abs Immature Granulocytes: 0.09 10*3/uL — ABNORMAL HIGH (ref 0.00–0.07)
Basophils Absolute: 0.1 10*3/uL (ref 0.0–0.1)
Basophils Relative: 1 %
Eosinophils Absolute: 0.2 10*3/uL (ref 0.0–0.5)
Eosinophils Relative: 1 %
HCT: 39.8 % (ref 39.0–52.0)
Hemoglobin: 13.7 g/dL (ref 13.0–17.0)
Immature Granulocytes: 1 %
Lymphocytes Relative: 10 %
Lymphs Abs: 1.7 10*3/uL (ref 0.7–4.0)
MCH: 31.9 pg (ref 26.0–34.0)
MCHC: 34.4 g/dL (ref 30.0–36.0)
MCV: 92.8 fL (ref 80.0–100.0)
Monocytes Absolute: 1 10*3/uL (ref 0.1–1.0)
Monocytes Relative: 6 %
Neutro Abs: 14 10*3/uL — ABNORMAL HIGH (ref 1.7–7.7)
Neutrophils Relative %: 81 %
Platelets: 430 10*3/uL — ABNORMAL HIGH (ref 150–400)
RBC: 4.29 MIL/uL (ref 4.22–5.81)
RDW: 16.1 % — ABNORMAL HIGH (ref 11.5–15.5)
WBC: 17.1 10*3/uL — ABNORMAL HIGH (ref 4.0–10.5)
nRBC: 0 % (ref 0.0–0.2)

## 2022-07-19 LAB — COMPREHENSIVE METABOLIC PANEL
ALT: 24 U/L (ref 0–44)
AST: 22 U/L (ref 15–41)
Albumin: 2.6 g/dL — ABNORMAL LOW (ref 3.5–5.0)
Alkaline Phosphatase: 176 U/L — ABNORMAL HIGH (ref 38–126)
Anion gap: 12 (ref 5–15)
BUN: 26 mg/dL — ABNORMAL HIGH (ref 8–23)
CO2: 19 mmol/L — ABNORMAL LOW (ref 22–32)
Calcium: 8.5 mg/dL — ABNORMAL LOW (ref 8.9–10.3)
Chloride: 95 mmol/L — ABNORMAL LOW (ref 98–111)
Creatinine, Ser: 1.45 mg/dL — ABNORMAL HIGH (ref 0.61–1.24)
GFR, Estimated: 53 mL/min — ABNORMAL LOW (ref >60)
Glucose, Bld: 454 mg/dL — ABNORMAL HIGH (ref 70–99)
Potassium: 4.7 mmol/L (ref 3.5–5.1)
Sodium: 126 mmol/L — ABNORMAL LOW (ref 135–145)
Total Bilirubin: 0.5 mg/dL (ref 0.3–1.2)
Total Protein: 6 g/dL — ABNORMAL LOW (ref 6.5–8.1)

## 2022-07-19 LAB — ECHOCARDIOGRAM COMPLETE
AR max vel: 1.64 cm2
AV Area VTI: 1.69 cm2
AV Area mean vel: 1.54 cm2
AV Mean grad: 10 mmHg
AV Peak grad: 18.7 mmHg
Ao pk vel: 2.16 m/s
Calc EF: 49.2 %
Height: 68 in
P 1/2 time: 273 msec
S' Lateral: 3.9 cm
Single Plane A2C EF: 45 %
Single Plane A4C EF: 51.2 %
Weight: 3056 oz

## 2022-07-19 LAB — POCT I-STAT, CHEM 8
BUN: 26 mg/dL — ABNORMAL HIGH (ref 8–23)
Calcium, Ion: 1.19 mmol/L (ref 1.15–1.40)
Chloride: 89 mmol/L — ABNORMAL LOW (ref 98–111)
Creatinine, Ser: 1.1 mg/dL (ref 0.61–1.24)
Glucose, Bld: 416 mg/dL — ABNORMAL HIGH (ref 70–99)
HCT: 41 % (ref 39.0–52.0)
Hemoglobin: 13.9 g/dL (ref 13.0–17.0)
Potassium: 4.8 mmol/L (ref 3.5–5.1)
Sodium: 118 mmol/L — CL (ref 135–145)
TCO2: 22 mmol/L (ref 22–32)

## 2022-07-19 LAB — GLUCOSE, CAPILLARY
Glucose-Capillary: 446 mg/dL — ABNORMAL HIGH (ref 70–99)
Glucose-Capillary: 403 mg/dL — ABNORMAL HIGH (ref 70–99)
Glucose-Capillary: 474 mg/dL — ABNORMAL HIGH (ref 70–99)

## 2022-07-19 LAB — HEMOGLOBIN A1C
Hgb A1c MFr Bld: 11.4 % — ABNORMAL HIGH (ref 4.8–5.6)
Mean Plasma Glucose: 280.48 mg/dL

## 2022-07-19 LAB — LIPID PANEL
Cholesterol: 363 mg/dL — ABNORMAL HIGH (ref 0–200)
HDL: 33 mg/dL — ABNORMAL LOW (ref >40)
LDL Cholesterol: UNDETERMINED mg/dL (ref 0–99)
Total CHOL/HDL Ratio: 11 RATIO
Triglycerides: 1003 mg/dL — ABNORMAL HIGH (ref <150)
VLDL: UNDETERMINED mg/dL (ref 0–40)

## 2022-07-19 LAB — PROTIME-INR
INR: 1 (ref 0.8–1.2)
Prothrombin Time: 12.8 seconds (ref 11.4–15.2)

## 2022-07-19 LAB — POCT ACTIVATED CLOTTING TIME
Activated Clotting Time: 309 seconds
Activated Clotting Time: 331 seconds

## 2022-07-19 LAB — TROPONIN I (HIGH SENSITIVITY)
Troponin I (High Sensitivity): 28 ng/L — ABNORMAL HIGH (ref <18)
Troponin I (High Sensitivity): 18090 ng/L (ref <18)

## 2022-07-19 LAB — APTT: aPTT: 30 seconds (ref 24–36)

## 2022-07-19 LAB — MRSA NEXT GEN BY PCR, NASAL: MRSA by PCR Next Gen: NOT DETECTED

## 2022-07-19 LAB — LDL CHOLESTEROL, DIRECT: Direct LDL: 115 mg/dL — ABNORMAL HIGH (ref 0–99)

## 2022-07-19 LAB — GLUCOSE, RANDOM: Glucose, Bld: 428 mg/dL — ABNORMAL HIGH (ref 70–99)

## 2022-07-19 SURGERY — LEFT HEART CATH AND CORONARY ANGIOGRAPHY
Anesthesia: LOCAL

## 2022-07-19 MED ORDER — HEPARIN SODIUM (PORCINE) 1000 UNIT/ML IJ SOLN
INTRAMUSCULAR | Status: AC
  Filled 2022-07-19: qty 10

## 2022-07-19 MED ORDER — CYCLOBENZAPRINE HCL 10 MG PO TABS
10.0000 mg | ORAL_TABLET | Freq: Every day | ORAL | Status: DC
  Administered 2022-07-19 – 2022-07-22 (×4): 10 mg via ORAL
  Filled 2022-07-19 (×4): qty 1

## 2022-07-19 MED ORDER — INSULIN ASPART 100 UNIT/ML IJ SOLN
0.0000 [IU] | Freq: Three times a day (TID) | INTRAMUSCULAR | Status: DC
  Administered 2022-07-20: 15 [IU] via SUBCUTANEOUS
  Administered 2022-07-20: 11 [IU] via SUBCUTANEOUS
  Administered 2022-07-20: 15 [IU] via SUBCUTANEOUS
  Administered 2022-07-20 – 2022-07-21 (×2): 11 [IU] via SUBCUTANEOUS
  Administered 2022-07-21: 5 [IU] via SUBCUTANEOUS
  Administered 2022-07-21: 3 [IU] via SUBCUTANEOUS
  Administered 2022-07-21: 8 [IU] via SUBCUTANEOUS
  Administered 2022-07-22: 11 [IU] via SUBCUTANEOUS
  Administered 2022-07-22: 2 [IU] via SUBCUTANEOUS
  Administered 2022-07-22 (×2): 3 [IU] via SUBCUTANEOUS
  Administered 2022-07-23 (×2): 8 [IU] via SUBCUTANEOUS

## 2022-07-19 MED ORDER — SODIUM CHLORIDE 0.9 % IV SOLN: INTRAVENOUS | Status: AC

## 2022-07-19 MED ORDER — MAGNESIUM OXIDE -MG SUPPLEMENT 400 (240 MG) MG PO TABS
400.0000 mg | ORAL_TABLET | Freq: Two times a day (BID) | ORAL | Status: DC
  Administered 2022-07-19 – 2022-07-22 (×7): 400 mg via ORAL
  Filled 2022-07-19 (×8): qty 1

## 2022-07-19 MED ORDER — NITROGLYCERIN 0.4 MG SL SUBL
0.4000 mg | SUBLINGUAL_TABLET | SUBLINGUAL | Status: DC | PRN
  Administered 2022-07-22 – 2022-07-23 (×4): 0.4 mg via SUBLINGUAL
  Filled 2022-07-19 (×3): qty 1

## 2022-07-19 MED ORDER — HEPARIN SODIUM (PORCINE) 1000 UNIT/ML IJ SOLN
INTRAMUSCULAR | Status: DC | PRN
  Administered 2022-07-19: 2000 [IU] via INTRAVENOUS
  Administered 2022-07-19: 9000 [IU] via INTRAVENOUS

## 2022-07-19 MED ORDER — MOMETASONE FURO-FORMOTEROL FUM 200-5 MCG/ACT IN AERO
2.0000 | INHALATION_SPRAY | Freq: Two times a day (BID) | RESPIRATORY_TRACT | Status: DC
  Administered 2022-07-19 – 2022-07-23 (×8): 2 via RESPIRATORY_TRACT
  Filled 2022-07-19: qty 8.8

## 2022-07-19 MED ORDER — PANTOPRAZOLE SODIUM 40 MG PO TBEC
40.0000 mg | DELAYED_RELEASE_TABLET | Freq: Every day | ORAL | Status: DC
  Administered 2022-07-19 – 2022-07-23 (×5): 40 mg via ORAL
  Filled 2022-07-19 (×6): qty 1

## 2022-07-19 MED ORDER — SENNA 8.6 MG PO TABS
1.0000 | ORAL_TABLET | Freq: Every day | ORAL | Status: DC
  Administered 2022-07-19 – 2022-07-23 (×5): 8.6 mg via ORAL
  Filled 2022-07-19 (×5): qty 1

## 2022-07-19 MED ORDER — TIROFIBAN (AGGRASTAT) BOLUS VIA INFUSION
INTRAVENOUS | Status: DC | PRN
  Administered 2022-07-19: 2165 ug via INTRAVENOUS

## 2022-07-19 MED ORDER — MIDAZOLAM HCL 2 MG/2ML IJ SOLN
INTRAMUSCULAR | Status: AC
  Filled 2022-07-19: qty 2

## 2022-07-19 MED ORDER — SODIUM CHLORIDE 0.9% FLUSH: 3.0000 mL | INTRAVENOUS | Status: DC | PRN

## 2022-07-19 MED ORDER — ASPIRIN 81 MG PO CHEW
81.0000 mg | CHEWABLE_TABLET | Freq: Every day | ORAL | Status: DC
  Administered 2022-07-20 – 2022-07-23 (×4): 81 mg via ORAL
  Filled 2022-07-19 (×4): qty 1

## 2022-07-19 MED ORDER — METHYLPREDNISOLONE SODIUM SUCC 125 MG IJ SOLR
INTRAMUSCULAR | Status: DC | PRN
  Administered 2022-07-19: 125 mg via INTRAVENOUS

## 2022-07-19 MED ORDER — ASPIRIN 81 MG PO TBEC: 81.0000 mg | DELAYED_RELEASE_TABLET | Freq: Every day | ORAL | Status: DC

## 2022-07-19 MED ORDER — HYDROCODONE-ACETAMINOPHEN 5-325 MG PO TABS
1.0000 | ORAL_TABLET | Freq: Three times a day (TID) | ORAL | Status: DC | PRN
  Administered 2022-07-20 – 2022-07-23 (×4): 1 via ORAL
  Filled 2022-07-19 (×4): qty 1

## 2022-07-19 MED ORDER — IOHEXOL 350 MG/ML SOLN
INTRAVENOUS | Status: DC | PRN
  Administered 2022-07-19: 150 mL

## 2022-07-19 MED ORDER — INSULIN ASPART 100 UNIT/ML IJ SOLN
INTRAMUSCULAR | Status: DC | PRN
  Administered 2022-07-19: 10 [IU] via INTRAVENOUS

## 2022-07-19 MED ORDER — SODIUM CHLORIDE 0.9 % IV SOLN: 250.0000 mL | INTRAVENOUS | Status: DC | PRN

## 2022-07-19 MED ORDER — TICAGRELOR 90 MG PO TABS
90.0000 mg | ORAL_TABLET | Freq: Two times a day (BID) | ORAL | Status: DC
  Administered 2022-07-19 – 2022-07-23 (×8): 90 mg via ORAL
  Filled 2022-07-19 (×8): qty 1

## 2022-07-19 MED ORDER — METHYLPREDNISOLONE SODIUM SUCC 125 MG IJ SOLR
INTRAMUSCULAR | Status: AC
  Filled 2022-07-19: qty 2

## 2022-07-19 MED ORDER — ALBUTEROL SULFATE HFA 108 (90 BASE) MCG/ACT IN AERS: 2.0000 | INHALATION_SPRAY | Freq: Four times a day (QID) | RESPIRATORY_TRACT | Status: DC | PRN

## 2022-07-19 MED ORDER — DIPHENHYDRAMINE HCL 50 MG/ML IJ SOLN: 50.0000 mg | Freq: Once | INTRAMUSCULAR | Status: DC

## 2022-07-19 MED ORDER — OMEGA-3-ACID ETHYL ESTERS 1 G PO CAPS
2.0000 g | ORAL_CAPSULE | Freq: Two times a day (BID) | ORAL | Status: DC
  Administered 2022-07-19 – 2022-07-23 (×8): 2 g via ORAL
  Filled 2022-07-19 (×9): qty 2

## 2022-07-19 MED ORDER — VERAPAMIL HCL 2.5 MG/ML IV SOLN
INTRAVENOUS | Status: DC | PRN
  Administered 2022-07-19 (×3): 200 ug via INTRACORONARY

## 2022-07-19 MED ORDER — METHYLPREDNISOLONE SODIUM SUCC 125 MG IJ SOLR: 40.0000 mg | Freq: Once | INTRAMUSCULAR | Status: DC

## 2022-07-19 MED ORDER — TIROFIBAN HCL IN NACL 5-0.9 MG/100ML-% IV SOLN
INTRAVENOUS | Status: AC
  Filled 2022-07-19: qty 100

## 2022-07-19 MED ORDER — HEPARIN (PORCINE) IN NACL 1000-0.9 UT/500ML-% IV SOLN
INTRAVENOUS | Status: DC | PRN
  Administered 2022-07-19 (×2): 500 mL

## 2022-07-19 MED ORDER — TIROFIBAN HCL IN NACL 5-0.9 MG/100ML-% IV SOLN
INTRAVENOUS | Status: DC | PRN
  Administered 2022-07-19: .15 ug/kg/min via INTRAVENOUS

## 2022-07-19 MED ORDER — ATORVASTATIN CALCIUM 80 MG PO TABS
80.0000 mg | ORAL_TABLET | Freq: Every day | ORAL | Status: DC
  Administered 2022-07-19 – 2022-07-23 (×5): 80 mg via ORAL
  Filled 2022-07-19 (×5): qty 1

## 2022-07-19 MED ORDER — TICAGRELOR 90 MG PO TABS
ORAL_TABLET | ORAL | Status: DC | PRN
  Administered 2022-07-19: 180 mg via ORAL

## 2022-07-19 MED ORDER — FENTANYL CITRATE (PF) 100 MCG/2ML IJ SOLN
INTRAMUSCULAR | Status: AC
  Filled 2022-07-19: qty 2

## 2022-07-19 MED ORDER — TRAZODONE HCL 50 MG PO TABS
50.0000 mg | ORAL_TABLET | Freq: Every day | ORAL | Status: DC
  Administered 2022-07-19 – 2022-07-22 (×4): 50 mg via ORAL
  Filled 2022-07-19 (×4): qty 1

## 2022-07-19 MED ORDER — DIPHENHYDRAMINE HCL 25 MG PO CAPS
50.0000 mg | ORAL_CAPSULE | Freq: Once | ORAL | Status: DC
  Filled 2022-07-19: qty 2

## 2022-07-19 MED ORDER — ATROPINE SULFATE 1 MG/10ML IJ SOSY
PREFILLED_SYRINGE | INTRAMUSCULAR | Status: DC | PRN
  Administered 2022-07-19: .5 mg via INTRAVENOUS

## 2022-07-19 MED ORDER — ONDANSETRON HCL 4 MG/2ML IJ SOLN: 4.0000 mg | Freq: Four times a day (QID) | INTRAMUSCULAR | Status: DC | PRN

## 2022-07-19 MED ORDER — GABAPENTIN 300 MG PO CAPS
300.0000 mg | ORAL_CAPSULE | Freq: Three times a day (TID) | ORAL | Status: DC
  Administered 2022-07-19 – 2022-07-23 (×12): 300 mg via ORAL
  Filled 2022-07-19 (×12): qty 1

## 2022-07-19 MED ORDER — INSULIN ASPART 100 UNIT/ML IJ SOLN
10.0000 [IU] | Freq: Once | INTRAMUSCULAR | Status: AC
  Administered 2022-07-19: 10 [IU] via SUBCUTANEOUS

## 2022-07-19 MED ORDER — VERAPAMIL HCL 2.5 MG/ML IV SOLN
INTRAVENOUS | Status: DC | PRN
  Administered 2022-07-19: 10 mL via INTRA_ARTERIAL

## 2022-07-19 MED ORDER — VERAPAMIL HCL 2.5 MG/ML IV SOLN
INTRAVENOUS | Status: AC
  Filled 2022-07-19: qty 2

## 2022-07-19 MED ORDER — ALBUTEROL SULFATE (2.5 MG/3ML) 0.083% IN NEBU: 2.5000 mg | INHALATION_SOLUTION | Freq: Four times a day (QID) | RESPIRATORY_TRACT | Status: DC | PRN

## 2022-07-19 MED ORDER — DIPHENHYDRAMINE HCL 50 MG/ML IJ SOLN
INTRAMUSCULAR | Status: DC | PRN
  Administered 2022-07-19: 50 mg via INTRAVENOUS

## 2022-07-19 MED ORDER — ACETAMINOPHEN 325 MG PO TABS: 650.0000 mg | ORAL_TABLET | ORAL | Status: DC | PRN

## 2022-07-19 MED ORDER — SODIUM CHLORIDE 0.9% FLUSH
3.0000 mL | Freq: Two times a day (BID) | INTRAVENOUS | Status: DC
  Administered 2022-07-19 – 2022-07-23 (×8): 3 mL via INTRAVENOUS

## 2022-07-19 MED ORDER — FENTANYL CITRATE (PF) 100 MCG/2ML IJ SOLN
INTRAMUSCULAR | Status: DC | PRN
  Administered 2022-07-19: 25 ug via INTRAVENOUS

## 2022-07-19 MED ORDER — MIDAZOLAM HCL 2 MG/2ML IJ SOLN
INTRAMUSCULAR | Status: DC | PRN
  Administered 2022-07-19: 1 mg via INTRAVENOUS

## 2022-07-19 MED ORDER — DIPHENHYDRAMINE HCL 50 MG/ML IJ SOLN
INTRAMUSCULAR | Status: AC
  Filled 2022-07-19: qty 1

## 2022-07-19 MED ORDER — HYDRALAZINE HCL 20 MG/ML IJ SOLN: 10.0000 mg | INTRAMUSCULAR | Status: AC | PRN

## 2022-07-19 MED ORDER — LIDOCAINE HCL (PF) 1 % IJ SOLN
INTRAMUSCULAR | Status: AC
  Filled 2022-07-19: qty 30

## 2022-07-19 MED ORDER — INSULIN ASPART 100 UNIT/ML IJ SOLN
INTRAMUSCULAR | Status: AC
  Filled 2022-07-19: qty 1

## 2022-07-19 MED ORDER — INSULIN GLARGINE-YFGN 100 UNIT/ML ~~LOC~~ SOLN
10.0000 [IU] | Freq: Every day | SUBCUTANEOUS | Status: DC
  Administered 2022-07-19: 10 [IU] via SUBCUTANEOUS
  Filled 2022-07-19 (×2): qty 0.1

## 2022-07-19 MED ORDER — LABETALOL HCL 5 MG/ML IV SOLN: 10.0000 mg | INTRAVENOUS | Status: AC | PRN

## 2022-07-19 MED ORDER — METOPROLOL TARTRATE 25 MG PO TABS
25.0000 mg | ORAL_TABLET | Freq: Two times a day (BID) | ORAL | Status: DC
  Administered 2022-07-19 – 2022-07-23 (×8): 25 mg via ORAL
  Filled 2022-07-19 (×8): qty 1

## 2022-07-19 MED ORDER — LISINOPRIL 10 MG PO TABS: 10.0000 mg | ORAL_TABLET | Freq: Every day | ORAL | Status: DC

## 2022-07-19 MED ORDER — INSULIN ASPART 100 UNIT/ML IJ SOLN
0.0000 [IU] | Freq: Three times a day (TID) | INTRAMUSCULAR | Status: DC
  Administered 2022-07-19: 15 [IU] via SUBCUTANEOUS

## 2022-07-19 SURGICAL SUPPLY — 27 items
BALL SAPPHIRE NC24 3.0X12 (BALLOONS) ×1 IMPLANT
BALL SAPPHIRE NC24 3.75X8 (BALLOONS) ×1 IMPLANT
BALLN EMERGE MR 2.5X12 (BALLOONS) ×1 IMPLANT
BALLN SAPPHIRE 2.5X12 (BALLOONS) ×1 IMPLANT
BALLOON EMERGE MR 2.5X12 (BALLOONS) IMPLANT
BALLOON SAPPHIRE 2.5X12 (BALLOONS) IMPLANT
BALLOON SAPPHIRE NC24 3.0X12 (BALLOONS) IMPLANT
BALLOON SAPPHIRE NC24 3.75X8 (BALLOONS) IMPLANT
CATH 5FR JL3.5 JR4 ANG PIG MP (CATHETERS) IMPLANT
CATH EXTRAC PRONTO 5.5F 138CM (CATHETERS) IMPLANT
CATH LAUNCHER 6FR JR4 (CATHETERS) IMPLANT
DEVICE RAD COMP TR BAND LRG (VASCULAR PRODUCTS) IMPLANT
GLIDESHEATH SLEND SS 6F .021 (SHEATH) IMPLANT
GUIDEWIRE INQWIRE 1.5J.035X260 (WIRE) IMPLANT
INQWIRE 1.5J .035X260CM (WIRE) ×1 IMPLANT
KIT ENCORE 26 ADVANTAGE (KITS) IMPLANT
KIT HEART LEFT (KITS) ×1 IMPLANT
KIT HEMO VALVE WATCHDOG (MISCELLANEOUS) IMPLANT
PACK CARDIAC CATHETERIZATION (CUSTOM PROCEDURE TRAY) ×1 IMPLANT
SHEATH PROBE COVER 6X72 (BAG) IMPLANT
STENT SYNERGY XD 3.50X12 (Permanent Stent) IMPLANT
SYNERGY XD 3.50X12 (Permanent Stent) ×1 IMPLANT
TRANSDUCER W/STOPCOCK (MISCELLANEOUS) ×1 IMPLANT
TUBING CIL FLEX 10 FLL-RA (TUBING) ×1 IMPLANT
WIRE ASAHI PROWATER 180CM (WIRE) IMPLANT
WIRE RUNTHROUGH .014X180CM (WIRE) IMPLANT
WIRE RUNTHROUGH .014X300CM (WIRE) IMPLANT

## 2022-07-19 NOTE — Progress Notes (Signed)
  Echocardiogram 2D Echocardiogram has been performed.  Martin Thomas 07/19/2022, 5:51 PM
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
## 2022-07-20 ENCOUNTER — Encounter (HOSPITAL_COMMUNITY): Payer: Self-pay | Admitting: Interventional Cardiology

## 2022-07-20 ENCOUNTER — Other Ambulatory Visit: Payer: Self-pay

## 2022-07-20 DIAGNOSIS — J449 Chronic obstructive pulmonary disease, unspecified: Secondary | ICD-10-CM | POA: Diagnosis present

## 2022-07-20 DIAGNOSIS — G894 Chronic pain syndrome: Secondary | ICD-10-CM | POA: Diagnosis present

## 2022-07-20 DIAGNOSIS — I2119 ST elevation (STEMI) myocardial infarction involving other coronary artery of inferior wall: Principal | ICD-10-CM

## 2022-07-20 LAB — GLUCOSE, CAPILLARY
Glucose-Capillary: 385 mg/dL — ABNORMAL HIGH (ref 70–99)
Glucose-Capillary: 348 mg/dL — ABNORMAL HIGH (ref 70–99)
Glucose-Capillary: 400 mg/dL — ABNORMAL HIGH (ref 70–99)
Glucose-Capillary: 328 mg/dL — ABNORMAL HIGH (ref 70–99)
Glucose-Capillary: 382 mg/dL — ABNORMAL HIGH (ref 70–99)

## 2022-07-20 LAB — BASIC METABOLIC PANEL
Anion gap: 11 (ref 5–15)
BUN: 32 mg/dL — ABNORMAL HIGH (ref 8–23)
CO2: 21 mmol/L — ABNORMAL LOW (ref 22–32)
Calcium: 9.1 mg/dL (ref 8.9–10.3)
Chloride: 96 mmol/L — ABNORMAL LOW (ref 98–111)
Creatinine, Ser: 1.62 mg/dL — ABNORMAL HIGH (ref 0.61–1.24)
GFR, Estimated: 47 mL/min — ABNORMAL LOW (ref >60)
Glucose, Bld: 382 mg/dL — ABNORMAL HIGH (ref 70–99)
Potassium: 5.4 mmol/L — ABNORMAL HIGH (ref 3.5–5.1)
Sodium: 128 mmol/L — ABNORMAL LOW (ref 135–145)

## 2022-07-20 LAB — HEMOGLOBIN A1C
Hgb A1c MFr Bld: 11.5 % — ABNORMAL HIGH (ref 4.8–5.6)
Mean Plasma Glucose: 283.35 mg/dL

## 2022-07-20 LAB — CBC
HCT: 39.9 % (ref 39.0–52.0)
Hemoglobin: 13.7 g/dL (ref 13.0–17.0)
MCH: 31.6 pg (ref 26.0–34.0)
MCHC: 34.3 g/dL (ref 30.0–36.0)
MCV: 91.9 fL (ref 80.0–100.0)
Platelets: 364 10*3/uL (ref 150–400)
RBC: 4.34 MIL/uL (ref 4.22–5.81)
RDW: 16.2 % — ABNORMAL HIGH (ref 11.5–15.5)
WBC: 15.9 10*3/uL — ABNORMAL HIGH (ref 4.0–10.5)
nRBC: 0 % (ref 0.0–0.2)

## 2022-07-20 MED ORDER — INSULIN ASPART 100 UNIT/ML IJ SOLN
8.0000 [IU] | Freq: Three times a day (TID) | INTRAMUSCULAR | Status: DC
  Administered 2022-07-20 – 2022-07-22 (×6): 8 [IU] via SUBCUTANEOUS

## 2022-07-20 MED ORDER — INSULIN GLARGINE-YFGN 100 UNIT/ML ~~LOC~~ SOLN
25.0000 [IU] | Freq: Every day | SUBCUTANEOUS | Status: DC
  Administered 2022-07-20: 25 [IU] via SUBCUTANEOUS
  Filled 2022-07-20 (×3): qty 0.25

## 2022-07-20 NOTE — Significant Event (Signed)
Received notification regarding atrial fibrillation on monitor. On closer review, the patient had grouped beating ~80-90s, more likely related to [facility] phenomenon. He remains in sinus rhythm. No further intervention at this time.  Deitra Mayo, MD MPH [facility]

## 2022-07-20 NOTE — Assessment & Plan Note (Signed)
-  I have reviewed this patient in the [place] Controlled Substances Reporting System.  He is receiving medications from only one provider and appears to be taking them as prescribed. -He is not at particularly high risk of opioid misuse, diversion, or overdose.  -Continue cyclobenzaprine, gabapentin, Vicodin

## 2022-07-20 NOTE — Consult Note (Signed)
Initial Consultation Note   Patient: Martin Thomas ZOX:[id number] DOB: 1955/06/19 PCP: Default, Provider, MD DOA: 07/19/2022 DOS: the patient was seen and examined on 07/20/2022 Primary service: Corky Crafts, MD  Referring physician: Meng/Lambert Reason for consult: Need DM management.  Presented with STEMI.  Uncontrolled DM, A1c >11, glucose in 400 range despite insulin.   Assessment and Plan: * Acute inferior myocardial infarction (HCC) -Patent presented with STEMI -On ASA and Plavix PTA -Underwent RCA stent and multiple balloon angioplasties and now stable in ICU -Management per cardiology  Chronic pain disorder -I have reviewed this patient in the [place] Controlled Substances Reporting System.  He is receiving medications from only one provider and appears to be taking them as prescribed. -He is not at particularly high risk of opioid misuse, diversion, or overdose.  -Continue cyclobenzaprine, gabapentin, Vicodin  COPD (chronic obstructive pulmonary disease) (HCC) -Continue Symbicort (Dulera per formulary) and Albuterol  High cholesterol -Continue Tricor, Lovaza, Lipitor  HTN (hypertension) -Continue lisinopril, metoprolol  DM2 (diabetes mellitus, type 2) (HCC) -Poor baseline control, A1c is 11.4 -Resume home glargine, 25 units qhs (currently on 10 units) -Add 8 units qAC (also takes at home) -Continue moderate-scale SSI      TRH will continue to follow the patient.   HPI: Martin Thomas is a 67 y.o. male with past medical history of CAD s/p stent, traumatic L BKA, lung cancer, COPD, DM, HTN, and HLD who presented yesterday (5/3) with STEMI.  He was quite somnolent at the time of my consult, reported severe fatigue. He wears a Dexcom and reports that "some" sugars are high.  He uses 25 units Lantus qhs and 8 units qAC.   Review of Systems: As mentioned in the history of present illness. All other systems reviewed and are negative.  Past Medical History:   Diagnosis Date   Cancer (HCC)    lung cancer   COPD (chronic obstructive pulmonary disease) (HCC)    Diabetes mellitus without complication (HCC)    Hyperlipemia    Hypertension    Past Surgical History:  Procedure Laterality Date   APPENDECTOMY     BELOW KNEE LEG AMPUTATION  2009   left   CORONARY STENT PLACEMENT     LEFT HEART CATHETERIZATION WITH CORONARY ANGIOGRAM N/A 03/24/2012   Procedure: LEFT HEART CATHETERIZATION WITH CORONARY ANGIOGRAM;  Surgeon: Marykay Lex, MD;  Location: [facility] CATH LAB;  Service: Cardiovascular;  Laterality: N/A;   PERCUTANEOUS CORONARY STENT INTERVENTION (PCI-S)  03/24/2012   Procedure: PERCUTANEOUS CORONARY STENT INTERVENTION (PCI-S);  Surgeon: Marykay Lex, MD;  Location: [facility] CATH LAB;  Service: Cardiovascular;;   Social History:  reports that he has been smoking cigarettes. He has been smoking an average of 1.5 packs per day. He does not have any smokeless tobacco history on file. He reports that he does not drink alcohol and does not use drugs.  Allergies  Allergen Reactions   Contrast Media [Iodinated Contrast Media] Anaphylaxis   Ciprofloxacin     unknown    Family History  Problem Relation Age of Onset   CAD Other    Heart attack Father        died of second MI at 31   Cancer Father    Heart attack Mother    Heart attack Paternal Uncle 29       died of MI at 38   Diabetes Brother    Stroke Maternal Grandfather     Prior to Admission medications   Medication Sig Start  Date End Date Taking? Authorizing Provider  albuterol (PROVENTIL HFA;VENTOLIN HFA) 108 (90 BASE) MCG/ACT inhaler Inhale 2 puffs into the lungs every 6 (six) hours as needed for shortness of breath.     [provider]  aspirin EC 81 MG EC tablet Take 1 tablet (81 mg total) by mouth daily. 03/25/12   Dwana Melena, PA-C  atorvastatin (LIPITOR) 20 MG tablet Take 20 mg by mouth daily.    [provider]  budesonide-formoterol (SYMBICORT) 160-4.5 MCG/ACT  inhaler Inhale 2 puffs into the lungs 2 (two) times daily.    [provider]  clopidogrel (PLAVIX) 75 MG tablet Take 75 mg by mouth daily.    [provider]  cyclobenzaprine (FLEXERIL) 10 MG tablet Take 10 mg by mouth at bedtime.    [provider]  fenofibrate (TRICOR) 145 MG tablet Take 145 mg by mouth daily.    [provider]  gabapentin (NEURONTIN) 300 MG capsule Take 300 mg by mouth 3 (three) times daily.    [provider]  HYDROcodone-acetaminophen (NORCO/VICODIN) 5-325 MG per tablet Take 1 tablet by mouth every 8 (eight) hours as needed. For pain    [provider]  insulin regular human CONCENTRATED (HUMULIN R) 500 UNIT/ML SOLN injection Inject 0.2 Units into the skin. 0.2 at breakfast and lunch and 0.22 in the evening    [provider]  lisinopril (PRINIVIL,ZESTRIL) 20 MG tablet Take 10 mg by mouth daily.     [provider]  magnesium oxide (MAG-OX) 400 MG tablet Take 400 mg by mouth 2 (two) times daily.    [provider]  metoprolol tartrate (LOPRESSOR) 25 MG tablet Take 1 tablet (25 mg total) by mouth 2 (two) times daily. 03/25/12   Dwana Melena, PA-C  nitroGLYCERIN (NITROSTAT) 0.4 MG SL tablet Place 0.4 mg under the tongue every 5 (five) minutes as needed for chest pain.    [provider]  omega-3 acid ethyl esters (LOVAZA) 1 G capsule Take 2 g by mouth 2 (two) times daily.    [provider]  omeprazole (PRILOSEC) 20 MG capsule Take 20 mg by mouth daily.    [provider]  ranitidine (ZANTAC) 150 MG capsule Take 150 mg by mouth 2 (two) times daily.    [provider]  senna (SENOKOT) 8.6 MG TABS Take 1 tablet by mouth daily.    [provider]  traZODone (DESYREL) 50 MG tablet Take 50 mg by mouth at bedtime.    [provider]    Physical Exam: Vitals:   07/20/22 0700 07/20/22 0745 07/20/22 0800 07/20/22 1100  BP: 125/63 120/70 119/74    Pulse: 97 93 93   Resp: (!) 26 (!) 22 (!) 24   Temp:    (!) 96.9 F (36.1 C)  TempSrc:    Oral  SpO2: 96% 94% 94%   Weight:      Height:       General:  Appears calm and comfortable and is in NAD, very somnolent Eyes:  EOMI, normal lids, iris ENT:  grossly normal hearing, lips & tongue, mmm Neck:  no LAD, masses or thyromegaly Cardiovascular:  RRR, no m/r/g. No LE edema.  Respiratory:   CTA bilaterally with no wheezes/rales/rhonchi.  Normal respiratory effort. Abdomen:  soft, NT, ND Skin:  no rash or induration seen on limited exam Musculoskeletal:  grossly normal tone BUE/BLE, good ROM, no bony abnormality, s/p L BKA Psychiatric:  somnolent mood and affect, speech sparse  but appropriate Neurologic:  CN 2-12 grossly intact, moves all extremities in coordinated fashion   Radiological Exams on Admission: Independently reviewed - see discussion in A/P where applicable  ECHOCARDIOGRAM COMPLETE  Result Date: 07/19/2022    ECHOCARDIOGRAM REPORT   Patient Name:   KYNGSTEN FRAIL Date of Exam: 07/19/2022 Medical Rec #:  [id number]        Height:       68.0 in Accession #:    [id number]       Weight:       191.0 lb Date of Birth:  May 16, 1955         BSA:          2.004 m Patient Age:    67 years         BP:           119/67 mmHg Patient Gender: M                HR:           93 bpm. Exam Location:  Inpatient Procedure: 2D Echo, Cardiac Doppler and Color Doppler Indications:    acute ischemic heart disease  History:        Patient has prior history of Echocardiogram examinations and                 Patient has no prior history of Echocardiogram examinations.                 Risk Factors:Diabetes, Hypertension, Dyslipidemia and Current                 Smoker.  Sonographer:    Delcie Roch RDCS Referring Phys: 35 JAYADEEP S VARANASI IMPRESSIONS  1. Calcified noncoronary cusp with restricted motion; mild AS by doppler; mild to moderate AI.  2. Left ventricular ejection fraction, by estimation, is  45 to 50%. The left ventricle has mildly decreased function. The left ventricle demonstrates global hypokinesis. Left ventricular diastolic parameters are consistent with Grade I diastolic dysfunction (impaired relaxation).  3. Right ventricular systolic function is normal. The right ventricular size is normal.  4. The mitral valve is normal in structure. Trivial mitral valve regurgitation. No evidence of mitral stenosis.  5. The aortic valve is tricuspid. Aortic valve regurgitation is mild to moderate. Mild aortic valve stenosis.  6. The inferior vena cava is normal in size with greater than 50% respiratory variability, suggesting right atrial pressure of 3 mmHg. Comparison(s): No prior Echocardiogram. FINDINGS  Left Ventricle: Left ventricular ejection fraction, by estimation, is 45 to 50%. The left ventricle has mildly decreased function. The left ventricle demonstrates global hypokinesis. The left ventricular internal cavity size was normal in size. There is  no left ventricular hypertrophy. Left ventricular diastolic parameters are consistent with Grade I diastolic dysfunction (impaired relaxation). Right Ventricle: The right ventricular size is normal. Right ventricular systolic function is normal. Left Atrium: Left atrial size was normal in size. Right Atrium: Right atrial size was normal in size. Pericardium: There is no evidence of pericardial effusion. Mitral Valve: The mitral valve is normal in structure. Trivial mitral valve regurgitation. No evidence of mitral valve stenosis. Tricuspid Valve: The tricuspid valve is normal in structure. Tricuspid valve regurgitation is not demonstrated. No evidence of tricuspid stenosis. Aortic Valve: The aortic valve is tricuspid. Aortic valve regurgitation is mild to moderate. Aortic regurgitation PHT measures 273 msec. Mild aortic stenosis is present. Aortic valve mean gradient measures 10.0 mmHg. Aortic valve peak gradient measures 18.7  mmHg. Aortic valve area, by  VTI measures 1.69 cm. Pulmonic Valve: The pulmonic valve was normal in structure. Pulmonic valve regurgitation is not visualized. No evidence of pulmonic stenosis. Aorta: The aortic root is normal in size and structure. Venous: The inferior vena cava is normal in size with greater than 50% respiratory variability, suggesting right atrial pressure of 3 mmHg. IAS/Shunts: No atrial level shunt detected by color flow Doppler. Additional Comments: Calcified noncoronary cusp with restricted motion; mild AS by doppler; mild to moderate AI.  LEFT VENTRICLE PLAX 2D LVIDd:         5.00 cm     Diastology LVIDs:         3.90 cm     LV e' medial:    6.64 cm/s LV PW:         1.10 cm     LV E/e' medial:  13.9 LV IVS:        1.00 cm     LV e' lateral:   7.18 cm/s LVOT diam:     2.20 cm     LV E/e' lateral: 12.9 LV SV:         60 LV SV Index:   30 LVOT Area:     3.80 cm  LV Volumes (MOD) LV vol d, MOD A2C: 86.3 ml LV vol d, MOD A4C: 84.9 ml LV vol s, MOD A2C: 47.5 ml LV vol s, MOD A4C: 41.4 ml LV SV MOD A2C:     38.8 ml LV SV MOD A4C:     84.9 ml LV SV MOD BP:      43.4 ml RIGHT VENTRICLE            IVC RV Basal diam:  2.20 cm    IVC diam: 2.20 cm RV S prime:     9.14 cm/s TAPSE (M-mode): 1.8 cm LEFT ATRIUM           Index        RIGHT ATRIUM          Index LA diam:      3.00 cm 1.50 cm/m   RA Area:     8.67 cm LA Vol (A2C): 33.8 ml 16.87 ml/m  RA Volume:   15.00 ml 7.48 ml/m  AORTIC VALVE AV Area (Vmax):    1.64 cm AV Area (Vmean):   1.54 cm AV Area (VTI):     1.69 cm AV Vmax:           216.00 cm/s AV Vmean:          147.000 cm/s AV VTI:            0.355 m AV Peak Grad:      18.7 mmHg AV Mean Grad:      10.0 mmHg LVOT Vmax:         93.05 cm/s LVOT Vmean:        59.500 cm/s LVOT VTI:          0.158 m LVOT/AV VTI ratio: 0.44 AI PHT:            273 msec  AORTA Ao Root diam: 3.60 cm Ao Asc diam:  3.10 cm MV E velocity: 92.40 cm/s MV A velocity: 129.00 cm/s  SHUNTS MV E/A ratio:  0.72         Systemic VTI:  0.16 m  Systemic Diam: 2.20 cm Olga Millers MD Electronically signed by Olga Millers MD Signature Date/Time: 07/19/2022/5:55:56 PM    Final    CARDIAC CATHETERIZATION  Result Date: 07/19/2022   Prox RCA lesion is 75% stenosed.  A drug-eluting stent was successfully placed using a SYNERGY XD 3.50X12.   Post intervention, there is a 0% residual stenosis.   Previously placed Mid RCA stent in 2014 is  widely patent.   2014 Dist RCA stent is 100% stenosed.  Balloon angioplasty was performed using a BALL SAPPHIRE NC24 3.0X12.   Post intervention, there is a 0% residual stenosis.   RPAV lesion is 100% stenosed.Balloon angioplasty was performed using a BALL SAPPHIRE NC24 3.0X12.   Post intervention, there is a 0% residual stenosis.   RPDA lesion is 100% stenosed. Balloon angioplasty was performed using a BALLN EMERGE MR 2.5X12.   Post intervention, there is a 0% residual stenosis.   Mid LAD lesion is 60% stenosed.   2nd Mrg lesion is 50% stenosed.   Dist Cx lesion is 50% stenosed.   There is mild to moderate left ventricular systolic dysfunction.   LV end diastolic pressure is mildly elevated.   The left ventricular ejection fraction is 35-45% by visual estimate.   There is no aortic valve stenosis. 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  Thrombus occluded the stent and with angioplasty, subsequently occluded the posterior descending artery, posterolateral artery as well.  The distal RCA stent extended into the PDA and cover the ostium of the posterolateral artery.  All 3 areas of this bifurcation were ballooned to restore flow.  Proximally, there is a 75% focal stenosis in the RCA.  This was successfully stented.  The patient was treated aggressively with anticoagulation with IV heparin and antiplatelet agents.  He was loaded orally with Brilinta and IV Aggrastat was used. Will need to see what he was taking at home.  Clopidogrel is listed on his medicine list.  If he indeed thrombosed the  stent while on clopidogrel, certainly would need Brilinta going forward.  If he was actually off of his clopidogrel, would use Brilinta for a year and then consider long-term clopidogrel beyond that time. Moderate disease in the LAD.  Increase medical therapy.  Statin dose increased.  ECG is nearly normalized.  Will check echocardiogram.  Watch in ICU. Unable to get in touch with his emergency contact.      Labs on Admission: I have personally reviewed the available labs and imaging studies at the time of the admission.  Pertinent labs:    Na++ 126, 128 Glucose 454, 446, 403, 474, 385 BUN 26/Creatinine 1.45/GFR 53 -> 32/1.62/47 HS troponin 28, 18,090 WBC 17.1 -> 15.8 A1c 11.4  Family Communication: None present Primary team communication: I spoke directly with the cardiology PA at the time of the consult  Thank you very much for involving [place] in the care of your patient.  Author: Jonah Blue, MD 07/20/2022 1:09 PM  For on call review [email or web address].

## 2022-07-20 NOTE — Assessment & Plan Note (Signed)
-  Continue Symbicort (Dulera per formulary) and Albuterol

## 2022-07-20 NOTE — Assessment & Plan Note (Addendum)
-  Patent presented with STEMI -On ASA and Plavix PTA -Underwent RCA stent and multiple balloon angioplasties and now stable in ICU -Management per cardiology

## 2022-07-20 NOTE — Assessment & Plan Note (Signed)
-  Poor baseline control, A1c is 11.4 -Resume home glargine, 25 units qhs (currently on 10 units) -Add 8 units qAC (also takes at home) -Continue moderate-scale SSI

## 2022-07-20 NOTE — Assessment & Plan Note (Signed)
-   Continue lisinopril, metoprolol 

## 2022-07-20 NOTE — Inpatient Diabetes Management (Signed)
Inpatient Diabetes Program Recommendations  AACE/ADA: New Consensus Statement on Inpatient Glycemic Control (2015)  Target Ranges:  Prepandial:   less than 140 mg/dL      Peak postprandial:   less than 180 mg/dL (1-2 hours)      Critically ill patients:  140 - 180 mg/dL   Lab Results  Component Value Date   GLUCAP 385 (H) 07/20/2022   HGBA1C 11.5 (H) 07/20/2022    Review of Glycemic Control  Latest Reference Range & Units 07/19/22 17:13 07/19/22 21:38 07/20/22 06:40  Glucose-Capillary 70 - 99 mg/dL 098 (H) 119 (H) 147 (H)  (H): Data is abnormally high Diabetes history: Type 2 DM Outpatient Diabetes medications: U500 TID- 100 units B/L, 125 units D Current orders for Inpatient glycemic control: Novolog 0-15 units TID & HS, Semglee 10 units QHS Solumedrol 125 mg x 1  Inpatient Diabetes Program Recommendations:    Consider: -Adding Semglee 40 units QD -Adding Novolog 8 units TID (assuming patient consuming >50% of meals)  Thanks, Lujean Rave, MSN, RNC-OB Diabetes Coordinator [phone] (8a-5p)

## 2022-07-20 NOTE — Assessment & Plan Note (Signed)
-  Continue Tricor, Lovaza, Lipitor

## 2022-07-20 NOTE — Progress Notes (Signed)
   Rounding Note    Patient Name: Hamzeh Popwell Date of Encounter: 07/20/2022  [facility] Cardiologist: None   Subjective   NAEO. No CP this AM. Feels "better"  Vital Signs    Vitals:   07/20/22 0600 07/20/22 0700 07/20/22 0745 07/20/22 0800  BP: 122/81 125/63 120/70 119/74  Pulse: 93 97 93 93  Resp: (!) 28 (!) 26 (!) 22 (!) 24  Temp:      TempSrc:      SpO2: 93% 96% 94% 94%  Weight:      Height:        Intake/Output Summary (Last 24 hours) at 07/20/2022 0848 Last data filed at 07/20/2022 0400 Gross per 24 hour  Intake 375.73 ml  Output 1100 ml  Net -724.27 ml      07/19/2022    1:22 PM 09/28/2013    4:23 AM 09/28/2013   12:30 AM  Last 3 Weights  Weight (lbs) 191 lb 212 lb 15.4 oz 212 lb 15.4 oz  Weight (kg) 86.637 kg 96.6 kg 96.6 kg      Telemetry    Personally Reviewed  ECG     Personally Reviewed  Physical Exam   GEN: No acute distress.   Cardiac: RRR, no murmurs, rubs, or gallops.  Respiratory: Clear to auscultation bilaterally. Psych: Normal affect   Assessment & Plan    #Inferior STEMI CP free this AM after PCI yesterday. Cont ASA, ticagrelor, atorvastatin  #Acute heart failure #ICM EF 45% 2/2 STEMI ACE/ARB held in setting of abnormal kidney function. Cont BB for now  #Hyperglycemia Internal medicine consult to assist with diabetes control  OK to transfer to tele today.    Sheria Lang T. Lalla Brothers, MD, [facility], [facility] Cardiac Electrophysiology

## 2022-07-21 DIAGNOSIS — E1165 Type 2 diabetes mellitus with hyperglycemia: Secondary | ICD-10-CM | POA: Diagnosis not present

## 2022-07-21 DIAGNOSIS — G894 Chronic pain syndrome: Secondary | ICD-10-CM | POA: Diagnosis not present

## 2022-07-21 DIAGNOSIS — Z794 Long term (current) use of insulin: Secondary | ICD-10-CM

## 2022-07-21 DIAGNOSIS — J449 Chronic obstructive pulmonary disease, unspecified: Secondary | ICD-10-CM | POA: Diagnosis not present

## 2022-07-21 DIAGNOSIS — E7800 Pure hypercholesterolemia, unspecified: Secondary | ICD-10-CM

## 2022-07-21 DIAGNOSIS — I10 Essential (primary) hypertension: Secondary | ICD-10-CM

## 2022-07-21 DIAGNOSIS — I2119 ST elevation (STEMI) myocardial infarction involving other coronary artery of inferior wall: Secondary | ICD-10-CM | POA: Diagnosis not present

## 2022-07-21 LAB — BASIC METABOLIC PANEL
Anion gap: 10 (ref 5–15)
BUN: 37 mg/dL — ABNORMAL HIGH (ref 8–23)
CO2: 23 mmol/L (ref 22–32)
Calcium: 8.7 mg/dL — ABNORMAL LOW (ref 8.9–10.3)
Chloride: 97 mmol/L — ABNORMAL LOW (ref 98–111)
Creatinine, Ser: 1.6 mg/dL — ABNORMAL HIGH (ref 0.61–1.24)
GFR, Estimated: 47 mL/min — ABNORMAL LOW (ref >60)
Glucose, Bld: 256 mg/dL — ABNORMAL HIGH (ref 70–99)
Potassium: 4.4 mmol/L (ref 3.5–5.1)
Sodium: 130 mmol/L — ABNORMAL LOW (ref 135–145)

## 2022-07-21 LAB — GLUCOSE, CAPILLARY
Glucose-Capillary: 334 mg/dL — ABNORMAL HIGH (ref 70–99)
Glucose-Capillary: 235 mg/dL — ABNORMAL HIGH (ref 70–99)
Glucose-Capillary: 171 mg/dL — ABNORMAL HIGH (ref 70–99)
Glucose-Capillary: 281 mg/dL — ABNORMAL HIGH (ref 70–99)

## 2022-07-21 LAB — MAGNESIUM: Magnesium: 1.8 mg/dL (ref 1.7–2.4)

## 2022-07-21 MED ORDER — INSULIN GLARGINE-YFGN 100 UNIT/ML ~~LOC~~ SOLN: 40.0000 [IU] | Freq: Every day | SUBCUTANEOUS | Status: DC

## 2022-07-21 MED ORDER — INSULIN GLARGINE-YFGN 100 UNIT/ML ~~LOC~~ SOLN
40.0000 [IU] | Freq: Every day | SUBCUTANEOUS | Status: DC
  Administered 2022-07-21 – 2022-07-23 (×3): 40 [IU] via SUBCUTANEOUS
  Filled 2022-07-21 (×4): qty 0.4

## 2022-07-21 NOTE — Inpatient Diabetes Management (Signed)
Inpatient Diabetes Program Recommendations  AACE/ADA: New Consensus Statement on Inpatient Glycemic Control (2015)  Target Ranges:  Prepandial:   less than 140 mg/dL      Peak postprandial:   less than 180 mg/dL (1-2 hours)      Critically ill patients:  140 - 180 mg/dL   Lab Results  Component Value Date   GLUCAP 334 (H) 07/21/2022   HGBA1C 11.5 (H) 07/20/2022    Review of Glycemic Control  Latest Reference Range & Units 07/20/22 14:22 07/20/22 16:35 07/20/22 21:50 07/21/22 06:47  Glucose-Capillary 70 - 99 mg/dL 161 (H) 096 (H) 045 (H) 334 (H)  (H): Data is abnormally high Diabetes history: Type 2 DM Outpatient Diabetes medications: U500 TID- 100 units B/L, 125 units D  Current orders for Inpatient glycemic control: Novolog 0-15 units TID & HS, Semglee 40 units QHS, Novolog 8 units TID   Inpatient Diabetes Program Recommendations:     Spoke with patient regarding outpatient diabetes management. Patient reports recently being switched from U500 to " a long acting at night around 24 and 8 with my meals". Cannot remember the names of the insulins, however reports compliance in the last few weeks.  Reviewed patient's current A1c of 11.5%. Explained what a A1c is and what it measures. Also reviewed goal A1c with patient, importance of good glucose control @ home, and blood sugar goals. Reviewed patho of DM, need for improved control, survival skills, when to contact MD, impact from cardiac perspective, vascular changes and commorbidities.  Patient wears Dexcom G6 and reports sugars are always over 200 mg/d:. Encouraged to reach out to PCP to ensure he achieves goals. Patient in agreement.  No further questions at this time.   DM coordinator working remotely to cover multiple campuses over the weekend. Please page with immediate concerns.   Thanks, Lujean Rave, MSN, RNC-OB Diabetes Coordinator [phone] (8a-5p)

## 2022-07-21 NOTE — Progress Notes (Signed)
   Rounding Note    Patient Name: [name] Date of Encounter: 07/21/2022  Martin Thomas: None   Subjective   NAEO. BGL still significantly elevated.  Vital Signs    Vitals:   07/21/22 0600 07/21/22 0700 07/21/22 0725 07/21/22 0837  BP:    123/72  Pulse: 95 100 94 99  Resp: (!) 22 (!) 25 17 19   Temp:    (!) 97.5 F (36.4 C)  TempSrc:    Oral  SpO2: 96% 97% 97% 95%  Weight:      Height:        Intake/Output Summary (Last 24 hours) at 07/21/2022 1012 Last data filed at 07/21/2022 0835 Gross per 24 hour  Intake 1257 ml  Output 1850 ml  Net -593 ml       07/21/2022    4:05 AM 07/19/2022    1:22 PM 09/28/2013    4:23 AM  Last 3 Weights  Weight (lbs) 179 lb 7.3 oz 191 lb 212 lb 15.4 oz  Weight (kg) 81.4 kg 86.637 kg 96.6 kg      Telemetry    Personally Reviewed  ECG     Personally Reviewed  Physical Exam   GEN: No acute distress.   Cardiac: RRR, no murmurs, rubs, or gallops.  Respiratory: Clear to auscultation bilaterally. Psych: Normal affect   Assessment & Plan    #Inferior STEMI CP free this AM after PCI yesterday. Cont ASA, ticagrelor, atorvastatin  #Acute heart failure #ICM EF 45% 2/2 STEMI ACE/ARB held in setting of abnormal kidney function. Cont BB for now Repeat BMP/Mg pending.  #Hyperglycemia Internal medicine consult to assist with diabetes control I would like his BGL to be better controlled prior to discharge. Tentative discharge date Monday.    Martin Thomas T. Lalla Brothers, MD, [facility], [facility] Cardiac Electrophysiology

## 2022-07-21 NOTE — Progress Notes (Signed)
PROGRESS NOTE    Martin Thomas  OZH:[id number] DOB: 1955-05-13 DOA: 07/19/2022 PCP: Default, Provider, MD   Brief Narrative:  Martin Thomas is a 67 y.o. male with past medical history of CAD s/p stent, traumatic L BKA, lung cancer, COPD, DM, HTN, and HLD who presented yesterday (5/3) with STEMI.  He continues to report severe fatigue. He wears a Dexcom and reports that "some" sugars are high.  He uses 25 units Lantus qhs and 8 units qAC. Admitted and underwent RCA stenting and multiple balloon angioplasties. Cardiology consulted [facility] to assist with Medical Management of his uncontrolled Diabetes Mellitus Type 2. Further Insulin adjustments are being made.  Assessment and Plan: * Acute inferior myocardial infarction (HCC) -Patent presented with STEMI -Troponin went from 28 -> 18,090 -On ASA 81 mg po daily and was changed from Plavix to Ticagrelor 90 mg po BID -C/w Metoprolol Tartrate 25 mg po BID, NTG 0.4 mg SL q46min -Underwent RCA stent and multiple balloon angioplasties and now stable in ICU and transferred to the Floor to Cardiac Telemetry -Management per cardiology  Uncontrolled DM2 (diabetes mellitus, type 2) (HCC) -Poor baseline control, A1c is 11.5 -Resumed home glargine, 25 units qhs but increased to 40 units sq Daily  -Add 8 units qAC (also takes at home) -Continue Moderate Novology SSI AC/HS -Continue to Monitor CBGs carefully and adjust as necessary -CBG Trend: Recent Labs  Lab 07/20/22 0640 07/20/22 1145 07/20/22 1422 07/20/22 1635 07/20/22 2150 07/21/22 0647 07/21/22 1046  GLUCAP 385* 348* 400* 328* 382* 334* 235*  -Diabetes Education Coordinator Following and will need further diabetic teaching and outpatient follow up   Chronic Pain Disorder -Dr. Ophelia Charter have reviewed this patient in the [place] Controlled Substances Reporting System.  He is receiving medications from only one provider and appears to be taking them as prescribed. -He is not at particularly high risk  of opioid misuse, diversion, or overdose.  -Continue cyclobenzaprine, gabapentin, Vicodin  COPD (chronic obstructive pulmonary disease) (HCC) -Continue Symbicort substitution with Dulera per formulary and Albuterol 2.5 mg Neb q6hprn Wheezing -SpO2: 97 % O2 Flow Rate (L/min): 3 L/min  HLD -Lipid Panel done and showed total cholesterol/HDL ratio of 11.0, so level 363, HDL 33, unable to calculate LDL, direct LDL was 115, triglycerides were 1003 and VLDL was unable to be calculated -Continue Lovaza 2 grams po BID, Atrovastatin 80 mg po Daily   HTN (hypertension) -Continue Metoprolol Tartrate 25 mg po BID  Hyponatremia -Improving -Na+ Trend: Recent Labs  Lab 07/19/22 1327 07/19/22 1344 07/20/22 0638 07/21/22 1005  NA 126* 118* 128* 130*  -Continue to Monitor and Trend and repeat CMP in the AM   CKD Stage 3a -BUN/Cr Trend: Recent Labs  Lab 07/19/22 1327 07/19/22 1344 07/20/22 0638 07/21/22 1005  BUN 26* 26* 32* 37*  CREATININE 1.45* 1.10 1.62* 1.60*  -Avoid Nephrotoxic Medications, Contrast Dyes, Hypotension and Dehydration to Ensure Adequate Renal Perfusion and will need to Renally Adjust Meds -Continue to Monitor and Trend Renal Function carefully and repeat BMP/CMP in the AM   GERD/GI Prophylaxis -C/w Pantoprazole 40 mg po Daily   Leukocytosis -Likely reactive in the setting of  his STEMI -WBC Trend:  Recent Labs  Lab 07/19/22 1327 07/20/22 0638  WBC 17.1* 15.9*  -Continue to Monitor for S/Sx of Infection; Repeat CBC intermittently  Hypoalbuminemia -Patient's Albumin Trend: Recent Labs  Lab 07/19/22 1327  ALBUMIN 2.6*  -Continue to Monitor and Trend and repeat CMP in the AM  DVT prophylaxis: SCDs, Per cardiology  Code Status: Full Code Family Communication: No family present at bedside   Disposition Plan:  Level of care: Telemetry Cardiac Status is: Inpatient Remains inpatient appropriate because: Will need further blood sugar control and discharge  disposition and timing per primary team   Consultants:  [facility] (Primary)  Procedures:  Cardiac Cath  Antimicrobials:  Anti-infectives (From admission, onward)    None       Subjective: Seen and examined at bedside and states he is fatigued and wanting to sleep.  States that he is not really having very much chest pain.  No nausea or vomiting.  No other concerns or complaints at this time.  Objective: Vitals:   07/21/22 0700 07/21/22 0725 07/21/22 0837 07/21/22 1048  BP:   123/72 116/66  Pulse: 100 94 99 90  Resp: (!) 25 17 19 20   Temp:   (!) 97.5 F (36.4 C) 98 F (36.7 C)  TempSrc:   Oral Oral  SpO2: 97% 97% 95% 97%  Weight:      Height:        Intake/Output Summary (Last 24 hours) at 07/21/2022 1339 Last data filed at 07/21/2022 1322 Gross per 24 hour  Intake 1294 ml  Output 2000 ml  Net -706 ml   Filed Weights   07/19/22 1322 07/21/22 0405  Weight: 86.6 kg 81.4 kg   Examination: Physical Exam:  Constitutional: WN/WD overweight Caucasian male in no acute distress appears fatigued Respiratory: Diminished to auscultation bilaterally, no wheezing, rales, rhonchi or crackles. Normal respiratory effort and patient is not tachypenic. No accessory muscle use.  Unlabored breathing Cardiovascular: RRR, no murmurs / rubs / gallops. S1 and S2 auscultated.  Trace extremity edema and has a left BKA Abdomen: Soft, non-tender, distended secondary to body habitus. Bowel sounds positive.  GU: Deferred. Musculoskeletal: No clubbing / cyanosis of digits/nails.  Has a left BKA Skin: No rashes, lesions, ulcers. No induration; Warm and dry.  Neurologic: CN 2-12 grossly intact with no focal deficits. Romberg sign and cerebellar reflexes not assessed.  Psychiatric: Normal judgment and insight. Alert and oriented x 3. Normal mood and appropriate affect.   Data Reviewed: I have personally reviewed following labs and imaging studies  CBC: Recent Labs  Lab 07/19/22 1327  07/19/22 1344 07/20/22 0638  WBC 17.1*  --  15.9*  NEUTROABS 14.0*  --   --   HGB 13.7 13.9 13.7  HCT 39.8 41.0 39.9  MCV 92.8  --  91.9  PLT 430*  --  364   Basic Metabolic Panel: Recent Labs  Lab 07/19/22 1327 07/19/22 1344 07/19/22 1751 07/20/22 0638 07/21/22 1005  NA 126* 118*  --  128* 130*  K 4.7 4.8  --  5.4* 4.4  CL 95* 89*  --  96* 97*  CO2 19*  --   --  21* 23  GLUCOSE 454* 416* 428* 382* 256*  BUN 26* 26*  --  32* 37*  CREATININE 1.45* 1.10  --  1.62* 1.60*  CALCIUM 8.5*  --   --  9.1 8.7*  MG  --   --   --   --  1.8   GFR: Estimated Creatinine Clearance: 43.9 mL/min (A) (by C-G formula based on SCr of 1.6 mg/dL (H)). Liver Function Tests: Recent Labs  Lab 07/19/22 1327  AST 22  ALT 24  ALKPHOS 176*  BILITOT 0.5  PROT 6.0*  ALBUMIN 2.6*   No results for input(s): "LIPASE", "AMYLASE" in the last 168 hours. No results for  input(s): "AMMONIA" in the last 168 hours. Coagulation Profile: Recent Labs  Lab 07/19/22 1327  INR 1.0   Cardiac Enzymes: No results for input(s): "CKTOTAL", "CKMB", "CKMBINDEX", "TROPONINI" in the last 168 hours. BNP (last 3 results) No results for input(s): "PROBNP" in the last 8760 hours. HbA1C: Recent Labs    07/19/22 1321 07/20/22 0638  HGBA1C 11.4* 11.5*   CBG: Recent Labs  Lab 07/20/22 1422 07/20/22 1635 07/20/22 2150 07/21/22 0647 07/21/22 1046  GLUCAP 400* 328* 382* 334* 235*   Lipid Profile: Recent Labs    07/19/22 1321  CHOL 363*  HDL 33*  LDLCALC UNABLE TO CALCULATE IF TRIGLYCERIDE OVER 400 mg/dL  TRIG 4,098*  CHOLHDL 11.9  LDLDIRECT 115*   Thyroid Function Tests: No results for input(s): "TSH", "T4TOTAL", "FREET4", "T3FREE", "THYROIDAB" in the last 72 hours. Anemia Panel: No results for input(s): "VITAMINB12", "FOLATE", "FERRITIN", "TIBC", "IRON", "RETICCTPCT" in the last 72 hours. Sepsis Labs: No results for input(s): "PROCALCITON", "LATICACIDVEN" in the last 168 hours.  Recent Results  (from the past 240 hour(s))  MRSA Next Gen by PCR, Nasal     Status: None   Collection Time: 07/19/22  4:21 PM   Specimen: Nasal Mucosa; Nasal Swab  Result Value Ref Range Status   MRSA by PCR Next Gen NOT DETECTED NOT DETECTED Final    Comment: (NOTE) The GeneXpert MRSA Assay (FDA approved for NASAL specimens only), is one component of a comprehensive MRSA colonization surveillance program. It is not intended to diagnose MRSA infection nor to guide or monitor treatment for MRSA infections. Test performance is not FDA approved in patients less than 61 years old. Performed at [facility], [address] [place], [place] [address]     Radiology Studies: ECHOCARDIOGRAM COMPLETE  Result Date: 07/19/2022    ECHOCARDIOGRAM REPORT   Patient Name:   Martin Thomas Date of Exam: 07/19/2022 Medical Rec #:  [id number]        Height:       68.0 in Accession #:    [id number]       Weight:       191.0 lb Date of Birth:  08/22/1955         BSA:          2.004 m Patient Age:    67 years         BP:           119/67 mmHg Patient Gender: M                HR:           93 bpm. Exam Location:  Inpatient Procedure: 2D Echo, Cardiac Doppler and Color Doppler Indications:    acute ischemic heart disease  History:        Patient has prior history of Echocardiogram examinations and                 Patient has no prior history of Echocardiogram examinations.                 Risk Factors:Diabetes, Hypertension, Dyslipidemia and Current                 Smoker.  Sonographer:    Delcie Roch RDCS Referring Phys: [id number] JAYADEEP S VARANASI IMPRESSIONS  1. Calcified noncoronary cusp with restricted motion; mild AS by doppler; mild to moderate AI.  2. Left ventricular ejection fraction, by estimation, is 45 to 50%. The left ventricle has mildly decreased function.  The left ventricle demonstrates global hypokinesis. Left ventricular diastolic parameters are consistent with Grade I diastolic dysfunction (impaired  relaxation).  3. Right ventricular systolic function is normal. The right ventricular size is normal.  4. The mitral valve is normal in structure. Trivial mitral valve regurgitation. No evidence of mitral stenosis.  5. The aortic valve is tricuspid. Aortic valve regurgitation is mild to moderate. Mild aortic valve stenosis.  6. The inferior vena cava is normal in size with greater than 50% respiratory variability, suggesting right atrial pressure of 3 mmHg. Comparison(s): No prior Echocardiogram. FINDINGS  Left Ventricle: Left ventricular ejection fraction, by estimation, is 45 to 50%. The left ventricle has mildly decreased function. The left ventricle demonstrates global hypokinesis. The left ventricular internal cavity size was normal in size. There is  no left ventricular hypertrophy. Left ventricular diastolic parameters are consistent with Grade I diastolic dysfunction (impaired relaxation). Right Ventricle: The right ventricular size is normal. Right ventricular systolic function is normal. Left Atrium: Left atrial size was normal in size. Right Atrium: Right atrial size was normal in size. Pericardium: There is no evidence of pericardial effusion. Mitral Valve: The mitral valve is normal in structure. Trivial mitral valve regurgitation. No evidence of mitral valve stenosis. Tricuspid Valve: The tricuspid valve is normal in structure. Tricuspid valve regurgitation is not demonstrated. No evidence of tricuspid stenosis. Aortic Valve: The aortic valve is tricuspid. Aortic valve regurgitation is mild to moderate. Aortic regurgitation PHT measures 273 msec. Mild aortic stenosis is present. Aortic valve mean gradient measures 10.0 mmHg. Aortic valve peak gradient measures 18.7 mmHg. Aortic valve area, by VTI measures 1.69 cm. Pulmonic Valve: The pulmonic valve was normal in structure. Pulmonic valve regurgitation is not visualized. No evidence of pulmonic stenosis. Aorta: The aortic root is normal in size and  structure. Venous: The inferior vena cava is normal in size with greater than 50% respiratory variability, suggesting right atrial pressure of 3 mmHg. IAS/Shunts: No atrial level shunt detected by color flow Doppler. Additional Comments: Calcified noncoronary cusp with restricted motion; mild AS by doppler; mild to moderate AI.  LEFT VENTRICLE PLAX 2D LVIDd:         5.00 cm     Diastology LVIDs:         3.90 cm     LV e' medial:    6.64 cm/s LV PW:         1.10 cm     LV E/e' medial:  13.9 LV IVS:        1.00 cm     LV e' lateral:   7.18 cm/s LVOT diam:     2.20 cm     LV E/e' lateral: 12.9 LV SV:         60 LV SV Index:   30 LVOT Area:     3.80 cm  LV Volumes (MOD) LV vol d, MOD A2C: 86.3 ml LV vol d, MOD A4C: 84.9 ml LV vol s, MOD A2C: 47.5 ml LV vol s, MOD A4C: 41.4 ml LV SV MOD A2C:     38.8 ml LV SV MOD A4C:     84.9 ml LV SV MOD BP:      43.4 ml RIGHT VENTRICLE            IVC RV Basal diam:  2.20 cm    IVC diam: 2.20 cm RV S prime:     9.14 cm/s TAPSE (M-mode): 1.8 cm LEFT ATRIUM  Index        RIGHT ATRIUM          Index LA diam:      3.00 cm 1.50 cm/m   RA Area:     8.67 cm LA Vol (A2C): 33.8 ml 16.87 ml/m  RA Volume:   15.00 ml 7.48 ml/m  AORTIC VALVE AV Area (Vmax):    1.64 cm AV Area (Vmean):   1.54 cm AV Area (VTI):     1.69 cm AV Vmax:           216.00 cm/s AV Vmean:          147.000 cm/s AV VTI:            0.355 m AV Peak Grad:      18.7 mmHg AV Mean Grad:      10.0 mmHg LVOT Vmax:         93.05 cm/s LVOT Vmean:        59.500 cm/s LVOT VTI:          0.158 m LVOT/AV VTI ratio: 0.44 AI PHT:            273 msec  AORTA Ao Root diam: 3.60 cm Ao Asc diam:  3.10 cm MV E velocity: 92.40 cm/s MV A velocity: 129.00 cm/s  SHUNTS MV E/A ratio:  0.72         Systemic VTI:  0.16 m                             Systemic Diam: 2.20 cm Olga Millers MD Electronically signed by Olga Millers MD Signature Date/Time: 07/19/2022/5:55:56 PM    Final     Scheduled Meds:  aspirin  81 mg Oral Daily    atorvastatin  80 mg Oral Daily   cyclobenzaprine  10 mg Oral QHS   gabapentin  300 mg Oral TID   insulin aspart  0-15 Units Subcutaneous TID WC & HS   insulin aspart  8 Units Subcutaneous TID WC   insulin glargine-yfgn  40 Units Subcutaneous Daily   magnesium oxide  400 mg Oral BID   metoprolol tartrate  25 mg Oral BID   mometasone-formoterol  2 puff Inhalation BID   omega-3 acid ethyl esters  2 g Oral BID   pantoprazole  40 mg Oral Daily   senna  1 tablet Oral Daily   sodium chloride flush  3 mL Intravenous Q12H   ticagrelor  90 mg Oral BID   traZODone  50 mg Oral QHS   Continuous Infusions:  sodium chloride      LOS: 2 days   Marguerita Merles, DO [facility] Available via Epic secure chat 7am-7pm After these hours, please refer to coverage provider listed on [email or web address] 07/21/2022, 1:39 PM

## 2022-07-22 ENCOUNTER — Encounter (HOSPITAL_COMMUNITY): Payer: Self-pay | Admitting: Interventional Cardiology

## 2022-07-22 DIAGNOSIS — Z006 Encounter for examination for normal comparison and control in clinical research program: Secondary | ICD-10-CM

## 2022-07-22 DIAGNOSIS — E782 Mixed hyperlipidemia: Secondary | ICD-10-CM | POA: Diagnosis not present

## 2022-07-22 DIAGNOSIS — G894 Chronic pain syndrome: Secondary | ICD-10-CM | POA: Diagnosis not present

## 2022-07-22 DIAGNOSIS — Z720 Tobacco use: Secondary | ICD-10-CM

## 2022-07-22 DIAGNOSIS — I2119 ST elevation (STEMI) myocardial infarction involving other coronary artery of inferior wall: Secondary | ICD-10-CM | POA: Diagnosis not present

## 2022-07-22 DIAGNOSIS — J449 Chronic obstructive pulmonary disease, unspecified: Secondary | ICD-10-CM | POA: Diagnosis not present

## 2022-07-22 DIAGNOSIS — E1165 Type 2 diabetes mellitus with hyperglycemia: Secondary | ICD-10-CM | POA: Diagnosis not present

## 2022-07-22 LAB — CBC WITH DIFFERENTIAL/PLATELET
Abs Immature Granulocytes: 0.07 10*3/uL (ref 0.00–0.07)
Basophils Absolute: 0.1 10*3/uL (ref 0.0–0.1)
Basophils Relative: 1 %
Eosinophils Absolute: 0.3 10*3/uL (ref 0.0–0.5)
Eosinophils Relative: 3 %
HCT: 35.4 % — ABNORMAL LOW (ref 39.0–52.0)
Hemoglobin: 11.8 g/dL — ABNORMAL LOW (ref 13.0–17.0)
Immature Granulocytes: 1 %
Lymphocytes Relative: 25 %
Lymphs Abs: 2.8 10*3/uL (ref 0.7–4.0)
MCH: 32.1 pg (ref 26.0–34.0)
MCHC: 33.3 g/dL (ref 30.0–36.0)
MCV: 96.2 fL (ref 80.0–100.0)
Monocytes Absolute: 0.8 10*3/uL (ref 0.1–1.0)
Monocytes Relative: 7 %
Neutro Abs: 7.4 10*3/uL (ref 1.7–7.7)
Neutrophils Relative %: 63 %
Platelets: 345 10*3/uL (ref 150–400)
RBC: 3.68 MIL/uL — ABNORMAL LOW (ref 4.22–5.81)
RDW: 16.6 % — ABNORMAL HIGH (ref 11.5–15.5)
WBC: 11.4 10*3/uL — ABNORMAL HIGH (ref 4.0–10.5)
nRBC: 0 % (ref 0.0–0.2)

## 2022-07-22 LAB — COMPREHENSIVE METABOLIC PANEL
ALT: 22 U/L (ref 0–44)
AST: 30 U/L (ref 15–41)
Albumin: 2.4 g/dL — ABNORMAL LOW (ref 3.5–5.0)
Alkaline Phosphatase: 156 U/L — ABNORMAL HIGH (ref 38–126)
Anion gap: 8 (ref 5–15)
BUN: 35 mg/dL — ABNORMAL HIGH (ref 8–23)
CO2: 23 mmol/L (ref 22–32)
Calcium: 8.3 mg/dL — ABNORMAL LOW (ref 8.9–10.3)
Chloride: 100 mmol/L (ref 98–111)
Creatinine, Ser: 1.63 mg/dL — ABNORMAL HIGH (ref 0.61–1.24)
GFR, Estimated: 46 mL/min — ABNORMAL LOW (ref >60)
Glucose, Bld: 162 mg/dL — ABNORMAL HIGH (ref 70–99)
Potassium: 4.5 mmol/L (ref 3.5–5.1)
Sodium: 131 mmol/L — ABNORMAL LOW (ref 135–145)
Total Bilirubin: 0.2 mg/dL — ABNORMAL LOW (ref 0.3–1.2)
Total Protein: 5.3 g/dL — ABNORMAL LOW (ref 6.5–8.1)

## 2022-07-22 LAB — GLUCOSE, CAPILLARY
Glucose-Capillary: 187 mg/dL — ABNORMAL HIGH (ref 70–99)
Glucose-Capillary: 312 mg/dL — ABNORMAL HIGH (ref 70–99)
Glucose-Capillary: 135 mg/dL — ABNORMAL HIGH (ref 70–99)
Glucose-Capillary: 172 mg/dL — ABNORMAL HIGH (ref 70–99)

## 2022-07-22 LAB — PHOSPHORUS: Phosphorus: 4 mg/dL (ref 2.5–4.6)

## 2022-07-22 LAB — MAGNESIUM: Magnesium: 1.8 mg/dL (ref 1.7–2.4)

## 2022-07-22 MED ORDER — INSULIN ASPART 100 UNIT/ML IJ SOLN
12.0000 [IU] | Freq: Three times a day (TID) | INTRAMUSCULAR | Status: DC
  Administered 2022-07-22 – 2022-07-23 (×3): 12 [IU] via SUBCUTANEOUS

## 2022-07-22 MED ORDER — STUDY - EVOLVE-MI - EVOLOCUMAB (REPATHA) 140 MG/ML SQ INJECTION (PI-STUCKEY)
140.0000 mg | INJECTION | SUBCUTANEOUS | Status: DC
  Administered 2022-07-22: 140 mg via SUBCUTANEOUS
  Filled 2022-07-22: qty 1

## 2022-07-22 MED FILL — Lidocaine HCl Local Preservative Free (PF) Inj 1%: INTRAMUSCULAR | Qty: 30 | Status: AC

## 2022-07-22 NOTE — Care Management Important Message (Signed)
Important Message  Patient Details  Name: Martin Thomas MRN: [id number] Date of Birth: Dec 13, 1955   Medicare Important Message Given:  Yes     Renie Ora 07/22/2022, 10:20 AM

## 2022-07-22 NOTE — Progress Notes (Signed)
PROGRESS NOTE    Martin Thomas  ZOX:[id number] DOB: 02/09/1956 DOA: 07/19/2022 PCP: Default, Provider, MD   Brief Narrative:  Martin Thomas is a 67 y.o. male with past medical history of CAD s/p stent, traumatic L BKA, lung cancer, COPD, DM, HTN, and HLD who presented yesterday (5/3) with STEMI.  He continues to report severe fatigue. He wears a Dexcom and reports that "some" sugars are high.  He uses 25 units Lantus qhs and 8 units qAC. Admitted and underwent RCA stenting and multiple balloon angioplasties. Cardiology consulted [facility] to assist with Medical Management of his uncontrolled Diabetes Mellitus Type 2. Further Insulin adjustments are being made.   Assessment and Plan: * Acute Inferior Myocardial Infarction (HCC) -Patent presented with STEMI -Troponin went from 28 -> 18,090 -On ASA 81 mg po daily and was changed from Plavix to Ticagrelor 90 mg po BID -C/w Metoprolol Tartrate 25 mg po BID, NTG 0.4 mg SL q59min -Underwent RCA stent and multiple balloon angioplasties and now stable in ICU and transferred to the Floor to Cardiac Telemetry -Further Management per Cardiology -Unable to ambulate and unable to wear prosthesis for his cardiac rehab -Being referred to [facility] -Patient has been involved in the [facility] [place]   Uncontrolled DM2 (diabetes mellitus, type 2) (HCC) -Poor baseline control, A1c is 11.5 -Resumed home glargine, 25 units qhs but increased to 40 units sq Daily  -Add 8 units qAC (also takes at home) but ill increase to 12 units TIDwm -Continue Moderate Novology SSI AC/HS -Continue to Monitor CBGs carefully and adjust as necessary -CBG Trend: Recent Labs  Lab 07/21/22 0647 07/21/22 1046 07/21/22 1538 07/21/22 2112 07/22/22 0638 07/22/22 1101 07/22/22 1554  GLUCAP 334* 235* 171* 281* 187* 312* 135*  -Diabetes Education Coordinator Following and will need further diabetic teaching and outpatient follow up    Chronic Pain Disorder -Dr. Ophelia Charter have reviewed  this patient in the [place] Controlled Substances Reporting System.  He is receiving medications from only one provider and appears to be taking them as prescribed. -He is not at particularly high risk of opioid misuse, diversion, or overdose.  -Continue cyclobenzaprine, gabapentin, Vicodin   COPD (chronic obstructive pulmonary disease) (HCC) -Continue Symbicort substitution with Dulera per formulary and Albuterol 2.5 mg Neb q6hprn Wheezing -SpO2: 99 % O2 Flow Rate (L/min): 2 L/min -Continue to monitor respiratory status carefully and he wear supplemental oxygen at home   HLD -Lipid Panel done and showed total cholesterol/HDL ratio of 11.0, so level 363, HDL 33, unable to calculate LDL, direct LDL was 115, triglycerides were 1003 and VLDL was unable to be calculated -Continue Lovaza 2 grams po BID, Atrovastatin 80 mg po Daily    HTN (Hypertension) -Continue Metoprolol Tartrate 25 mg po BID -Continue to Monitor BP per Protocol -Last BP reading was 112/52   Hyponatremia -Improving -Na+ Trend: Recent Labs  Lab 07/19/22 1327 07/19/22 1344 07/20/22 0638 07/21/22 1005 07/22/22 0039  NA 126* 118* 128* 130* 131*  -Continue to Monitor and Trend and repeat CMP in the AM    CKD Stage 3a -BUN/Cr Trend: Recent Labs  Lab 07/19/22 1327 07/19/22 1344 07/20/22 0638 07/21/22 1005 07/22/22 0039  BUN 26* 26* 32* 37* 35*  CREATININE 1.45* 1.10 1.62* 1.60* 1.63*  -Avoid Nephrotoxic Medications, Contrast Dyes, Hypotension and Dehydration to Ensure Adequate Renal Perfusion and will need to Renally Adjust Meds -Continue to Monitor and Trend Renal Function carefully and repeat BMP/CMP in the AM    GERD/GI Prophylaxis -C/w Pantoprazole 40  mg po Daily    Leukocytosis -Likely reactive in the setting of  his STEMI -WBC Trend:  Recent Labs  Lab 07/19/22 1327 07/20/22 0638 07/22/22 0039  WBC 17.1* 15.9* 11.4*  -Continue to Monitor for S/Sx of Infection; Repeat CBC intermittently    Hypoalbuminemia -Patient's Albumin Trend: Recent Labs  Lab 07/19/22 1327 07/22/22 0039  ALBUMIN 2.6* 2.4*  -Continue to Monitor and Trend and repeat CMP in the AM   DVT prophylaxis: SCDs, Per Cardiology    Code Status: Full Code Family Communication: No family currently at bedside  Disposition Plan:  Level of care: Telemetry Cardiac Status is: Inpatient Remains inpatient appropriate because: Disposition as per primary team.  From a medical standpoint patient is medically stable to be discharged PT OT recommending a follow-up   Consultants:  [facility] Cardiology (Primary)  Procedures:  Cardiac Cath  Antimicrobials:  Anti-infectives (From admission, onward)    None       Subjective: Seen and examined at bedside and he is doing okay.  Denies any complaints and states he has no chest pain.  No nausea or vomiting.  Feels well otherwise.  Blood sugars were elevated still but improved.  From medical standpoint he can be discharged so cardiology wanted PT to evaluate.  No other concerns or complaints at this time.  Objective: Vitals:   07/22/22 0708 07/22/22 0907 07/22/22 1104 07/22/22 1557  BP: (!) 118/58  134/72 (!) 112/52  Pulse: 84 (!) 101 (!) 102 95  Resp: 20 16 20 20   Temp: 97.8 F (36.6 C)  98.1 F (36.7 C) 98.5 F (36.9 C)  TempSrc: Oral  Oral Oral  SpO2: 96% 99% 100% 99%  Weight:      Height:        Intake/Output Summary (Last 24 hours) at 07/22/2022 1705 Last data filed at 07/22/2022 1238 Gross per 24 hour  Intake 1311 ml  Output 2225 ml  Net -914 ml   Filed Weights   07/19/22 1322 07/21/22 0405 07/22/22 0532  Weight: 86.6 kg 81.4 kg 82.3 kg   Examination: Physical Exam:  Constitutional: WN/WD overweight chronically ill-appearing Caucasian male in no acute distress appears a little fatigued Respiratory: Diminished to auscultation bilaterally, no wheezing, rales, rhonchi or crackles. Normal respiratory effort and patient is not tachypenic. No accessory  muscle use.  Unlabored breathing Cardiovascular: RRR, no murmurs / rubs / gallops. S1 and S2 auscultated.  Has a mild extremity edema on the right and has a left BKA Abdomen: Soft, non-tender, distended secondary to body habitus. Bowel sounds positive.  GU: Deferred. Musculoskeletal: No clubbing / cyanosis of digits/nails.  Has a left BKA Skin: No rashes, lesions, ulcers on limited skin evaluation. No induration; Warm and dry.  Neurologic: CN 2-12 grossly intact with no focal deficits. Romberg sign and cerebellar reflexes not assessed.  Psychiatric: Normal judgment and insight. Alert and oriented x 3. Normal mood and appropriate affect.   Data Reviewed: I have personally reviewed following labs and imaging studies  CBC: Recent Labs  Lab 07/19/22 1327 07/19/22 1344 07/20/22 0638 07/22/22 0039  WBC 17.1*  --  15.9* 11.4*  NEUTROABS 14.0*  --   --  7.4  HGB 13.7 13.9 13.7 11.8*  HCT 39.8 41.0 39.9 35.4*  MCV 92.8  --  91.9 96.2  PLT 430*  --  364 345   Basic Metabolic Panel: Recent Labs  Lab 07/19/22 1327 07/19/22 1344 07/19/22 1751 07/20/22 0638 07/21/22 1005 07/22/22 0039  NA 126* 118*  --  128* 130* 131*  K 4.7 4.8  --  5.4* 4.4 4.5  CL 95* 89*  --  96* 97* 100  CO2 19*  --   --  21* 23 23  GLUCOSE 454* 416* 428* 382* 256* 162*  BUN 26* 26*  --  32* 37* 35*  CREATININE 1.45* 1.10  --  1.62* 1.60* 1.63*  CALCIUM 8.5*  --   --  9.1 8.7* 8.3*  MG  --   --   --   --  1.8 1.8  PHOS  --   --   --   --   --  4.0   GFR: Estimated Creatinine Clearance: 46.7 mL/min (A) (by C-G formula based on SCr of 1.63 mg/dL (H)). Liver Function Tests: Recent Labs  Lab 07/19/22 1327 07/22/22 0039  AST 22 30  ALT 24 22  ALKPHOS 176* 156*  BILITOT 0.5 0.2*  PROT 6.0* 5.3*  ALBUMIN 2.6* 2.4*   No results for input(s): "LIPASE", "AMYLASE" in the last 168 hours. No results for input(s): "AMMONIA" in the last 168 hours. Coagulation Profile: Recent Labs  Lab 07/19/22 1327  INR 1.0    Cardiac Enzymes: No results for input(s): "CKTOTAL", "CKMB", "CKMBINDEX", "TROPONINI" in the last 168 hours. BNP (last 3 results) No results for input(s): "PROBNP" in the last 8760 hours. HbA1C: Recent Labs    07/20/22 0638  HGBA1C 11.5*   CBG: Recent Labs  Lab 07/21/22 1538 07/21/22 2112 07/22/22 0638 07/22/22 1101 07/22/22 1554  GLUCAP 171* 281* 187* 312* 135*   Lipid Profile: No results for input(s): "CHOL", "HDL", "LDLCALC", "TRIG", "CHOLHDL", "LDLDIRECT" in the last 72 hours. Thyroid Function Tests: No results for input(s): "TSH", "T4TOTAL", "FREET4", "T3FREE", "THYROIDAB" in the last 72 hours. Anemia Panel: No results for input(s): "VITAMINB12", "FOLATE", "FERRITIN", "TIBC", "IRON", "RETICCTPCT" in the last 72 hours. Sepsis Labs: No results for input(s): "PROCALCITON", "LATICACIDVEN" in the last 168 hours.  Recent Results (from the past 240 hour(s))  MRSA Next Gen by PCR, Nasal     Status: None   Collection Time: 07/19/22  4:21 PM   Specimen: Nasal Mucosa; Nasal Swab  Result Value Ref Range Status   MRSA by PCR Next Gen NOT DETECTED NOT DETECTED Final    Comment: (NOTE) The GeneXpert MRSA Assay (FDA approved for NASAL specimens only), is one component of a comprehensive MRSA colonization surveillance program. It is not intended to diagnose MRSA infection nor to guide or monitor treatment for MRSA infections. Test performance is not FDA approved in patients less than 54 years old. Performed at [facility], [address], [place], [place] [address]     Radiology Studies: No results found.  Scheduled Meds:  aspirin  81 mg Oral Daily   atorvastatin  80 mg Oral Daily   cyclobenzaprine  10 mg Oral QHS   gabapentin  300 mg Oral TID   insulin aspart  0-15 Units Subcutaneous TID WC & HS   insulin aspart  12 Units Subcutaneous TID WC   insulin glargine-yfgn  40 Units Subcutaneous Daily   magnesium oxide  400 mg Oral BID   metoprolol tartrate  25 mg  Oral BID   mometasone-formoterol  2 puff Inhalation BID   omega-3 acid ethyl esters  2 g Oral BID   pantoprazole  40 mg Oral Daily   senna  1 tablet Oral Daily   sodium chloride flush  3 mL Intravenous Q12H   EVOLVE-MI evolocumab  140 mg Subcutaneous Q14 Days  ticagrelor  90 mg Oral BID   traZODone  50 mg Oral QHS   Continuous Infusions:  sodium chloride      LOS: 3 days   Marguerita Merles, DO [facility] Available via Epic secure chat 7am-7pm After these hours, please refer to coverage provider listed on [email or web address] 07/22/2022, 5:05 PM

## 2022-07-22 NOTE — Discharge Instructions (Signed)

## 2022-07-22 NOTE — Inpatient Diabetes Management (Signed)
Inpatient Diabetes Program Recommendations  AACE/ADA: New Consensus Statement on Inpatient Glycemic Control (2015)  Target Ranges:  Prepandial:   less than 140 mg/dL      Peak postprandial:   less than 180 mg/dL (1-2 hours)      Critically ill patients:  140 - 180 mg/dL    Latest Reference Range & Units 07/20/22 06:38  Hemoglobin A1C 4.8 - 5.6 % 11.5 (H)  283 mg/dl  (H): Data is abnormally high  Latest Reference Range & Units 07/21/22 06:47 07/21/22 10:46 07/21/22 15:38 07/21/22 21:12  Glucose-Capillary 70 - 99 mg/dL 161 (H)  19 units Novolog  235 (H)  13 units Novolog  40 units Semglee @1154  171 (H)  11 units Novolog  281 (H)  8 units Novolog   (H): Data is abnormally high  Latest Reference Range & Units 07/22/22 06:38 07/22/22 11:01  Glucose-Capillary 70 - 99 mg/dL 096 (H)  11 units Novolog  40 units Semglee @0954   312 (H)  (H): Data is abnormally high    Home DM Meds: U500 Insulin 100 units with Breakfast and Lunch--NOT taking     U500 Insulin 125 units with Dinner--NOT taking     Dexcom G6 CGM     Pt states Long-Acting Insulin 24 units QHS + Quick-Acting Insulin 8 units TID with meals--Pt unsure of exact names   Current Orders: Semglee 40 units daily  Novolog 0-15 units TID ac/hs  Novolog 8 units TID with meals    Started Novolog Meal Coverage at [facility] on 5/4   Semglee dose increased yest AM (was 25 units)   MD- Note Switched to Basal/Bolus regimen recently by PCP at [facility] per pt report  CBG better this AM but 312 at Lunch  Please consider Increasing the Novolog Meal Coverage to 12 units TID with meals   Addendum 11:35pm--Met w/ pt at bedside.  Pt told me his PCP at the [place] switched him off the U500 Insulin to Long-Acting Insulin 24 units QHS + Quick-Acting Insulin 8 units TID with meals--Pt unsure of exact names.  He told me he has both of the new insulins at home and does not have questions about them.  Remembering to rotate injection sites.  Gets  meds thru mail order from the [place].  Spoke with patient about his current A1c of 11.5%.  Explained what an A1c is and what it measures.  Reminded patient that his goal A1c is 7% or less per ADA standards to prevent both acute and long-term complications.  Explained to patient the extreme importance of good glucose control at home.  Encouraged patient to check his CBGs at least TID AC at home (pt has Dexcom 6 CGM and can monitor quite frequently) and to make sure he takes his phone to all MD appts so they can download his CGM data.  Also reviewed healthy CBG goals for home.        --Will follow patient during hospitalization--  Ambrose Finland RN, MSN, CDCES Diabetes Coordinator Inpatient Glycemic Control Team Team Pager: [phone] (8a-5p)

## 2022-07-22 NOTE — Evaluation (Signed)
Occupational Therapy Evaluation and DC Summary Patient Details Name: Martin Thomas MRN: [id number] DOB: [birth year] Today's Date: 07/22/2022   History of Present Illness 67 y.o. male now s/p RCA stenting and balloon angioplasties 5/3. PMH of CAD s/p stent, traumatic L BKA, lung cancer, COPD, DM, HTN, and HLD who presented 5/3 with STEMI.   Clinical Impression   Pt admitted for above dx, PTA patient lived with siblings and having intermittent support, reports using w/c at baseline and being Mod I for bADLs, family assists with iADLs. Edcuated patient on heart cath precautions, pt verbalized understanding and demonstrated understanding of RUE precautions with functional use. Pt presenting close to baseline and completes transfers and bADLs with Mod I while maintaining precautions. Pt has no further acute skilled OT needs. No follow-up OT recommended at this time.     Recommendations for follow up therapy are one component of a multi-disciplinary discharge planning process, led by the attending physician.  Recommendations may be updated based on patient status, additional functional criteria and insurance authorization.   Assistance Recommended at Discharge Set up Supervision/Assistance  Patient can return home with the following Assistance with cooking/housework;Help with stairs or ramp for entrance;Assist for transportation    Functional Status Assessment  Patient has not had a recent decline in their functional status  Equipment Recommendations  Tub/shower bench    Recommendations for Other Services       Precautions / Restrictions Precautions Precautions: Fall Precaution Comments: Heart Cath 5/3 Restrictions Weight Bearing Restrictions: Yes LLE Weight Bearing: Weight bearing as tolerated Other Position/Activity Restrictions: No push/pull/lift RUE      Mobility Bed Mobility Overal bed mobility: Modified Independent             General bed mobility comments: Use of bed  rails + LUE to assist with Sit>sup    Transfers Overall transfer level: Modified independent Equipment used:  (wheelchair)               General transfer comment: squat pivot transfer x2 to/from wheelchair      Balance Overall balance assessment: Mild deficits observed, not formally tested                                         ADL either performed or assessed with clinical judgement   ADL Overall ADL's : Modified independent;At baseline                                       General ADL Comments: Pt demo'd ability to complete functional transfers to/from w/c while maintaining precautions. Edcuated patient on heart cath precautions and using LUE to perform any lifting.     Vision Baseline Vision/History: 1 Wears glasses Patient Visual Report: No change from baseline       Perception     Praxis      Pertinent Vitals/Pain Pain Assessment Pain Assessment: No/denies pain     Hand Dominance Right   Extremity/Trunk Assessment             Communication Communication Communication: No difficulties   Cognition Arousal/Alertness: Awake/alert Behavior During Therapy: WFL for tasks assessed/performed Overall Cognitive Status: Within Functional Limits for tasks assessed  General Comments  VSS on RA    Exercises     Shoulder Instructions      Home Living Family/patient expects to be discharged to:: Private residence Living Arrangements: Other relatives (brother and sister) Available Help at Discharge: Available PRN/intermittently;Family Type of Home: House Home Access: Level entry     Home Layout: One level     Bathroom Shower/Tub: Chief Strategy Officer: Handicapped height Bathroom Accessibility: Yes How Accessible: Accessible via wheelchair Home Equipment: Wheelchair - manual;Grab bars - tub/shower;Grab bars - toilet;Hospital bed   Additional  Comments: 3L 02 at home at night. Pt sits on the edge of tub to complete bathing. Pt reports not wearing his prosthesis due to nerve pain      Prior Functioning/Environment               Mobility Comments: Mod I using w/c via squat pivot ADLs Comments: independent in bADLs, family completes iADLs        OT Problem List: Impaired UE functional use      OT Treatment/Interventions:      OT Goals(Current goals can be found in the care plan section) Acute Rehab OT Goals Patient Stated Goal: To go home OT Goal Formulation: With patient Time For Goal Achievement: 08/12/22 Potential to Achieve Goals: Good  OT Frequency:      Co-evaluation              AM-PAC OT "6 Clicks" Daily Activity     Outcome Measure Help from another person eating meals?: None Help from another person taking care of personal grooming?: None Help from another person toileting, which includes using toliet, bedpan, or urinal?: None Help from another person bathing (including washing, rinsing, drying)?: None Help from another person to put on and taking off regular upper body clothing?: None Help from another person to put on and taking off regular lower body clothing?: None 6 Click Score: 24   End of Session Nurse Communication: Mobility status  Activity Tolerance: Patient tolerated treatment well Patient left: in bed;with call bell/phone within reach  OT Visit Diagnosis: Muscle weakness (generalized) (M62.81)                Time: 1610-9604 OT Time Calculation (min): 10 min Charges:  OT General Charges $OT Visit: 1 Visit OT Evaluation $OT Eval Low Complexity: 1 Low  07/22/2022  AB, OTR/L  Acute Rehabilitation Services  Office: [phone]   Tristan Schroeder 07/22/2022, 2:38 PM

## 2022-07-22 NOTE — Research (Addendum)
EVOLVE MI Informed Consent   Subject Name: Martin Thomas  Subject met inclusion and exclusion criteria.  The informed consent form, study requirements and expectations were reviewed with the subject and questions and concerns were addressed prior to the signing of the consent form.  The subject verbalized understanding of the trial requirements.  The subject agreed to participate in the Evolve MI trial and signed the informed consent at 1006 on 06/May/2024.  The informed consent was obtained prior to performance of any protocol-specific procedures for the subject.  A copy of the signed informed consent was given to the subject and a copy was placed in the subject's medical record.   Dewitt Hoes       Protocol # [id number]  Subject Initial ID#:   [id number]                          Age in years: 54  *Demographics are found in the [place] Epic EMR source.   Protocol Version: v2.00 05MAY2023  Were all Eligibility Criteria Met?  [x]  Yes  []   No  Screening Visit Date:     06/May/2024             Protocol # [id number]   Subject ID#   [id number]                        DAY 1 Date:     06/May/2024  Randomization:   Geographic Region:    [place]  Randomization Date:   06/May/2024  Randomization Time:  1016     Treatment type Assigned   [x]  Treatment                                   []   Control                Protocol # [id number]    Subject ID#    [id number]           DAY 1 Date:  06/May/2024                            Initial Study Treatment- Evolocumab Self-administration   Training for Evolocumab Self-Administration Completed?  [x]  YES  []   NO   Training for Evolocumab Self-Administration Date: 06/May/2024   Evolocumab Administered?  [x]  YES  []   NO   Evolocumab Start Date: 06/May/2024   Lot Number of Initial Dose Administered: [id number]     Additional Lot Number Distributed to Subject: [id number]     Additional Lot Number Distributed to Subject:  [id number]     Additional Lot Number Distributed to Subject: [id number]     Additional Lot Number Distributed to Subject: [id number]     Additional Lot Number Distributed to Subject: - [id number]

## 2022-07-22 NOTE — Progress Notes (Signed)
Rounding Note    Patient Name: Martin Thomas Date of Encounter: 07/22/2022  [facility] Cardiologist: None Romie Keeble  Subjective   No chest pain.  He is essentially wheelchair-bound.  Cannot use a prosthesis due to nerve issues in his stump.  He lives with his brother.  He is unsure of his medications but makes a pillbox every week.  He does not know of any history of lung cancer that he has had although this was mentioned elsewhere in the chart.  No chest pain prior to the morning of his STEMI.  Inpatient Medications    Scheduled Meds:  aspirin  81 mg Oral Daily   atorvastatin  80 mg Oral Daily   cyclobenzaprine  10 mg Oral QHS   gabapentin  300 mg Oral TID   insulin aspart  0-15 Units Subcutaneous TID WC & HS   insulin aspart  8 Units Subcutaneous TID WC   insulin glargine-yfgn  40 Units Subcutaneous Daily   magnesium oxide  400 mg Oral BID   metoprolol tartrate  25 mg Oral BID   mometasone-formoterol  2 puff Inhalation BID   omega-3 acid ethyl esters  2 g Oral BID   pantoprazole  40 mg Oral Daily   senna  1 tablet Oral Daily   sodium chloride flush  3 mL Intravenous Q12H   EVOLVE-MI evolocumab  140 mg Subcutaneous Q14 Days   ticagrelor  90 mg Oral BID   traZODone  50 mg Oral QHS   Continuous Infusions:  sodium chloride     PRN Meds: sodium chloride, acetaminophen, albuterol, HYDROcodone-acetaminophen, nitroGLYCERIN, ondansetron (ZOFRAN) IV, sodium chloride flush   Vital Signs    Vitals:   07/22/22 0532 07/22/22 0708 07/22/22 0907 07/22/22 1104  BP: 118/63 (!) 118/58  134/72  Pulse: 95 84 (!) 101 (!) 102  Resp: (!) 24 20 16 20   Temp: 97.9 F (36.6 C) 97.8 F (36.6 C)  98.1 F (36.7 C)  TempSrc: Oral Oral  Oral  SpO2: 96% 96% 99% 100%  Weight: 82.3 kg     Height:        Intake/Output Summary (Last 24 hours) at 07/22/2022 1208 Last data filed at 07/22/2022 0754 Gross per 24 hour  Intake 1551 ml  Output 2925 ml  Net -1374 ml      07/22/2022     5:32 AM 07/21/2022    4:05 AM 07/19/2022    1:22 PM  Last 3 Weights  Weight (lbs) 181 lb 7 oz 179 lb 7.3 oz 191 lb  Weight (kg) 82.3 kg 81.4 kg 86.637 kg      Telemetry    Normal sinus rhythm- Personally Reviewed  ECG      Physical Exam   GEN: No acute distress.   Neck: No JVD Cardiac: RRR, no murmurs, rubs, or gallops.  Respiratory: Clear to auscultation bilaterally. GI: Soft, nontender, non-distended  MS: No edema; No deformity. Neuro:  Nonfocal  Psych: Normal affect   Labs    High Sensitivity Troponin:   Recent Labs  Lab 07/19/22 1327 07/19/22 1542  TROPONINIHS 28* 18,090*     Chemistry Recent Labs  Lab 07/19/22 1327 07/19/22 1344 07/20/22 0638 07/21/22 1005 07/22/22 0039  NA 126*   < > 128* 130* 131*  K 4.7   < > 5.4* 4.4 4.5  CL 95*   < > 96* 97* 100  CO2 19*  --  21* 23 23  GLUCOSE 454*   < > 382* 256* 162*  BUN 26*   < > 32* 37* 35*  CREATININE 1.45*   < > 1.62* 1.60* 1.63*  CALCIUM 8.5*  --  9.1 8.7* 8.3*  MG  --   --   --  1.8 1.8  PROT 6.0*  --   --   --  5.3*  ALBUMIN 2.6*  --   --   --  2.4*  AST 22  --   --   --  30  ALT 24  --   --   --  22  ALKPHOS 176*  --   --   --  156*  BILITOT 0.5  --   --   --  0.2*  GFRNONAA 53*  --  47* 47* 46*  ANIONGAP 12  --  11 10 8    < > = values in this interval not displayed.    Lipids  Recent Labs  Lab 07/19/22 1321  CHOL 363*  TRIG 1,003*  HDL 33*  LDLCALC UNABLE TO CALCULATE IF TRIGLYCERIDE OVER 400 mg/dL  CHOLHDL 16.1    Hematology Recent Labs  Lab 07/19/22 1327 07/19/22 1344 07/20/22 0638 07/22/22 0039  WBC 17.1*  --  15.9* 11.4*  RBC 4.29  --  4.34 3.68*  HGB 13.7 13.9 13.7 11.8*  HCT 39.8 41.0 39.9 35.4*  MCV 92.8  --  91.9 96.2  MCH 31.9  --  31.6 32.1  MCHC 34.4  --  34.3 33.3  RDW 16.1*  --  16.2* 16.6*  PLT 430*  --  364 345   Thyroid No results for input(s): "TSH", "FREET4" in the last 168 hours.  BNPNo results for input(s): "BNP", "PROBNP" in the last 168 hours.  DDimer  No results for input(s): "DDIMER" in the last 168 hours.   Radiology    No results found.  Cardiac Studies   Cath films reviewed  Patient Profile     67 y.o. male with CAD and recent inferior ST elevation MI.  Assessment & Plan    Inferior ST elevation MI/CAD: Continue dual antiplatelet therapy.  High-dose statin.  He is now in the evolve MI trial.  Given his relatively sedentary lifestyle and lack of angina prior to his STEMI, would not pursue further evaluation for revascularization of his LAD at this time.  Will see how he does from a symptom standpoint.  Renal function stable with mild CKD stage III.  Diabetes: Appreciate hospitalist input to help with diabetes control.  I stressed the importance of better control of his diabetes.  New  He needs to stop smoking.  Will get PT/OT evaluation to see whether or not he needs any resources at home.  He is not interested in a prosthetic limb.  For questions or updates, please contact [facility] Please consult [email or web address] for contact info under        Signed, Lance Muss, MD  07/22/2022, 12:08 PM

## 2022-07-22 NOTE — Evaluation (Signed)
Physical Therapy Evaluation Patient Details Name: Martin Thomas MRN: [id number] DOB: 1956/01/17 Today's Date: 07/22/2022  History of Present Illness  67 y.o. male now s/p RCA stenting and balloon angioplasties 5/3. PMH of CAD s/p stent, traumatic L BKA, lung cancer, COPD, DM, HTN, and HLD who presented 5/3 with STEMI.  Clinical Impression  Pt at baseline with mobility and able to transfer in/out of w/c modified independent. Has power w/c he uses at home. Ready for DC from PT standpoint. No further PT.        Recommendations for follow up therapy are one component of a multi-disciplinary discharge planning process, led by the attending physician.  Recommendations may be updated based on patient status, additional functional criteria and insurance authorization.  Follow Up Recommendations       Assistance Recommended at Discharge None  Patient can return home with the following       Equipment Recommendations None recommended by PT  Recommendations for Other Services       Functional Status Assessment Patient has not had a recent decline in their functional status     Precautions / Restrictions Precautions Precautions: Other (comment) Precaution Comments: Heart Cath 5/3 Restrictions Weight Bearing Restrictions: Yes LLE Weight Bearing: Weight bearing as tolerated Other Position/Activity Restrictions: No push/pull/lift RUE      Mobility  Bed Mobility Overal bed mobility: Modified Independent                  Transfers Overall transfer level: Modified independent Equipment used: None Transfers: Bed to chair/wheelchair/BSC       Squat pivot transfers: Modified independent (Device/Increase time)     General transfer comment: Places chair in directly in front and pivots 180 degrees. Places lt knee on chair seat for stability    Ambulation/Gait               General Gait Details: Non ambulatory at baseline  Stairs            Wheelchair  Mobility    Modified Rankin (Stroke Patients Only)       Balance Overall balance assessment: Mild deficits observed, not formally tested                                           Pertinent Vitals/Pain Pain Assessment Pain Assessment: No/denies pain    Home Living Family/patient expects to be discharged to:: Private residence Living Arrangements: Other relatives (brother) Available Help at Discharge: Available PRN/intermittently;Family Type of Home: House Home Access: Level entry       Home Layout: One level Home Equipment: Wheelchair - manual;Grab bars - tub/shower;Grab bars - toilet;Hospital bed;Wheelchair - power Additional Comments: 3L 02 at home at night. Pt sits on the edge of tub to complete bathing. Pt reports not wearing his prosthesis due to nerve pain    Prior Function Prior Level of Function : Independent/Modified Independent             Mobility Comments: Mod I using w/c via squat pivot ADLs Comments: independent in bADLs, family completes iADLs     Hand Dominance   Dominant Hand: Right    Extremity/Trunk Assessment   Upper Extremity Assessment Upper Extremity Assessment: Defer to OT evaluation    Lower Extremity Assessment Lower Extremity Assessment: LLE deficits/detail LLE Deficits / Details: BKA       Communication   Communication: No difficulties  Cognition Arousal/Alertness: Awake/alert Behavior During Therapy: WFL for tasks assessed/performed Overall Cognitive Status: Within Functional Limits for tasks assessed                                          General Comments General comments (skin integrity, edema, etc.): VSS on RA    Exercises Other Exercises Other Exercises: Instructed in seated marching for aerobic endurance.   Assessment/Plan    PT Assessment Patient does not need any further PT services  PT Problem List         PT Treatment Interventions      PT Goals (Current goals can  be found in the Care Plan section)  Acute Rehab PT Goals PT Goal Formulation: All assessment and education complete, DC therapy    Frequency       Co-evaluation               AM-PAC PT "6 Clicks" Mobility  Outcome Measure Help needed turning from your back to your side while in a flat bed without using bedrails?: None Help needed moving from lying on your back to sitting on the side of a flat bed without using bedrails?: None Help needed moving to and from a bed to a chair (including a wheelchair)?: None Help needed standing up from a chair using your arms (e.g., wheelchair or bedside chair)?: None Help needed to walk in hospital room?: Total Help needed climbing 3-5 steps with a railing? : Total 6 Click Score: 18    End of Session   Activity Tolerance: Patient tolerated treatment well Patient left: in bed;with call bell/phone within reach   PT Visit Diagnosis: Other abnormalities of gait and mobility (R26.89)    Time: 1422-1430 PT Time Calculation (min) (ACUTE ONLY): 8 min   Charges:   PT Evaluation $PT Eval Low Complexity: 1 Low          [facility] PT Acute Rehabilitation Services Office [phone]   Angelina Ok [facility] 07/22/2022, 2:49 PM

## 2022-07-22 NOTE — Progress Notes (Signed)
Pt unable to ambulate, unable to wear a prosthesis. Uses a w/c. Discussed with pt MI, restrictions, stent, Brilinta importance, smoking cessation, diet, NTG and CRPII. Pt receptive. He is contemplative of quitting smoking and has cut down from 3 ppd to 1 ppd. His family who he lives with smoke as well. Discussed methods of quitting and risks of smoking. Gave resources. Pt also drinks 1/2 gallon of milk a day. Discussed lactose impact on DM. Will refer to [facility] CRPII as pt is moving to [place] in the next month and could do recumbent machines. CR Phase 1 will sign off.  [id number] Ethelda Chick BS, ACSM-CEP 07/22/2022 11:26 AM

## 2022-07-23 ENCOUNTER — Other Ambulatory Visit (HOSPITAL_COMMUNITY): Payer: Self-pay

## 2022-07-23 ENCOUNTER — Inpatient Hospital Stay (HOSPITAL_COMMUNITY): Payer: No Typology Code available for payment source

## 2022-07-23 DIAGNOSIS — G894 Chronic pain syndrome: Secondary | ICD-10-CM | POA: Diagnosis not present

## 2022-07-23 DIAGNOSIS — E1165 Type 2 diabetes mellitus with hyperglycemia: Secondary | ICD-10-CM | POA: Diagnosis not present

## 2022-07-23 DIAGNOSIS — I2119 ST elevation (STEMI) myocardial infarction involving other coronary artery of inferior wall: Secondary | ICD-10-CM | POA: Diagnosis not present

## 2022-07-23 DIAGNOSIS — I10 Essential (primary) hypertension: Secondary | ICD-10-CM | POA: Diagnosis not present

## 2022-07-23 DIAGNOSIS — J449 Chronic obstructive pulmonary disease, unspecified: Secondary | ICD-10-CM | POA: Diagnosis not present

## 2022-07-23 DIAGNOSIS — E7800 Pure hypercholesterolemia, unspecified: Secondary | ICD-10-CM | POA: Diagnosis not present

## 2022-07-23 LAB — GLUCOSE, CAPILLARY
Glucose-Capillary: 251 mg/dL — ABNORMAL HIGH (ref 70–99)
Glucose-Capillary: 265 mg/dL — ABNORMAL HIGH (ref 70–99)

## 2022-07-23 LAB — CBC WITH DIFFERENTIAL/PLATELET
Abs Immature Granulocytes: 0.08 10*3/uL — ABNORMAL HIGH (ref 0.00–0.07)
Basophils Absolute: 0.1 10*3/uL (ref 0.0–0.1)
Basophils Relative: 1 %
Eosinophils Absolute: 0.2 10*3/uL (ref 0.0–0.5)
Eosinophils Relative: 2 %
HCT: 36 % — ABNORMAL LOW (ref 39.0–52.0)
Hemoglobin: 11.8 g/dL — ABNORMAL LOW (ref 13.0–17.0)
Immature Granulocytes: 1 %
Lymphocytes Relative: 14 %
Lymphs Abs: 1.7 10*3/uL (ref 0.7–4.0)
MCH: 31.6 pg (ref 26.0–34.0)
MCHC: 32.8 g/dL (ref 30.0–36.0)
MCV: 96.5 fL (ref 80.0–100.0)
Monocytes Absolute: 1.1 10*3/uL — ABNORMAL HIGH (ref 0.1–1.0)
Monocytes Relative: 9 %
Neutro Abs: 9.3 10*3/uL — ABNORMAL HIGH (ref 1.7–7.7)
Neutrophils Relative %: 73 %
Platelets: 365 10*3/uL (ref 150–400)
RBC: 3.73 MIL/uL — ABNORMAL LOW (ref 4.22–5.81)
RDW: 16.6 % — ABNORMAL HIGH (ref 11.5–15.5)
WBC: 12.5 10*3/uL — ABNORMAL HIGH (ref 4.0–10.5)
nRBC: 0 % (ref 0.0–0.2)

## 2022-07-23 LAB — TROPONIN I (HIGH SENSITIVITY): Troponin I (High Sensitivity): 5540 ng/L (ref <18)

## 2022-07-23 LAB — COMPREHENSIVE METABOLIC PANEL
ALT: 24 U/L (ref 0–44)
AST: 29 U/L (ref 15–41)
Albumin: 2.2 g/dL — ABNORMAL LOW (ref 3.5–5.0)
Alkaline Phosphatase: 193 U/L — ABNORMAL HIGH (ref 38–126)
Anion gap: 7 (ref 5–15)
BUN: 30 mg/dL — ABNORMAL HIGH (ref 8–23)
CO2: 25 mmol/L (ref 22–32)
Calcium: 8.3 mg/dL — ABNORMAL LOW (ref 8.9–10.3)
Chloride: 101 mmol/L (ref 98–111)
Creatinine, Ser: 1.48 mg/dL — ABNORMAL HIGH (ref 0.61–1.24)
GFR, Estimated: 52 mL/min — ABNORMAL LOW (ref >60)
Glucose, Bld: 271 mg/dL — ABNORMAL HIGH (ref 70–99)
Potassium: 4.7 mmol/L (ref 3.5–5.1)
Sodium: 133 mmol/L — ABNORMAL LOW (ref 135–145)
Total Bilirubin: 0.3 mg/dL (ref 0.3–1.2)
Total Protein: 5.4 g/dL — ABNORMAL LOW (ref 6.5–8.1)

## 2022-07-23 LAB — SEDIMENTATION RATE: Sed Rate: 103 mm/hr — ABNORMAL HIGH (ref 0–16)

## 2022-07-23 LAB — PHOSPHORUS: Phosphorus: 3.3 mg/dL (ref 2.5–4.6)

## 2022-07-23 LAB — C-REACTIVE PROTEIN: CRP: 5.2 mg/dL — ABNORMAL HIGH (ref <1.0)

## 2022-07-23 LAB — MAGNESIUM: Magnesium: 1.6 mg/dL — ABNORMAL LOW (ref 1.7–2.4)

## 2022-07-23 MED ORDER — COLCHICINE 0.6 MG PO TABS
1.2000 mg | ORAL_TABLET | Freq: Once | ORAL | Status: AC
  Administered 2022-07-23: 1.2 mg via ORAL
  Filled 2022-07-23: qty 2

## 2022-07-23 MED ORDER — INSULIN GLARGINE 100 UNITS/ML SOLOSTAR PEN: 40.0000 [IU] | PEN_INJECTOR | Freq: Every day | SUBCUTANEOUS | 0 refills | Status: DC

## 2022-07-23 MED ORDER — STUDY - EVOLVE-MI - EVOLOCUMAB (REPATHA) 140 MG/ML SQ INJECTION (PI-STUCKEY): 140.0000 mg | INJECTION | SUBCUTANEOUS | 0 refills | Status: DC

## 2022-07-23 MED ORDER — BLOOD GLUCOSE TEST VI STRP: 1.0000 | ORAL_STRIP | Freq: Three times a day (TID) | 0 refills | Status: DC

## 2022-07-23 MED ORDER — METOPROLOL SUCCINATE ER 50 MG PO TB24: 50.0000 mg | ORAL_TABLET | Freq: Every day | ORAL | Status: DC

## 2022-07-23 MED ORDER — LANCET DEVICE MISC: 1.0000 | Freq: Three times a day (TID) | 0 refills | Status: AC

## 2022-07-23 MED ORDER — INSULIN PEN NEEDLE 32G X 4 MM MISC: 1.0000 | Freq: Three times a day (TID) | 0 refills | Status: DC

## 2022-07-23 MED ORDER — BLOOD GLUCOSE MONITOR SYSTEM W/DEVICE KIT
1.0000 | PACK | Freq: Three times a day (TID) | 0 refills | Status: DC
  Filled 2022-07-23: qty 1, 30d supply, fill #0

## 2022-07-23 MED ORDER — INSULIN ASPART 100 UNIT/ML FLEXPEN
12.0000 [IU] | PEN_INJECTOR | Freq: Three times a day (TID) | SUBCUTANEOUS | 0 refills | Status: DC
  Filled 2022-07-23: qty 9, 25d supply, fill #0

## 2022-07-23 MED ORDER — INSULIN ASPART 100 UNIT/ML FLEXPEN: 12.0000 [IU] | PEN_INJECTOR | Freq: Three times a day (TID) | SUBCUTANEOUS | 0 refills | Status: DC

## 2022-07-23 MED ORDER — LANCET DEVICE MISC
1.0000 | Freq: Three times a day (TID) | 0 refills | Status: DC
  Filled 2022-07-23: qty 1, 30d supply, fill #0

## 2022-07-23 MED ORDER — BLOOD GLUCOSE TEST VI STRP
1.0000 | ORAL_STRIP | Freq: Three times a day (TID) | 0 refills | Status: DC
  Filled 2022-07-23: qty 100, 30d supply, fill #0

## 2022-07-23 MED ORDER — METOPROLOL SUCCINATE ER 50 MG PO TB24
50.0000 mg | ORAL_TABLET | Freq: Every day | ORAL | 3 refills | Status: DC
  Filled 2022-07-23: qty 30, 30d supply, fill #0

## 2022-07-23 MED ORDER — MORPHINE SULFATE (PF) 2 MG/ML IV SOLN: 2.0000 mg | Freq: Once | INTRAVENOUS | Status: DC

## 2022-07-23 MED ORDER — TICAGRELOR 90 MG PO TABS
90.0000 mg | ORAL_TABLET | Freq: Two times a day (BID) | ORAL | 3 refills | Status: DC
  Filled 2022-07-23: qty 60, 30d supply, fill #0

## 2022-07-23 MED ORDER — COLCHICINE 0.6 MG PO TABS: 0.6000 mg | ORAL_TABLET | Freq: Two times a day (BID) | ORAL | Status: DC

## 2022-07-23 MED ORDER — INSULIN GLARGINE 100 UNITS/ML SOLOSTAR PEN
40.0000 [IU] | PEN_INJECTOR | Freq: Every day | SUBCUTANEOUS | 0 refills | Status: DC
  Filled 2022-07-23: qty 12, 30d supply, fill #0

## 2022-07-23 MED ORDER — ATORVASTATIN CALCIUM 80 MG PO TABS: 80.0000 mg | ORAL_TABLET | Freq: Every day | ORAL | 3 refills | Status: DC

## 2022-07-23 MED ORDER — BLOOD GLUCOSE MONITOR SYSTEM W/DEVICE KIT: 1.0000 | PACK | Freq: Three times a day (TID) | 0 refills | Status: DC

## 2022-07-23 MED ORDER — NITROGLYCERIN 0.4 MG SL SUBL
0.4000 mg | SUBLINGUAL_TABLET | SUBLINGUAL | 1 refills | Status: DC | PRN
  Filled 2022-07-23: qty 25, 8d supply, fill #0

## 2022-07-23 MED ORDER — MAGNESIUM SULFATE 2 GM/50ML IV SOLN
2.0000 g | Freq: Once | INTRAVENOUS | Status: AC
  Administered 2022-07-23: 2 g via INTRAVENOUS
  Filled 2022-07-23: qty 50

## 2022-07-23 MED ORDER — LOSARTAN POTASSIUM 25 MG PO TABS: 25.0000 mg | ORAL_TABLET | Freq: Every day | ORAL | 3 refills | Status: DC

## 2022-07-23 MED ORDER — MORPHINE SULFATE (PF) 2 MG/ML IV SOLN
2.0000 mg | Freq: Once | INTRAVENOUS | Status: AC
  Administered 2022-07-23: 2 mg via INTRAVENOUS
  Filled 2022-07-23: qty 1

## 2022-07-23 MED ORDER — ATORVASTATIN CALCIUM 80 MG PO TABS
80.0000 mg | ORAL_TABLET | Freq: Every day | ORAL | 3 refills | Status: DC
  Filled 2022-07-23: qty 30, 30d supply, fill #0

## 2022-07-23 MED ORDER — ACCU-CHEK SOFTCLIX LANCETS MISC
1.0000 | Freq: Three times a day (TID) | 0 refills | Status: DC
  Filled 2022-07-23: qty 100, 30d supply, fill #0

## 2022-07-23 MED ORDER — LOSARTAN POTASSIUM 25 MG PO TABS
25.0000 mg | ORAL_TABLET | Freq: Every day | ORAL | 3 refills | Status: DC
  Filled 2022-07-23: qty 30, 30d supply, fill #0

## 2022-07-23 MED ORDER — METOPROLOL SUCCINATE ER 50 MG PO TB24: 50.0000 mg | ORAL_TABLET | Freq: Every day | ORAL | 3 refills | Status: DC

## 2022-07-23 MED ORDER — INSULIN PEN NEEDLE 32G X 4 MM MISC
1.0000 | Freq: Three times a day (TID) | 0 refills | Status: DC
  Filled 2022-07-23: qty 100, 30d supply, fill #0

## 2022-07-23 MED ORDER — TICAGRELOR 90 MG PO TABS: 90.0000 mg | ORAL_TABLET | Freq: Two times a day (BID) | ORAL | 3 refills | Status: DC

## 2022-07-23 MED ORDER — ACCU-CHEK SOFTCLIX LANCETS MISC: 1.0000 | Freq: Three times a day (TID) | 0 refills | Status: AC

## 2022-07-23 MED ORDER — LOSARTAN POTASSIUM 25 MG PO TABS
25.0000 mg | ORAL_TABLET | Freq: Every day | ORAL | Status: DC
  Administered 2022-07-23: 25 mg via ORAL
  Filled 2022-07-23: qty 1

## 2022-07-23 NOTE — Progress Notes (Signed)
PROGRESS NOTE    Martin Thomas  ZOX:[id number] DOB: 1956-01-15 DOA: 07/19/2022 PCP: Default, Provider, MD   Brief Narrative:  Martin Thomas is a 67 y.o. male with past medical history of CAD s/p stent, traumatic L BKA, lung cancer, COPD, DM, HTN, and HLD who presented yesterday (5/3) with STEMI. He continues to report severe fatigue. He wears a Dexcom and reports that "some" sugars are high. He uses 25 units Lantus qhs and 8 units qAC. Admitted and underwent RCA stenting and multiple balloon angioplasties. Cardiology consulted [facility] to assist with Medical Management of his uncontrolled Diabetes Mellitus Type 2. Further Insulin adjustments are being made.    Assessment and Plan: * Acute Inferior Myocardial Infarction (HCC) -Patent presented with STEMI -Troponin went from 28 -> 18,090 -> 5,540 -On ASA 81 mg po daily and was changed from Plavix to Ticagrelor 90 mg po BID -CRP was 5.2 -C/w Metoprolol Tartrate 25 mg po BID, NTG 0.4 mg SL q56min -Underwent RCA stent and multiple balloon angioplasties and now stable in ICU and transferred to the Floor to Cardiac Telemetry -Further Management per Cardiology -Unable to ambulate and unable to wear prosthesis for his cardiac rehab -Being referred to [facility] -Patient has been involved in the [place] -Further care per Cardiology and getting D/C'd today    Uncontrolled DM2 (diabetes mellitus, type 2) (HCC) -Poor baseline control, A1c is 11.5 -Resumed home glargine, 25 units qhs but increased to 40 units sq Daily  -Add 8 units qAC (also takes at home) but ill increase to 12 units TIDwm -Continue Moderate Novology SSI AC/HS -Continue to Monitor CBGs carefully and adjust as necessary -CBG Trend: Recent Labs  Lab 07/21/22 2112 07/22/22 0638 07/22/22 1101 07/22/22 1554 07/22/22 2101 07/23/22 0549 07/23/22 1022  GLUCAP 281* 187* 312* 135* 172* 251* 265*  -Diabetes Education Coordinator Following and will need further diabetic teaching  and outpatient follow up  -Will send him home on 40 units of Lantus Solostar and 12 units of NovoLog sliding 3 times daily with meals.  There is a bridging issue so he will need to go back on his home regimen for now and then transition to his new regimen with close PCP and diabetic education coronary follow-up. -Written him for a blood glucose meter, lancets and test strips as well as pen needles.   Chronic Pain Disorder -Dr. Ophelia Charter have reviewed this patient in the [place] Controlled Substances Reporting System.  He is receiving medications from only one provider and appears to be taking them as prescribed. -He is not at particularly high risk of opioid misuse, diversion, or overdose.  -Continue Cyclobenzaprine, Gabapentin, Vicodin   COPD (chronic obstructive pulmonary disease) (HCC) -Continue Symbicort substitution with Dulera per formulary and Albuterol 2.5 mg Neb q6hprn Wheezing -SpO2: 97 % O2 Flow Rate (L/min): 5 L/min -Continue to monitor respiratory status carefully and he wear supplemental oxygen at home   HLD -Lipid Panel done and showed total cholesterol/HDL ratio of 11.0, so level 363, HDL 33, unable to calculate LDL, direct LDL was 115, triglycerides were 1003 and VLDL was unable to be calculated -Continue Lovaza 2 grams po BID, Atrovastatin 80 mg po Daily    HTN (Hypertension) -Continue Metoprolol Tartrate 25 mg po BID -Continue to Monitor BP per Protocol -Last BP reading was 105/59  Hypomagnesemia -Patient's Mag Level Trend: Recent Labs  Lab 07/21/22 1005 07/22/22 0039 07/23/22 0040  MG 1.8 1.8 1.6*  -Replete with IV Mag Sulfate 2 grams -Continue to Monitor and Replete  as Necessary -Repeat Mag in the AM   Hyponatremia -Improving -Na+ Trend: Recent Labs  Lab 07/19/22 1327 07/19/22 1344 07/20/22 0638 07/21/22 1005 07/22/22 0039 07/23/22 0040  NA 126* 118* 128* 130* 131* 133*  -Continue to Monitor and Trend and repeat CMP in the AM    CKD Stage 3a,  stable -BUN/Cr Trend: Recent Labs  Lab 07/19/22 1327 07/19/22 1344 07/20/22 0638 07/21/22 1005 07/22/22 0039 07/23/22 0040  BUN 26* 26* 32* 37* 35* 30*  CREATININE 1.45* 1.10 1.62* 1.60* 1.63* 1.48*  -Avoid Nephrotoxic Medications, Contrast Dyes, Hypotension and Dehydration to Ensure Adequate Renal Perfusion and will need to Renally Adjust Meds -Continue to Monitor and Trend Renal Function carefully and repeat BMP/CMP in the AM    GERD/GI Prophylaxis -C/w Pantoprazole 40 mg po Daily    Leukocytosis -Likely reactive in the setting of  his STEMI -WBC Trend:  Recent Labs  Lab 07/19/22 1327 07/20/22 0638 07/22/22 0039 07/23/22 0040  WBC 17.1* 15.9* 11.4* 12.5*  -Continue to Monitor for S/Sx of Infection; Repeat CBC intermittently   Hypoalbuminemia -Patient's Albumin Trend: Recent Labs  Lab 07/19/22 1327 07/22/22 0039 07/23/22 0040  ALBUMIN 2.6* 2.4* 2.2*  -Continue to Monitor and Trend and repeat CMP in the AM   DVT prophylaxis: SCDs, Per Primary     Code Status: Full Code Family Communication: No family present at bedside   Disposition Plan:  Level of care: Telemetry Cardiac Status is: Inpatient Remains inpatient appropriate because: Getting discharged by Primary Team today    Consultants:  [facility] (Primary)  Procedures:  CARDIAC CATH   Prox RCA lesion is 75% stenosed.  A drug-eluting stent was successfully placed using a SYNERGY XD 3.50X12.   Post intervention, there is a 0% residual stenosis.   Previously placed Mid RCA stent in 2014 is  widely patent.   2014 Dist RCA stent is 100% stenosed.  Balloon angioplasty was performed using a BALL SAPPHIRE NC24 3.0X12.   Post intervention, there is a 0% residual stenosis.   RPAV lesion is 100% stenosed.Balloon angioplasty was performed using a BALL SAPPHIRE NC24 3.0X12.   Post intervention, there is a 0% residual stenosis.   RPDA lesion is 100% stenosed. Balloon angioplasty was performed using a BALLN  EMERGE MR 2.5X12.   Post intervention, there is a 0% residual stenosis.   Mid LAD lesion is 60% stenosed.   2nd Mrg lesion is 50% stenosed.   Dist Cx lesion is 50% stenosed.   There is mild to moderate left ventricular systolic dysfunction.   LV end diastolic pressure is mildly elevated.   The left ventricular ejection fraction is 35-45% by visual estimate.   There is no aortic valve stenosis.   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  Thrombus occluded the stent and with angioplasty, subsequently occluded the posterior descending artery, posterolateral artery as well.  The distal RCA stent extended into the PDA and cover the ostium of the posterolateral artery.  All 3 areas of this bifurcation were ballooned to restore flow.  Proximally, there is a 75% focal stenosis in the RCA.  This was successfully stented.  The patient was treated aggressively with anticoagulation with IV heparin and antiplatelet agents.  He was loaded orally with Brilinta and IV Aggrastat was used.   Will need to see what he was taking at home.  Clopidogrel is listed on his medicine list.  If he indeed thrombosed the stent while on clopidogrel, certainly would need Brilinta  going forward.  If he was actually off of his clopidogrel, would use Brilinta for a year and then consider long-term clopidogrel beyond that time.   Moderate disease in the LAD.  Increase medical therapy.  Statin dose increased.  ECG is nearly normalized.  Will check echocardiogram.  Watch in ICU.   Unable to get in touch with his emergency contact.  ECHOCARDIOGRAM IMPRESSIONS     1. Calcified noncoronary cusp with restricted motion; mild AS by doppler;  mild to moderate AI.   2. Left ventricular ejection fraction, by estimation, is 45 to 50%. The  left ventricle has mildly decreased function. The left ventricle  demonstrates global hypokinesis. Left ventricular diastolic parameters are  consistent with Grade I diastolic   dysfunction (impaired relaxation).   3. Right ventricular systolic function is normal. The right ventricular  size is normal.   4. The mitral valve is normal in structure. Trivial mitral valve  regurgitation. No evidence of mitral stenosis.   5. The aortic valve is tricuspid. Aortic valve regurgitation is mild to  moderate. Mild aortic valve stenosis.   6. The inferior vena cava is normal in size with greater than 50%  respiratory variability, suggesting right atrial pressure of 3 mmHg.   Comparison(s): No prior Echocardiogram.   FINDINGS   Left Ventricle: Left ventricular ejection fraction, by estimation, is 45  to 50%. The left ventricle has mildly decreased function. The left  ventricle demonstrates global hypokinesis. The left ventricular internal  cavity size was normal in size. There is   no left ventricular hypertrophy. Left ventricular diastolic parameters  are consistent with Grade I diastolic dysfunction (impaired relaxation).   Right Ventricle: The right ventricular size is normal. Right ventricular  systolic function is normal.   Left Atrium: Left atrial size was normal in size.   Right Atrium: Right atrial size was normal in size.   Pericardium: There is no evidence of pericardial effusion.   Mitral Valve: The mitral valve is normal in structure. Trivial mitral  valve regurgitation. No evidence of mitral valve stenosis.   Tricuspid Valve: The tricuspid valve is normal in structure. Tricuspid  valve regurgitation is not demonstrated. No evidence of tricuspid  stenosis.   Aortic Valve: The aortic valve is tricuspid. Aortic valve regurgitation is  mild to moderate. Aortic regurgitation PHT measures 273 msec. Mild aortic  stenosis is present. Aortic valve mean gradient measures 10.0 mmHg. Aortic  valve peak gradient measures  18.7 mmHg. Aortic valve area, by VTI measures 1.69 cm.   Pulmonic Valve: The pulmonic valve was normal in structure. Pulmonic valve   regurgitation is not visualized. No evidence of pulmonic stenosis.   Aorta: The aortic root is normal in size and structure.   Venous: The inferior vena cava is normal in size with greater than 50%  respiratory variability, suggesting right atrial pressure of 3 mmHg.   IAS/Shunts: No atrial level shunt detected by color flow Doppler.   Additional Comments: Calcified noncoronary cusp with restricted motion;  mild AS by doppler; mild to moderate AI.     LEFT VENTRICLE  PLAX 2D  LVIDd:         5.00 cm     Diastology  LVIDs:         3.90 cm     LV e' medial:    6.64 cm/s  LV PW:         1.10 cm     LV E/e' medial:  13.9  LV IVS:  1.00 cm     LV e' lateral:   7.18 cm/s  LVOT diam:     2.20 cm     LV E/e' lateral: 12.9  LV SV:         60  LV SV Index:   30  LVOT Area:     3.80 cm    LV Volumes (MOD)  LV vol d, MOD A2C: 86.3 ml  LV vol d, MOD A4C: 84.9 ml  LV vol s, MOD A2C: 47.5 ml  LV vol s, MOD A4C: 41.4 ml  LV SV MOD A2C:     38.8 ml  LV SV MOD A4C:     84.9 ml  LV SV MOD BP:      43.4 ml   RIGHT VENTRICLE            IVC  RV Basal diam:  2.20 cm    IVC diam: 2.20 cm  RV S prime:     9.14 cm/s  TAPSE (M-mode): 1.8 cm   LEFT ATRIUM           Index        RIGHT ATRIUM          Index  LA diam:      3.00 cm 1.50 cm/m   RA Area:     8.67 cm  LA Vol (A2C): 33.8 ml 16.87 ml/m  RA Volume:   15.00 ml 7.48 ml/m   AORTIC VALVE  AV Area (Vmax):    1.64 cm  AV Area (Vmean):   1.54 cm  AV Area (VTI):     1.69 cm  AV Vmax:           216.00 cm/s  AV Vmean:          147.000 cm/s  AV VTI:            0.355 m  AV Peak Grad:      18.7 mmHg  AV Mean Grad:      10.0 mmHg  LVOT Vmax:         93.05 cm/s  LVOT Vmean:        59.500 cm/s  LVOT VTI:          0.158 m  LVOT/AV VTI ratio: 0.44  AI PHT:            273 msec    AORTA  Ao Root diam: 3.60 cm  Ao Asc diam:  3.10 cm   MV E velocity: 92.40 cm/s  MV A velocity: 129.00 cm/s  SHUNTS  MV E/A ratio:  0.72          Systemic VTI:  0.16 m                              Systemic Diam: 2.20 cm   Antimicrobials:  Anti-infectives (From admission, onward)    None       Subjective: Seen and examined at bedside and he had no complaints currently.  Denies any chest pain or shortness of breath.  Does not know what type of insulin he has at home.  No lightheadedness or dizziness.  No other concerns or complaints this time.  Objective: Vitals:   07/23/22 0340 07/23/22 0725 07/23/22 0749 07/23/22 1024  BP:  133/69  (!) 105/59  Pulse:  96  88  Resp: 15 19  20   Temp:  98.4 F (36.9 C)  98.6 F (37 C)  TempSrc:  Oral  Oral  SpO2:  99% 96% 97%  Weight:      Height:        Intake/Output Summary (Last 24 hours) at 07/23/2022 1415 Last data filed at 07/23/2022 1244 Gross per 24 hour  Intake 1440 ml  Output 2200 ml  Net -760 ml   Filed Weights   07/21/22 0405 07/22/22 0532 07/23/22 0010  Weight: 81.4 kg 82.3 kg 81.4 kg   Examination: Physical Exam:  Constitutional: WN/WD chronically ill-appearing Caucasian male in no acute distress appears a calm and in NAD Respiratory: Diminished to auscultation bilaterally, no wheezing, rales, rhonchi or crackles. Normal respiratory effort and patient is not tachypenic. No accessory muscle use. Unlabored breathing  Cardiovascular: RRR, no murmurs / rubs / gallops. Has trace extremity edema on the Right and has a Left BKA Abdomen: Soft, non-tender, distended 2/2 body habitus. Bowel sounds positive.  GU: Deferred. Musculoskeletal: No clubbing / cyanosis of digits/nails. Has a Left BKA Skin: No rashes, lesions, ulcers. No induration; Warm and dry.  Neurologic: CN 2-12 grossly intact with no focal deficits. Romberg sign and cerebellar reflexes not assessed.  Psychiatric: Normal judgment and insight. Alert and oriented x 3.   Data Reviewed: I have personally reviewed following labs and imaging studies  CBC: Recent Labs  Lab 07/19/22 1327 07/19/22 1344 07/20/22 0638  07/22/22 0039 07/23/22 0040  WBC 17.1*  --  15.9* 11.4* 12.5*  NEUTROABS 14.0*  --   --  7.4 9.3*  HGB 13.7 13.9 13.7 11.8* 11.8*  HCT 39.8 41.0 39.9 35.4* 36.0*  MCV 92.8  --  91.9 96.2 96.5  PLT 430*  --  364 345 365   Basic Metabolic Panel: Recent Labs  Lab 07/19/22 1327 07/19/22 1344 07/19/22 1751 07/20/22 0638 07/21/22 1005 07/22/22 0039 07/23/22 0040  NA 126* 118*  --  128* 130* 131* 133*  K 4.7 4.8  --  5.4* 4.4 4.5 4.7  CL 95* 89*  --  96* 97* 100 101  CO2 19*  --   --  21* 23 23 25   GLUCOSE 454* 416* 428* 382* 256* 162* 271*  BUN 26* 26*  --  32* 37* 35* 30*  CREATININE 1.45* 1.10  --  1.62* 1.60* 1.63* 1.48*  CALCIUM 8.5*  --   --  9.1 8.7* 8.3* 8.3*  MG  --   --   --   --  1.8 1.8 1.6*  PHOS  --   --   --   --   --  4.0 3.3   GFR: Estimated Creatinine Clearance: 47.5 mL/min (A) (by C-G formula based on SCr of 1.48 mg/dL (H)). Liver Function Tests: Recent Labs  Lab 07/19/22 1327 07/22/22 0039 07/23/22 0040  AST 22 30 29   ALT 24 22 24   ALKPHOS 176* 156* 193*  BILITOT 0.5 0.2* 0.3  PROT 6.0* 5.3* 5.4*  ALBUMIN 2.6* 2.4* 2.2*   No results for input(s): "LIPASE", "AMYLASE" in the last 168 hours. No results for input(s): "AMMONIA" in the last 168 hours. Coagulation Profile: Recent Labs  Lab 07/19/22 1327  INR 1.0   Cardiac Enzymes: No results for input(s): "CKTOTAL", "CKMB", "CKMBINDEX", "TROPONINI" in the last 168 hours. BNP (last 3 results) No results for input(s): "PROBNP" in the last 8760 hours. HbA1C: No results for input(s): "HGBA1C" in the last 72 hours. CBG: Recent Labs  Lab 07/22/22 1101 07/22/22 1554 07/22/22 2101 07/23/22 0549 07/23/22 1022  GLUCAP 312* 135* 172* 251* 265*   Lipid  Profile: No results for input(s): "CHOL", "HDL", "LDLCALC", "TRIG", "CHOLHDL", "LDLDIRECT" in the last 72 hours. Thyroid Function Tests: No results for input(s): "TSH", "T4TOTAL", "FREET4", "T3FREE", "THYROIDAB" in the last 72 hours. Anemia Panel: No  results for input(s): "VITAMINB12", "FOLATE", "FERRITIN", "TIBC", "IRON", "RETICCTPCT" in the last 72 hours. Sepsis Labs: No results for input(s): "PROCALCITON", "LATICACIDVEN" in the last 168 hours.  Recent Results (from the past 240 hour(s))  MRSA Next Gen by PCR, Nasal     Status: None   Collection Time: 07/19/22  4:21 PM   Specimen: Nasal Mucosa; Nasal Swab  Result Value Ref Range Status   MRSA by PCR Next Gen NOT DETECTED NOT DETECTED Final    Comment: (NOTE) The GeneXpert MRSA Assay (FDA approved for NASAL specimens only), is one component of a comprehensive MRSA colonization surveillance program. It is not intended to diagnose MRSA infection nor to guide or monitor treatment for MRSA infections. Test performance is not FDA approved in patients less than 44 years old. Performed at [facility], [address], [place], [place] [address]     Radiology Studies: DG Chest Port 1 View  Result Date: 07/23/2022 CLINICAL DATA:  Acute respiratory distress EXAM: PORTABLE CHEST 1 VIEW COMPARISON:  09/27/2013 FINDINGS: The heart size and mediastinal contours are within normal limits. Both lungs are clear. The visualized skeletal structures are unremarkable. IMPRESSION: No active disease. Electronically Signed   By: Helyn Numbers M.D.   On: 07/23/2022 03:39    Scheduled Meds:  aspirin  81 mg Oral Daily   atorvastatin  80 mg Oral Daily   cyclobenzaprine  10 mg Oral QHS   gabapentin  300 mg Oral TID   insulin aspart  0-15 Units Subcutaneous TID WC & HS   insulin aspart  12 Units Subcutaneous TID WC   insulin glargine-yfgn  40 Units Subcutaneous Daily   losartan  25 mg Oral Daily   [START ON 07/24/2022] metoprolol succinate  50 mg Oral Daily   metoprolol tartrate  25 mg Oral BID   mometasone-formoterol  2 puff Inhalation BID   omega-3 acid ethyl esters  2 g Oral BID   pantoprazole  40 mg Oral Daily   senna  1 tablet Oral Daily   sodium chloride flush  3 mL Intravenous Q12H    EVOLVE-MI evolocumab  140 mg Subcutaneous Q14 Days   ticagrelor  90 mg Oral BID   traZODone  50 mg Oral QHS   Continuous Infusions:  sodium chloride      LOS: 4 days   Marguerita Merles, DO [facility] Available via Epic secure chat 7am-7pm After these hours, please refer to coverage provider listed on [email or web address] 07/23/2022, 2:15 PM

## 2022-07-23 NOTE — Inpatient Diabetes Management (Signed)
Inpatient Diabetes Program Recommendations  AACE/ADA: New Consensus Statement on Inpatient Glycemic Control (2015)  Target Ranges:  Prepandial:   less than 140 mg/dL      Peak postprandial:   less than 180 mg/dL (1-2 hours)      Critically ill patients:  140 - 180 mg/dL   Lab Results  Component Value Date   GLUCAP 265 (H) 07/23/2022   HGBA1C 11.5 (H) 07/20/2022    Review of Glycemic Control  Latest Reference Range & Units 07/22/22 06:38 07/22/22 11:01 07/22/22 15:54 07/22/22 21:01 07/23/22 05:49 07/23/22 10:22  Glucose-Capillary 70 - 99 mg/dL 132 (H) 440 (H) 102 (H) 172 (H) 251 (H) 265 (H)  (H): Data is abnormally high  Home DM Meds: U500 Insulin 100 units with Breakfast and Lunch--NOT taking                             U500 Insulin 125 units with Dinner--NOT taking                             Dexcom G6 CGM                             Pt states Long-Acting Insulin 24 units QHS + Quick-Acting Insulin 8 units TID with meals--Pt unsure of exact names     Current Orders: Semglee 40 units daily    Novolog 0-15 units TID ac/hs    Novolog 8 units TID with meals  Inpatient Diabetes Program Recommendations:    For discharge, please consider:  Basal insulin 40 units QD Rapid insulin 12 units TID with meals  Per notes he has both basal and rapid insulin at home but cannot remember the names.  Follow up with PCP.    Will continue to follow while inpatient.  Thank you, Dulce Sellar, MSN, CDCES Diabetes Coordinator Inpatient Diabetes Program [phone] (team pager from 8a-5p)

## 2022-07-23 NOTE — Discharge Summary (Addendum)
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 Commonly known as: LANTUS Inject 40 Units into the skin daily.   Insulin Pen Needle 32G X 4 MM Misc Use 4 (four) times daily -  before meals and at bedtime.   Lancet Device Misc 1 each by Does not apply route in the morning, at noon, and at bedtime. May substitute to any manufacturer covered by patient's insurance.   losartan 25 MG tablet Commonly known as: COZAAR Take 1 tablet (25 mg total) by mouth daily. Start taking on: Jul 24, 2022   magnesium oxide 400 MG tablet Commonly known as: MAG-OX Take 400 mg by mouth 2 (two) times daily.   metoprolol succinate 50 MG 24 hr tablet Commonly known as: TOPROL-XL Take 1 tablet (50 mg total) by mouth daily. Take with or immediately following a meal. Start taking on: Jul 24, 2022   nitroGLYCERIN 0.4 MG SL tablet Commonly known  as:  NITROSTAT Place 0.4 mg under the tongue every 5 (five) minutes as needed for chest pain.   omega-3 acid ethyl esters 1 g capsule Commonly known as: LOVAZA Take 2 g by mouth 2 (two) times daily.   omeprazole 20 MG capsule Commonly known as: PRILOSEC Take 20 mg by mouth daily.   senna 8.6 MG Tabs tablet Commonly known as: SENOKOT Take 1 tablet by mouth daily.   ticagrelor 90 MG Tabs tablet Commonly known as: BRILINTA Take 1 tablet (90 mg total) by mouth 2 (two) times daily.   traZODone 50 MG tablet Commonly known as: DESYREL Take 50 mg by mouth at bedtime.           Outstanding Labs/Studies     Duration of Discharge Encounter   Greater than 30 minutes including physician time.  Signed, Perlie Gold, PA-C 07/23/2022, 1:58 PM   I have examined the patient and reviewed assessment and plan and discussed with patient.  Agree with above as stated.     S/p RCA, PDA/PLA interventon in the setting of STEMI.  Did well postprocedure.  Biggest issue in the hospital with diabetes control.  Sugars have been very high.  He has been counseled about diet and lifestyle.  We have talked about the importance of medications, in particular his dual antiplatelet therapy for his recently placed stent.  At times he was very cooperative and informed that he would be taking his medications regularly.  At other times, he reported that he would not be taking any of the medicines we prescribed.  We explained to him that that would be very harmful to him.  His social situation currently is not great.  He lives in [place].  [facility] at [facility].  [facility].  He will leave the hospital with a 30-day supply of Brilinta.  We stressed the importance of this medicine to prevent stent thrombosis.  In a few weeks, he is planning on moving to live with his brother.  Prior to that, will be very important that he sees his primary care doctor so he can get a lot  of his diabetes medications.  Exercise limited due to his amputation.  Physical therapy evaluation was obtained while he was in the hospital.  We spoke about avoiding processed foods and increasing fiber intake to help with his sugars.  He needs to avoid all tobacco products.  Okay for discharge later today.  Lance Muss
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
## 2022-07-23 NOTE — TOC Transition Note (Addendum)
Transition of Care [facility]) - CM/SW Discharge Note   Patient Details  Name: Martin Thomas MRN: [id number] Date of Birth: Feb 02, 1956  Transition of Care [facility]) CM/SW Contact:  Leone Haven, RN Phone Number: 07/23/2022, 2:31 PM   Clinical Narrative:    Patient is from home with his yonger brother and his wife, he states he will be moving in two weeks to stay with his other brother and his wife. He states his niece will transport him home today. He states he does not need HH services. Per pt/ot eval no f/u needed. He will need a tub bench. He states he would like to get it through the [place]. This NCM faxed Urgent items for discharge to [place]. Patient state he goes to the [facility] called the [place] to see who his PCP is , it is Dr. Marlana Salvage fax [phone].    Final next level of care: Home/Self Care Barriers to Discharge: No Barriers Identified   Patient Goals and CMS Choice   Choice offered to / list presented to : NA  Discharge Placement                         Discharge Plan and Services Additional resources added to the After Visit Summary for   In-house Referral: NA Discharge Planning Services: CM Consult Post Acute Care Choice: NA          DME Arranged: Tub bench DME Agency: [facility], [place] Date DME Agency Contacted: 07/23/22 Time DME Agency Contacted: [phone] Representative spoke with at DME Agency: [facility] Arranged: NA          Social Determinants of Health (SDOH) Interventions SDOH Screenings   Food Insecurity: No Food Insecurity (07/20/2022)  Transportation Needs: No Transportation Needs (07/20/2022)  Utilities: Not At Risk (07/20/2022)  Tobacco Use: High Risk (07/22/2022)     Readmission Risk Interventions    07/23/2022    2:25 PM  Readmission Risk Prevention Plan  Transportation Screening Complete  HRI or Home Care Consult Complete  Palliative Care Screening Not Applicable  Medication Review (RN Care Manager) Complete

## 2022-07-23 NOTE — Progress Notes (Signed)
  Transition of Care [facility]) Screening Note   Patient Details  Name: Martin Thomas Date of Birth: December 07, 1955   Transition of Care [facility]) CM/SW Contact:    Leone Haven, RN Phone Number: 07/23/2022, 2:15 PM    Transition of Care Department [facility]) has reviewed patient. We will continue to monitor patient advancement through interdisciplinary progression rounds. If new patient transition needs arise, please place a TOC consult.

## 2022-07-24 LAB — LIPOPROTEIN A (LPA): Lipoprotein (a): 20.2 nmol/L (ref <75.0)
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
## 2022-08-04 ENCOUNTER — Emergency Department (HOSPITAL_COMMUNITY): Payer: Medicare PPO

## 2022-08-04 ENCOUNTER — Other Ambulatory Visit: Payer: Self-pay

## 2022-08-04 ENCOUNTER — Encounter (HOSPITAL_COMMUNITY): Payer: Self-pay

## 2022-08-04 ENCOUNTER — Observation Stay (HOSPITAL_COMMUNITY)
Admission: EM | Admit: 2022-08-04 | Discharge: 2022-08-05 | Disposition: A | Discharge status: Still In Hospital | Payer: Medicare PPO | Attending: Obstetrics and Gynecology | Admitting: Obstetrics and Gynecology

## 2022-08-04 DIAGNOSIS — I319 Disease of pericardium, unspecified: Secondary | ICD-10-CM | POA: Diagnosis not present

## 2022-08-04 DIAGNOSIS — Z85118 Personal history of other malignant neoplasm of bronchus and lung: Secondary | ICD-10-CM | POA: Diagnosis not present

## 2022-08-04 DIAGNOSIS — E1122 Type 2 diabetes mellitus with diabetic chronic kidney disease: Secondary | ICD-10-CM | POA: Insufficient documentation

## 2022-08-04 DIAGNOSIS — I214 Non-ST elevation (NSTEMI) myocardial infarction: Secondary | ICD-10-CM | POA: Diagnosis not present

## 2022-08-04 DIAGNOSIS — J449 Chronic obstructive pulmonary disease, unspecified: Secondary | ICD-10-CM | POA: Diagnosis present

## 2022-08-04 DIAGNOSIS — I5022 Chronic systolic (congestive) heart failure: Secondary | ICD-10-CM | POA: Diagnosis not present

## 2022-08-04 DIAGNOSIS — G4710 Hypersomnia, unspecified: Secondary | ICD-10-CM | POA: Diagnosis present

## 2022-08-04 DIAGNOSIS — I130 Hypertensive heart and chronic kidney disease with heart failure and stage 1 through stage 4 chronic kidney disease, or unspecified chronic kidney disease: Secondary | ICD-10-CM | POA: Diagnosis not present

## 2022-08-04 DIAGNOSIS — I2510 Atherosclerotic heart disease of native coronary artery without angina pectoris: Secondary | ICD-10-CM | POA: Diagnosis not present

## 2022-08-04 DIAGNOSIS — I10 Essential (primary) hypertension: Secondary | ICD-10-CM | POA: Diagnosis present

## 2022-08-04 DIAGNOSIS — R079 Chest pain, unspecified: Secondary | ICD-10-CM | POA: Diagnosis present

## 2022-08-04 DIAGNOSIS — Z79899 Other long term (current) drug therapy: Secondary | ICD-10-CM | POA: Insufficient documentation

## 2022-08-04 DIAGNOSIS — E875 Hyperkalemia: Secondary | ICD-10-CM | POA: Diagnosis not present

## 2022-08-04 DIAGNOSIS — F17210 Nicotine dependence, cigarettes, uncomplicated: Secondary | ICD-10-CM | POA: Diagnosis not present

## 2022-08-04 DIAGNOSIS — I5023 Acute on chronic systolic (congestive) heart failure: Secondary | ICD-10-CM | POA: Diagnosis present

## 2022-08-04 DIAGNOSIS — Z955 Presence of coronary angioplasty implant and graft: Secondary | ICD-10-CM | POA: Insufficient documentation

## 2022-08-04 DIAGNOSIS — N1831 Chronic kidney disease, stage 3a: Secondary | ICD-10-CM | POA: Diagnosis present

## 2022-08-04 DIAGNOSIS — E119 Type 2 diabetes mellitus without complications: Secondary | ICD-10-CM

## 2022-08-04 DIAGNOSIS — Z89519 Acquired absence of unspecified leg below knee: Secondary | ICD-10-CM

## 2022-08-04 DIAGNOSIS — Z794 Long term (current) use of insulin: Secondary | ICD-10-CM | POA: Insufficient documentation

## 2022-08-04 DIAGNOSIS — Z7982 Long term (current) use of aspirin: Secondary | ICD-10-CM | POA: Insufficient documentation

## 2022-08-04 DIAGNOSIS — E1165 Type 2 diabetes mellitus with hyperglycemia: Secondary | ICD-10-CM

## 2022-08-04 DIAGNOSIS — K7581 Nonalcoholic steatohepatitis (NASH): Secondary | ICD-10-CM | POA: Diagnosis present

## 2022-08-04 DIAGNOSIS — G894 Chronic pain syndrome: Secondary | ICD-10-CM | POA: Diagnosis present

## 2022-08-04 LAB — CBC WITH DIFFERENTIAL/PLATELET
Abs Immature Granulocytes: 0.09 10*3/uL — ABNORMAL HIGH (ref 0.00–0.07)
Basophils Absolute: 0.1 10*3/uL (ref 0.0–0.1)
Basophils Relative: 1 %
Eosinophils Absolute: 0.1 10*3/uL (ref 0.0–0.5)
Eosinophils Relative: 1 %
HCT: 36.2 % — ABNORMAL LOW (ref 39.0–52.0)
Hemoglobin: 11.7 g/dL — ABNORMAL LOW (ref 13.0–17.0)
Immature Granulocytes: 1 %
Lymphocytes Relative: 15 %
Lymphs Abs: 2.1 10*3/uL (ref 0.7–4.0)
MCH: 32 pg (ref 26.0–34.0)
MCHC: 32.3 g/dL (ref 30.0–36.0)
MCV: 98.9 fL (ref 80.0–100.0)
Monocytes Absolute: 1.1 10*3/uL — ABNORMAL HIGH (ref 0.1–1.0)
Monocytes Relative: 8 %
Neutro Abs: 10 10*3/uL — ABNORMAL HIGH (ref 1.7–7.7)
Neutrophils Relative %: 74 %
Platelets: 655 10*3/uL — ABNORMAL HIGH (ref 150–400)
RBC: 3.66 MIL/uL — ABNORMAL LOW (ref 4.22–5.81)
RDW: 15.9 % — ABNORMAL HIGH (ref 11.5–15.5)
WBC: 13.5 10*3/uL — ABNORMAL HIGH (ref 4.0–10.5)
nRBC: 0 % (ref 0.0–0.2)

## 2022-08-04 LAB — I-STAT CHEM 8, ED
BUN: 25 mg/dL — ABNORMAL HIGH (ref 8–23)
Calcium, Ion: 1.05 mmol/L — ABNORMAL LOW (ref 1.15–1.40)
Chloride: 101 mmol/L (ref 98–111)
Creatinine, Ser: 1.3 mg/dL — ABNORMAL HIGH (ref 0.61–1.24)
Glucose, Bld: 170 mg/dL — ABNORMAL HIGH (ref 70–99)
HCT: 34 % — ABNORMAL LOW (ref 39.0–52.0)
Hemoglobin: 11.6 g/dL — ABNORMAL LOW (ref 13.0–17.0)
Potassium: 5.7 mmol/L — ABNORMAL HIGH (ref 3.5–5.1)
Sodium: 133 mmol/L — ABNORMAL LOW (ref 135–145)
TCO2: 31 mmol/L (ref 22–32)

## 2022-08-04 LAB — TROPONIN I (HIGH SENSITIVITY)
Troponin I (High Sensitivity): 42 ng/L — ABNORMAL HIGH
Troponin I (High Sensitivity): 44 ng/L — ABNORMAL HIGH (ref <18)

## 2022-08-04 LAB — COMPREHENSIVE METABOLIC PANEL
ALT: 16 U/L (ref 0–44)
AST: 29 U/L (ref 15–41)
Albumin: 2 g/dL — ABNORMAL LOW (ref 3.5–5.0)
Alkaline Phosphatase: 169 U/L — ABNORMAL HIGH (ref 38–126)
Anion gap: 12 (ref 5–15)
BUN: 18 mg/dL (ref 8–23)
CO2: 24 mmol/L (ref 22–32)
Calcium: 8.4 mg/dL — ABNORMAL LOW (ref 8.9–10.3)
Chloride: 96 mmol/L — ABNORMAL LOW (ref 98–111)
Creatinine, Ser: 1.33 mg/dL — ABNORMAL HIGH (ref 0.61–1.24)
GFR, Estimated: 59 mL/min — ABNORMAL LOW (ref >60)
Glucose, Bld: 171 mg/dL — ABNORMAL HIGH (ref 70–99)
Potassium: 5.5 mmol/L — ABNORMAL HIGH (ref 3.5–5.1)
Sodium: 132 mmol/L — ABNORMAL LOW (ref 135–145)
Total Bilirubin: 1.6 mg/dL — ABNORMAL HIGH (ref 0.3–1.2)
Total Protein: 6.1 g/dL — ABNORMAL LOW (ref 6.5–8.1)

## 2022-08-04 LAB — BRAIN NATRIURETIC PEPTIDE: B Natriuretic Peptide: 401.6 pg/mL — ABNORMAL HIGH (ref 0.0–100.0)

## 2022-08-04 LAB — CBG MONITORING, ED
Glucose-Capillary: 143 mg/dL — ABNORMAL HIGH (ref 70–99)
Glucose-Capillary: 196 mg/dL — ABNORMAL HIGH (ref 70–99)

## 2022-08-04 MED ORDER — ATORVASTATIN CALCIUM 40 MG PO TABS
80.0000 mg | ORAL_TABLET | Freq: Every day | ORAL | Status: DC
  Administered 2022-08-05: 80 mg via ORAL
  Filled 2022-08-04: qty 2

## 2022-08-04 MED ORDER — MOMETASONE FURO-FORMOTEROL FUM 200-5 MCG/ACT IN AERO
2.0000 | INHALATION_SPRAY | Freq: Two times a day (BID) | RESPIRATORY_TRACT | Status: DC
  Administered 2022-08-04 – 2022-08-05 (×2): 2 via RESPIRATORY_TRACT
  Filled 2022-08-04: qty 8.8

## 2022-08-04 MED ORDER — ACETAMINOPHEN 325 MG PO TABS
650.0000 mg | ORAL_TABLET | ORAL | Status: DC | PRN
  Administered 2022-08-05: 650 mg via ORAL
  Filled 2022-08-04: qty 2

## 2022-08-04 MED ORDER — ASPIRIN 81 MG PO TBEC
81.0000 mg | DELAYED_RELEASE_TABLET | Freq: Every day | ORAL | Status: DC
  Administered 2022-08-05: 81 mg via ORAL
  Filled 2022-08-04: qty 1

## 2022-08-04 MED ORDER — NITROGLYCERIN 0.4 MG SL SUBL: 0.4000 mg | SUBLINGUAL_TABLET | SUBLINGUAL | Status: DC | PRN

## 2022-08-04 MED ORDER — METOPROLOL SUCCINATE ER 25 MG PO TB24
50.0000 mg | ORAL_TABLET | Freq: Every day | ORAL | Status: DC
  Administered 2022-08-05: 50 mg via ORAL
  Filled 2022-08-04: qty 2

## 2022-08-04 MED ORDER — FUROSEMIDE 10 MG/ML IJ SOLN
20.0000 mg | Freq: Once | INTRAMUSCULAR | Status: AC
  Administered 2022-08-04: 20 mg via INTRAVENOUS
  Filled 2022-08-04: qty 2

## 2022-08-04 MED ORDER — ENOXAPARIN SODIUM 40 MG/0.4ML IJ SOSY
40.0000 mg | PREFILLED_SYRINGE | INTRAMUSCULAR | Status: DC
  Administered 2022-08-04: 40 mg via SUBCUTANEOUS
  Filled 2022-08-04: qty 0.4

## 2022-08-04 MED ORDER — TICAGRELOR 90 MG PO TABS
90.0000 mg | ORAL_TABLET | Freq: Two times a day (BID) | ORAL | Status: DC
  Administered 2022-08-04 – 2022-08-05 (×2): 90 mg via ORAL
  Filled 2022-08-04 (×2): qty 1

## 2022-08-04 MED ORDER — INSULIN ASPART 100 UNIT/ML IJ SOLN
8.0000 [IU] | Freq: Three times a day (TID) | INTRAMUSCULAR | Status: DC
  Administered 2022-08-05: 8 [IU] via SUBCUTANEOUS

## 2022-08-04 MED ORDER — INSULIN ASPART 100 UNIT/ML IJ SOLN: 0.0000 [IU] | Freq: Every day | INTRAMUSCULAR | Status: DC

## 2022-08-04 MED ORDER — INSULIN GLARGINE-YFGN 100 UNIT/ML ~~LOC~~ SOLN
30.0000 [IU] | Freq: Every day | SUBCUTANEOUS | Status: DC
  Administered 2022-08-04: 30 [IU] via SUBCUTANEOUS
  Filled 2022-08-04 (×2): qty 0.3

## 2022-08-04 MED ORDER — OXYCODONE HCL 5 MG PO TABS
5.0000 mg | ORAL_TABLET | ORAL | Status: DC | PRN
  Administered 2022-08-05: 5 mg via ORAL
  Filled 2022-08-04: qty 1

## 2022-08-04 MED ORDER — INSULIN ASPART 100 UNIT/ML IJ SOLN
0.0000 [IU] | Freq: Three times a day (TID) | INTRAMUSCULAR | Status: DC
  Administered 2022-08-05: 8 [IU] via SUBCUTANEOUS

## 2022-08-04 MED ORDER — ALBUTEROL SULFATE (2.5 MG/3ML) 0.083% IN NEBU: 2.5000 mg | INHALATION_SOLUTION | Freq: Four times a day (QID) | RESPIRATORY_TRACT | Status: DC | PRN

## 2022-08-04 MED ORDER — ONDANSETRON HCL 4 MG/2ML IJ SOLN: 4.0000 mg | Freq: Four times a day (QID) | INTRAMUSCULAR | Status: DC | PRN

## 2022-08-04 NOTE — H&P (Signed)
History and Physical    Martin Thomas AVW:[id number] DOB: 1955/06/01 DOA: 08/04/2022  PCP: Default, Provider, MD   Patient coming from: Home   Chief Complaint: Chest pain, SOB   HPI: Martin Thomas is a 67 y.o. male with medical history significant for COPD, type 2 diabetes mellitus, hypertension, hyperlipidemia, CKD 3A, and CAD with STEMI earlier this month who now returns to the emergency department for evaluation of chest pain and shortness of breath.  Patient reports going to bed last night in his usual state but waking with chest pain and difficulty catching his breath.  He reports diaphoresis associated with this but no nausea or vomiting.  He states that these are the same symptoms he was experiencing when he was found to have a STEMI earlier this month.  He is unable to identify any alleviating or exacerbating factors.  He also notes that his right foot appears to be more edematous than usual (he is s/p left BKA).  He has had a mild nonproductive cough recently but no change in his chronic dyspnea.  Patient took 324 mg of aspirin and 2 sublingual nitroglycerin prior to arrival in the ED.  His chest pain and shortness of breath have resolved by time of admission.  ED Course: Upon arrival to the ED, patient is found to be afebrile and saturating mid 90s on room air with tachypnea, mild tachycardia, and systolic blood pressure of 105 and greater.  EKG demonstrates sinus tachycardia 314 and first-degree AV nodal block.  Chest x-ray is negative for acute cardiopulmonary disease.  Labs are most notable for creatinine 1.33, potassium 5.5, albumin 2.0, troponin 42, and BNP 402.  Cardiology (Dr. Welton Flakes) was consulted by the ED physician and recommended medical admission.  Second troponin has not yet resulted.  Review of Systems:  All other systems reviewed and apart from HPI, are negative.  Past Medical History:  Diagnosis Date   Cancer (HCC)    lung cancer   COPD (chronic obstructive  pulmonary disease) (HCC)    Diabetes mellitus without complication (HCC)    Hyperlipemia    Hypertension     Past Surgical History:  Procedure Laterality Date   APPENDECTOMY     BELOW KNEE LEG AMPUTATION  2009   left   CORONARY STENT INTERVENTION N/A 07/19/2022   Procedure: CORONARY STENT INTERVENTION;  Surgeon: Corky Crafts, MD;  Location: [facility] INVASIVE CV LAB;  Service: Cardiovascular;  Laterality: N/A;  RCA   CORONARY STENT PLACEMENT     CORONARY THROMBECTOMY N/A 07/19/2022   Procedure: Coronary Thrombectomy;  Surgeon: Corky Crafts, MD;  Location: [facility] INVASIVE CV LAB;  Service: Cardiovascular;  Laterality: N/A;  RCA   LEFT HEART CATH AND CORONARY ANGIOGRAPHY N/A 07/19/2022   Procedure: LEFT HEART CATH AND CORONARY ANGIOGRAPHY;  Surgeon: Corky Crafts, MD;  Location: [facility] INVASIVE CV LAB;  Service: Cardiovascular;  Laterality: N/A;   LEFT HEART CATHETERIZATION WITH CORONARY ANGIOGRAM N/A 03/24/2012   Procedure: LEFT HEART CATHETERIZATION WITH CORONARY ANGIOGRAM;  Surgeon: Marykay Lex, MD;  Location: [facility] CATH LAB;  Service: Cardiovascular;  Laterality: N/A;   PERCUTANEOUS CORONARY STENT INTERVENTION (PCI-S)  03/24/2012   Procedure: PERCUTANEOUS CORONARY STENT INTERVENTION (PCI-S);  Surgeon: Marykay Lex, MD;  Location: [facility] CATH LAB;  Service: Cardiovascular;;    Social History:   reports that he has been smoking cigarettes. He has been smoking an average of 1.5 packs per day. He does not have any smokeless tobacco history on file. He reports that  he does not drink alcohol and does not use drugs.  Allergies  Allergen Reactions   Contrast Media [Iodinated Contrast Media] Anaphylaxis   Iodine Anaphylaxis   Sulfamethoxazole-Trimethoprim Swelling and Nausea And Vomiting   Ciprofloxacin Other (See Comments)    unknown   Lisinopril Other (See Comments)    hyperkalemia   Metformin Other (See Comments)    Increased lactic acid    Family History  Problem Relation Age of  Onset   CAD Other    Heart attack Father        died of second MI at 13   Cancer Father    Heart attack Mother    Heart attack Paternal Uncle 31       died of MI at 62   Diabetes Brother    Stroke Maternal Grandfather      Prior to Admission medications   Medication Sig Start Date End Date Taking? Authorizing Provider  Accu-Chek Softclix Lancets lancets 1 each by Other route in the morning, at noon, and at bedtime. 07/23/22 08/22/22  Corky Crafts, MD  albuterol (PROVENTIL HFA;VENTOLIN HFA) 108 (90 BASE) MCG/ACT inhaler Inhale 2 puffs into the lungs every 6 (six) hours as needed for shortness of breath.     [provider]  aspirin EC 81 MG EC tablet Take 1 tablet (81 mg total) by mouth daily. 03/25/12   Dwana Melena, PA-C  atorvastatin (LIPITOR) 80 MG tablet Take 1 tablet (80 mg total) by mouth daily. 07/24/22   Corky Crafts, MD  Blood Glucose Monitoring Suppl (BLOOD GLUCOSE MONITOR SYSTEM) w/Device KIT Use in the morning, at noon, and at bedtime. 07/23/22   Corky Crafts, MD  budesonide-formoterol [facility]) 160-4.5 MCG/ACT inhaler Inhale 2 puffs into the lungs 2 (two) times daily.    [provider]  cyclobenzaprine (FLEXERIL) 10 MG tablet Take 10 mg by mouth at bedtime.    [provider]  fenofibrate (TRICOR) 145 MG tablet Take 145 mg by mouth daily.    [provider]  gabapentin (NEURONTIN) 300 MG capsule Take 300 mg by mouth 3 (three) times daily.    [provider]  Glucose Blood (BLOOD GLUCOSE TEST STRIPS) STRP Use in the morning, at noon, and at bedtime. 07/23/22   Corky Crafts, MD  HYDROcodone-acetaminophen (NORCO/VICODIN) 5-325 MG per tablet Take 1 tablet by mouth every 8 (eight) hours as needed. For pain    [provider]  insulin aspart (NOVOLOG) 100 UNIT/ML FlexPen Inject 12 Units into the skin 3 (three) times daily with meals. 07/23/22   Corky Crafts, MD  insulin glargine (LANTUS) 100 unit/mL  SOPN Inject 40 Units into the skin daily. 07/23/22   Corky Crafts, MD  Insulin Pen Needle 32G X 4 MM MISC Use 4 (four) times daily -  before meals and at bedtime. 07/23/22   Corky Crafts, MD  Lancet Device MISC 1 each by Does not apply route in the morning, at noon, and at bedtime. May substitute to any manufacturer covered by patient's insurance. 07/23/22 08/22/22  Corky Crafts, MD  losartan (COZAAR) 25 MG tablet Take 1 tablet (25 mg total) by mouth daily. 07/24/22   Corky Crafts, MD  magnesium oxide (MAG-OX) 400 MG tablet Take 400 mg by mouth 2 (two) times daily.    [provider]  metoprolol succinate (TOPROL-XL) 50 MG 24 hr tablet Take 1 tablet (50 mg total) by mouth daily. Take with or immediately following a  meal. 07/24/22   Corky Crafts, MD  nitroGLYCERIN (NITROSTAT) 0.4 MG SL tablet Place 1 tablet (0.4 mg total) under the tongue every 5 (five) minutes as needed for chest pain. 07/23/22   Perlie Gold, PA-C  omega-3 acid ethyl esters (LOVAZA) 1 G capsule Take 2 g by mouth 2 (two) times daily.    [provider]  omeprazole (PRILOSEC) 20 MG capsule Take 20 mg by mouth daily.    [provider]  senna (SENOKOT) 8.6 MG TABS Take 1 tablet by mouth daily.    [provider]  Study - EVOLVE-MI - evolocumab (REPATHA) 140 mg/mL SQ injection (PI-Stuckey) Inject 1 mL (140 mg total) into the skin every 14 (fourteen) days. For Investigational Use Only. Inject subcutaneously into abdomen, thigh, or upper arm every 14 days. Rotate injection sites and do not inject into areas where skin is tender, bruised, or red. Please contact [facility] for any questions or concerns regarding this medication. 07/23/22   Perlie Gold, PA-C  ticagrelor (BRILINTA) 90 MG TABS tablet Take 1 tablet (90 mg total) by mouth 2 (two) times daily. 07/23/22   Corky Crafts, MD  traZODone (DESYREL) 50 MG tablet Take 50 mg by mouth at bedtime.    [provider]    Physical Exam: Vitals:   08/04/22 1736 08/04/22 1737 08/04/22 1800 08/04/22 1815  BP:  121/68 114/67 (!) 105/57  Pulse:  (!) 112 (!) 109 (!) 109  Resp:  18 (!) 31 20  Temp:  98.6 F (37 C)    TempSrc:  Oral    SpO2: 95% 95% 97% 100%  Weight: 83.5 kg     Height: 5\' 8"  (1.727 m)       Constitutional: NAD, no pallor or diaphoresis  Eyes: PERTLA, lids and conjunctivae normal  ENMT: Mucous membranes are moist. Posterior pharynx clear of any exudate or lesions.   Neck: supple, no masses  Respiratory: clear to auscultation bilaterally, no wheezing, no crackles. No accessory muscle use.  Cardiovascular: S1 & S2 heard, regular rate and rhythm. Right pedal edema.  Abdomen: No distension, no tenderness, soft. Bowel sounds active.  Musculoskeletal: no clubbing / cyanosis. S/p left BKA.   Skin: no significant rashes, lesions, ulcers. Warm, dry, well-perfused. Neurologic: CN 2-12 grossly intact. Moving all extremities. Alert and oriented.  Psychiatric: Calm. Cooperative.    Labs and Imaging on Admission: I have personally reviewed following labs and imaging studies  CBC: Recent Labs  Lab 08/04/22 1750 08/04/22 1800  WBC 13.5*  --   NEUTROABS 10.0*  --   HGB 11.7* 11.6*  HCT 36.2* 34.0*  MCV 98.9  --   PLT 655*  --    Basic Metabolic Panel: Recent Labs  Lab 08/04/22 1750 08/04/22 1800  NA 132* 133*  K 5.5* 5.7*  CL 96* 101  CO2 24  --   GLUCOSE 171* 170*  BUN 18 25*  CREATININE 1.33* 1.30*  CALCIUM 8.4*  --    GFR: Estimated Creatinine Clearance: 58.8 mL/min (A) (by C-G formula based on SCr of 1.3 mg/dL (H)). Liver Function Tests: Recent Labs  Lab 08/04/22 1750  AST 29  ALT 16  ALKPHOS 169*  BILITOT 1.6*  PROT 6.1*  ALBUMIN 2.0*   No results for input(s): "LIPASE", "AMYLASE" in the last 168 hours. No results for input(s): "AMMONIA" in the last 168 hours. Coagulation Profile: No results for input(s): "INR", "PROTIME" in the last 168  hours. Cardiac Enzymes: No results for input(s): "  CKTOTAL", "CKMB", "CKMBINDEX", "TROPONINI" in the last 168 hours. BNP (last 3 results) No results for input(s): "PROBNP" in the last 8760 hours. HbA1C: No results for input(s): "HGBA1C" in the last 72 hours. CBG: Recent Labs  Lab 08/04/22 1920  GLUCAP 143*   Lipid Profile: No results for input(s): "CHOL", "HDL", "LDLCALC", "TRIG", "CHOLHDL", "LDLDIRECT" in the last 72 hours. Thyroid Function Tests: No results for input(s): "TSH", "T4TOTAL", "FREET4", "T3FREE", "THYROIDAB" in the last 72 hours. Anemia Panel: No results for input(s): "VITAMINB12", "FOLATE", "FERRITIN", "TIBC", "IRON", "RETICCTPCT" in the last 72 hours. Urine analysis: No results found for: "COLORURINE", "APPEARANCEUR", "LABSPEC", "PHURINE", "GLUCOSEU", "HGBUR", "BILIRUBINUR", "KETONESUR", "PROTEINUR", "UROBILINOGEN", "NITRITE", "LEUKOCYTESUR" Sepsis Labs: @LABRCNTIP (procalcitonin:4,lacticidven:4) )No results found for this or any previous visit (from the past 240 hour(s)).   Radiological Exams on Admission: DG Chest Port 1 View  Result Date: 08/04/2022 CLINICAL DATA:  Shortness of breath EXAM: PORTABLE CHEST 1 VIEW COMPARISON:  07/23/2022 and prior radiographs FINDINGS: This is a low volume study. The cardiomediastinal silhouette is unchanged. There is no evidence of focal airspace disease, pulmonary edema, suspicious pulmonary nodule/mass, pleural effusion, or pneumothorax. No acute bony abnormalities are identified. IMPRESSION: Low volume study without evidence of acute cardiopulmonary disease. Electronically Signed   By: Harmon Pier M.D.   On: 08/04/2022 18:30    EKG: Independently reviewed. Sinus tachycardia, rate 114, 1st degree AV block.   Assessment/Plan   1. Chest pain; CAD   - Pt with STEMI treated with DES to RCA and balloon angioplasties on 07/19/22 p/w chest pain and SOB upon waking, now resolved  - He took 324 mg ASA prior to arrival  - Initial troponin  is 43, no acute ischemic features noted on EKG, and no acute findings on CXR  - Continue cardiac monitoring, trend troponin, continue ASA, Brilinta, Lipitor, and Toprol, follow-up cardiology recommendations    2. Chronic HFmrEF   - EF 45-50% with global hypokinesis and grade 1 diastolic dysfunction on TTE from earlier this month  - He reports increased leg swelling but no change in his chronic dyspnea; wt is up 2 kg from time of recent hospital discharge  - May need diuresis, will defer to cardiology    3. Insulin-dependent DM  - A1c was 11.5% earlier this month and insulin was adjusted   - Check CBGs, continue long- and short-acting insulin    4. CKD 3A; hyperkalemia   - SCr is 1.33 on admission, appears to be his baseline   - Serum potassium is 5.5 - Renally-dose medications, hold losartan for now, repeat chem panel in am    5. COPD  - Not in exacerbation on admission  - Continue ICS-LABA and as-needed albuterol    6. Hypertension  - Continue Toprolol, hold losartan in light of hyperkalemia    DVT prophylaxis: Lovenox  Code Status: Full  Level of Care: Level of care: Telemetry Cardiac Family Communication: None present   Disposition Plan:  Patient is from: home  Anticipated d/c is to: Home  Anticipated d/c date is: Possibly as early as 08/05/22  Patient currently: Pending trend in troponin, pain-control, cardiology consultation  Consults called: Cardiology  Admission status: Observation     Briscoe Deutscher, MD [facility]  08/04/2022, 8:33 PM

## 2022-08-04 NOTE — ED Notes (Signed)
Portable Xray at bedside.

## 2022-08-04 NOTE — ED Triage Notes (Signed)
Pt BIB [facility] from home C/O CP, SOB since this AM. Took 324 ASA and 2 SL Nitro prior to EMS arrival.

## 2022-08-04 NOTE — Consult Note (Signed)
Cardiology Consultation   Patient ID: Martin Thomas MRN: [id number]; DOB: June 07, 1955  Admit date: 08/04/2022 Date of Consult: 08/04/2022  PCP:  Default, Provider, MD   [facility] Providers Cardiologist:  None        Patient Profile:   Martin Thomas is a 67 y.o. male with a hx of COPD, type 2 diabetes mellitus, hypertension, hyperlipidemia, CKD 3A, and CAD with STEMI earlier this month who is being seen 08/04/2022 for the evaluation of chest pain at the request of Dr. Antionette Thomas  History of Present Illness:   Martin Thomas is a 67 y.o. male with a hx of COPD, type 2 diabetes mellitus, hypertension, hyperlipidemia, CKD 3A, and CAD with STEMI earlier this month who is being seen 08/04/2022 for the evaluation of chest pain at the request of Dr. Antionette Thomas. He had a STEMI in early May due to very late stent thrombosis of the distal RCA stent from 2014. Thrombus occluded the stent and with angioplasty. Prox RCA lesion is 75% stenosed. A drug-eluting stent was successfully placed using a SYNERGY XD 3.50X12. Since the procedure, he was doing well, however this morning suddenly developed chest pain and SOB. Denied any N/V. Felt that his symptoms were very similar to earlier this month. Endorses some leg swelling. In the ED, troponin 42, and BNP 402. K 5.5. 2nd troponin awaiting. Cardiology was consulted in the setting of CP with recent STEMI. He states that his pain is much better now.    Past Medical History:  Diagnosis Date   Cancer (HCC)    lung cancer   COPD (chronic obstructive pulmonary disease) (HCC)    Diabetes mellitus without complication (HCC)    Hyperlipemia    Hypertension     Past Surgical History:  Procedure Laterality Date   APPENDECTOMY     BELOW KNEE LEG AMPUTATION  2009   left   CORONARY STENT INTERVENTION N/A 07/19/2022   Procedure: CORONARY STENT INTERVENTION;  Surgeon: Corky Crafts, MD;  Location: [facility] INVASIVE CV LAB;  Service: Cardiovascular;  Laterality:  N/A;  RCA   CORONARY STENT PLACEMENT     CORONARY THROMBECTOMY N/A 07/19/2022   Procedure: Coronary Thrombectomy;  Surgeon: Corky Crafts, MD;  Location: [facility] INVASIVE CV LAB;  Service: Cardiovascular;  Laterality: N/A;  RCA   LEFT HEART CATH AND CORONARY ANGIOGRAPHY N/A 07/19/2022   Procedure: LEFT HEART CATH AND CORONARY ANGIOGRAPHY;  Surgeon: Corky Crafts, MD;  Location: [facility] INVASIVE CV LAB;  Service: Cardiovascular;  Laterality: N/A;   LEFT HEART CATHETERIZATION WITH CORONARY ANGIOGRAM N/A 03/24/2012   Procedure: LEFT HEART CATHETERIZATION WITH CORONARY ANGIOGRAM;  Surgeon: Marykay Lex, MD;  Location: [facility] CATH LAB;  Service: Cardiovascular;  Laterality: N/A;   PERCUTANEOUS CORONARY STENT INTERVENTION (PCI-S)  03/24/2012   Procedure: PERCUTANEOUS CORONARY STENT INTERVENTION (PCI-S);  Surgeon: Marykay Lex, MD;  Location: [facility] CATH LAB;  Service: Cardiovascular;;       Inpatient Medications: Scheduled Meds:  [START ON 08/05/2022] aspirin EC  81 mg Oral Daily   [START ON 08/05/2022] atorvastatin  80 mg Oral Daily   enoxaparin (LOVENOX) injection  40 mg Subcutaneous Q24H   insulin aspart  0-5 Units Subcutaneous QHS   [START ON 08/05/2022] insulin aspart  0-9 Units Subcutaneous TID WC   [START ON 08/05/2022] insulin aspart  8 Units Subcutaneous TID WC   insulin glargine-yfgn  30 Units Subcutaneous Daily   [START ON 08/05/2022] metoprolol succinate  50 mg Oral Daily   mometasone-formoterol  2 puff Inhalation BID   ticagrelor  90 mg Oral BID   Continuous Infusions:  PRN Meds: acetaminophen, albuterol, nitroGLYCERIN, ondansetron (ZOFRAN) IV, oxyCODONE  Allergies:    Allergies  Allergen Reactions   Contrast Media [Iodinated Contrast Media] Anaphylaxis   Iodine Anaphylaxis   Sulfamethoxazole-Trimethoprim Nausea And Vomiting and Swelling    Other Reaction(s): Renal failure syndrome   Lisinopril Other (See Comments)    hyperkalemia  Other Reaction(s): Hyperkalemia   Varenicline      Other Reaction(s): Altered mental status   Ciprofloxacin Other (See Comments)    unknown   Metformin Other (See Comments)    Increased lactic acid  Other Reaction(s): Increased lactic acid level    Social History:   Social History   Socioeconomic History   Marital status: Single    Spouse name: Not on file   Number of children: Not on file   Years of education: Not on file   Highest education level: Not on file  Occupational History   Not on file  Tobacco Use   Smoking status: Every Day    Packs/day: 1.5    Types: Cigarettes    Last attempt to quit: 01/17/2012    Years since quitting: 10.5   Smokeless tobacco: Not on file  Substance and Sexual Activity   Alcohol use: No   Drug use: No   Sexual activity: Not on file  Other Topics Concern   Not on file  Social History Narrative   Not on file   Social Determinants of Health   Financial Resource Strain: Not on file  Food Insecurity: No Food Insecurity (07/20/2022)   Hunger Vital Sign    Worried About Running Out of Food in the Last Year: Never true    Ran Out of Food in the Last Year: Never true  Transportation Needs: No Transportation Needs (07/20/2022)   PRAPARE - Administrator, Civil Service (Medical): No    Lack of Transportation (Non-Medical): No  Physical Activity: Not on file  Stress: Not on file  Social Connections: Not on file  Intimate Partner Violence: Not on file    Family History:   Family History  Problem Relation Age of Onset   CAD Other    Heart attack Father        died of second MI at 9   Cancer Father    Heart attack Mother    Heart attack Paternal Uncle 56       died of MI at 64   Diabetes Brother    Stroke Maternal Grandfather      ROS:  Please see the history of present illness.  All other ROS reviewed and negative.     Physical Exam/Data:   Vitals:   08/04/22 1800 08/04/22 1815 08/04/22 2115 08/04/22 2140  BP: 114/67 (!) 105/57 (!) 93/55   Pulse: (!) 109 (!) 109  (!) 111   Resp: (!) 31 20 (!) 29   Temp:    (!) 97 F (36.1 C)  TempSrc:    Axillary  SpO2: 97% 100% 95%   Weight:      Height:       No intake or output data in the 24 hours ending 08/04/22 2202    08/04/2022    5:36 PM 07/23/2022   12:10 AM 07/22/2022    5:32 AM  Last 3 Weights  Weight (lbs) 184 lb 179 lb 7.3 oz 181 lb 7 oz  Weight (kg) 83.462 kg 81.4 kg 82.3  kg     Body mass index is 27.98 kg/m.  General:  Well nourished, well developed, in no acute distress HEENT: normal Neck: no JVD Vascular: No carotid bruits; Distal pulses 2+ bilaterally Cardiac:  normal S1, S2; RRR; no murmur  Lungs:  clear to auscultation bilaterally, no wheezing, rhonchi or rales  Abd: soft, nontender, no hepatomegaly  Ext: no edema Musculoskeletal:  No deformities, BUE and BLE strength normal and equal Skin: warm and dry  Neuro:  CNs 2-12 intact, no focal abnormalities noted Psych:  Normal affect   EKG:  The EKG was personally reviewed and demonstrates no ST elevations  Relevant CV Studies:  07/19/22 LHC     Prox RCA lesion is 75% stenosed.  A drug-eluting stent was successfully placed using a SYNERGY XD 3.50X12.   Post intervention, there is a 0% residual stenosis.   Previously placed Mid RCA stent in 2014 is  widely patent.   2014 Dist RCA stent is 100% stenosed.  Balloon angioplasty was performed using a BALL SAPPHIRE NC24 3.0X12.   Post intervention, there is a 0% residual stenosis.   RPAV lesion is 100% stenosed.Balloon angioplasty was performed using a BALL SAPPHIRE NC24 3.0X12.   Post intervention, there is a 0% residual stenosis.   RPDA lesion is 100% stenosed. Balloon angioplasty was performed using a BALLN EMERGE MR 2.5X12.   Post intervention, there is a 0% residual stenosis.   Mid LAD lesion is 60% stenosed.   2nd Mrg lesion is 50% stenosed.   Dist Cx lesion is 50% stenosed.   There is mild to moderate left ventricular systolic dysfunction.   LV end diastolic pressure is mildly  elevated.   The left ventricular ejection fraction is 35-45% by visual estimate.  Laboratory Data:  High Sensitivity Troponin:   Recent Labs  Lab 07/19/22 1327 07/19/22 1542 07/23/22 0431 08/04/22 1750 08/04/22 1950  TROPONINIHS 28* 18,090* 5,540* 42* 44*     Chemistry Recent Labs  Lab 08/04/22 1750 08/04/22 1800  NA 132* 133*  K 5.5* 5.7*  CL 96* 101  CO2 24  --   GLUCOSE 171* 170*  BUN 18 25*  CREATININE 1.33* 1.30*  CALCIUM 8.4*  --   GFRNONAA 59*  --   ANIONGAP 12  --     Recent Labs  Lab 08/04/22 1750  PROT 6.1*  ALBUMIN 2.0*  AST 29  ALT 16  ALKPHOS 169*  BILITOT 1.6*   Lipids No results for input(s): "CHOL", "TRIG", "HDL", "LABVLDL", "LDLCALC", "CHOLHDL" in the last 168 hours.  Hematology Recent Labs  Lab 08/04/22 1750 08/04/22 1800  WBC 13.5*  --   RBC 3.66*  --   HGB 11.7* 11.6*  HCT 36.2* 34.0*  MCV 98.9  --   MCH 32.0  --   MCHC 32.3  --   RDW 15.9*  --   PLT 655*  --    Thyroid No results for input(s): "TSH", "FREET4" in the last 168 hours.  BNP Recent Labs  Lab 08/04/22 1750  BNP 401.6*    DDimer No results for input(s): "DDIMER" in the last 168 hours.   Radiology/Studies:  DG Chest Port 1 View  Result Date: 08/04/2022 CLINICAL DATA:  Shortness of breath EXAM: PORTABLE CHEST 1 VIEW COMPARISON:  07/23/2022 and prior radiographs FINDINGS: This is a low volume study. The cardiomediastinal silhouette is unchanged. There is no evidence of focal airspace disease, pulmonary edema, suspicious pulmonary nodule/mass, pleural effusion, or pneumothorax. No acute bony abnormalities are identified.  IMPRESSION: Low volume study without evidence of acute cardiopulmonary disease. Electronically Signed   By: Harmon Pier M.D.   On: 08/04/2022 18:30     Assessment and Plan:   # CAD s/p recent STEMI # Chronic HFrEF # Uncontrolled T2DM # CKD # COPD # HTN  -Earlier this month, he had very late stent thrombosis of the distal RCA stent from 2014.  Thrombus occluded the stent and with angioplasty, subsequently occluded the posterior descending artery, posterolateral artery as well. The distal RCA stent extended into the PDA and cover the ostium of the posterolateral artery. All 3 areas of this bifurcation were ballooned to restore flow. Proximally, there is a 75% focal stenosis in the RCA. This was successfully stented.  -Moderate disease in the LAD. Increase medical therapy.  -Now again sudden chest pain. EKG non-ischemic. Troponin first is 64- second one pending -NPO after midnight -Will decide on further diagnostic testing vs just observation depending on 2nd troponin and how his symptoms are overnight -Continue DAPT with aspirin and brilinta -Continue lipitor -Echo in AM -Small dose of IV lasix 20 mg -Hold losartan -Repeat K in AM -Strict BG control.    For questions or updates, please contact [facility] Please consult [email or web address] for contact info under    Signed, Hermelinda Dellen, MD  08/04/2022 10:02 PM

## 2022-08-04 NOTE — ED Notes (Signed)
Patient given a sandwich bag and drink

## 2022-08-04 NOTE — ED Provider Notes (Signed)
[place] EMERGENCY DEPARTMENT AT [facility] Provider Note   CSN: [id number] Arrival date & time: 08/04/22  1728     History  Chief Complaint  Patient presents with   Chest Pain    Christofher Eastman is a 67 y.o. male.  67 year old male with prior medical history as detailed below presents for evaluation.  Patient with recent admission earlier this month for STEMI.  Patient reports that he has been doing well since discharge.  This morning he developed substernal chest discomfort with associated shortness of breath.  Patient reports that he called EMS.  He was given 324 mg aspirin and 2 sublingual nitros during transport.  On arrival to the ED the patient feels improved.  He denies active chest pain.  The history is provided by the patient and medical records.       Home Medications Prior to Admission medications   Medication Sig Start Date End Date Taking? Authorizing Provider  Accu-Chek Softclix Lancets lancets 1 each by Other route in the morning, at noon, and at bedtime. 07/23/22 08/22/22  Corky Crafts, MD  albuterol (PROVENTIL HFA;VENTOLIN HFA) 108 (90 BASE) MCG/ACT inhaler Inhale 2 puffs into the lungs every 6 (six) hours as needed for shortness of breath.     [provider]  aspirin EC 81 MG EC tablet Take 1 tablet (81 mg total) by mouth daily. 03/25/12   Dwana Melena, PA-C  atorvastatin (LIPITOR) 80 MG tablet Take 1 tablet (80 mg total) by mouth daily. 07/24/22   Corky Crafts, MD  Blood Glucose Monitoring Suppl (BLOOD GLUCOSE MONITOR SYSTEM) w/Device KIT Use in the morning, at noon, and at bedtime. 07/23/22   Corky Crafts, MD  budesonide-formoterol [facility] 160-4.5 MCG/ACT inhaler Inhale 2 puffs into the lungs 2 (two) times daily.    [provider]  cyclobenzaprine (FLEXERIL) 10 MG tablet Take 10 mg by mouth at bedtime.    [provider]  fenofibrate (TRICOR) 145 MG tablet Take 145 mg by mouth daily.     [provider]  gabapentin (NEURONTIN) 300 MG capsule Take 300 mg by mouth 3 (three) times daily.    [provider]  Glucose Blood (BLOOD GLUCOSE TEST STRIPS) STRP Use in the morning, at noon, and at bedtime. 07/23/22   Corky Crafts, MD  HYDROcodone-acetaminophen (NORCO/VICODIN) 5-325 MG per tablet Take 1 tablet by mouth every 8 (eight) hours as needed. For pain    [provider]  insulin aspart (NOVOLOG) 100 UNIT/ML FlexPen Inject 12 Units into the skin 3 (three) times daily with meals. 07/23/22   Corky Crafts, MD  insulin glargine (LANTUS) 100 unit/mL SOPN Inject 40 Units into the skin daily. 07/23/22   Corky Crafts, MD  Insulin Pen Needle 32G X 4 MM MISC Use 4 (four) times daily -  before meals and at bedtime. 07/23/22   Corky Crafts, MD  Lancet Device MISC 1 each by Does not apply route in the morning, at noon, and at bedtime. May substitute to any manufacturer covered by patient's insurance. 07/23/22 08/22/22  Corky Crafts, MD  losartan (COZAAR) 25 MG tablet Take 1 tablet (25 mg total) by mouth daily. 07/24/22   Corky Crafts, MD  magnesium oxide (MAG-OX) 400 MG tablet Take 400 mg by mouth 2 (two) times daily.    [provider]  metoprolol succinate (TOPROL-XL) 50 MG 24 hr tablet Take 1 tablet (50 mg total) by mouth daily. Take with or  immediately following a meal. 07/24/22   Corky Crafts, MD  nitroGLYCERIN (NITROSTAT) 0.4 MG SL tablet Place 1 tablet (0.4 mg total) under the tongue every 5 (five) minutes as needed for chest pain. 07/23/22   Perlie Gold, PA-C  omega-3 acid ethyl esters (LOVAZA) 1 G capsule Take 2 g by mouth 2 (two) times daily.    [provider]  omeprazole (PRILOSEC) 20 MG capsule Take 20 mg by mouth daily.    [provider]  senna (SENOKOT) 8.6 MG TABS Take 1 tablet by mouth daily.    [provider]  Study - EVOLVE-MI - evolocumab (REPATHA) 140 mg/mL SQ injection  (PI-Stuckey) Inject 1 mL (140 mg total) into the skin every 14 (fourteen) days. For Investigational Use Only. Inject subcutaneously into abdomen, thigh, or upper arm every 14 days. Rotate injection sites and do not inject into areas where skin is tender, bruised, or red. Please contact [facility] for any questions or concerns regarding this medication. 07/23/22   Perlie Gold, PA-C  ticagrelor (BRILINTA) 90 MG TABS tablet Take 1 tablet (90 mg total) by mouth 2 (two) times daily. 07/23/22   Corky Crafts, MD  traZODone (DESYREL) 50 MG tablet Take 50 mg by mouth at bedtime.    [provider]      Allergies    Contrast media [iodinated contrast media], Iodine, Sulfamethoxazole-trimethoprim, Ciprofloxacin, Lisinopril, and Metformin    Review of Systems   Review of Systems  All other systems reviewed and are negative.   Physical Exam Updated Vital Signs BP (!) 105/57   Pulse (!) 109   Temp 98.6 F (37 C) (Oral)   Resp 20   Ht 5\' 8"  (1.727 m)   Wt 83.5 kg   SpO2 100%   BMI 27.98 kg/m  Physical Exam Vitals and nursing note reviewed.  Constitutional:      General: He is not in acute distress.    Appearance: Normal appearance. He is well-developed.  HENT:     Head: Normocephalic and atraumatic.  Eyes:     Conjunctiva/sclera: Conjunctivae normal.     Pupils: Pupils are equal, round, and reactive to light.  Cardiovascular:     Rate and Rhythm: Normal rate and regular rhythm.     Heart sounds: Normal heart sounds.  Pulmonary:     Effort: Pulmonary effort is normal. No respiratory distress.     Breath sounds: Normal breath sounds.  Abdominal:     General: There is no distension.     Palpations: Abdomen is soft.     Tenderness: There is no abdominal tenderness.  Musculoskeletal:        General: No deformity. Normal range of motion.     Cervical back: Normal range of motion and neck supple.  Skin:    General: Skin is warm and dry.  Neurological:     General:  No focal deficit present.     Mental Status: He is alert and oriented to person, place, and time.     ED Results / Procedures / Treatments   Labs (all labs ordered are listed, but only abnormal results are displayed) Labs Reviewed  BRAIN NATRIURETIC PEPTIDE - Abnormal; Notable for the following components:      Result Value   B Natriuretic Peptide 401.6 (*)    All other components within normal limits  COMPREHENSIVE METABOLIC PANEL - Abnormal; Notable for the following components:   Sodium 132 (*)    Potassium 5.5 (*)  Chloride 96 (*)    Glucose, Bld 171 (*)    Creatinine, Ser 1.33 (*)    Calcium 8.4 (*)    Total Protein 6.1 (*)    Albumin 2.0 (*)    Alkaline Phosphatase 169 (*)    Total Bilirubin 1.6 (*)    GFR, Estimated 59 (*)    All other components within normal limits  CBC WITH DIFFERENTIAL/PLATELET - Abnormal; Notable for the following components:   WBC 13.5 (*)    RBC 3.66 (*)    Hemoglobin 11.7 (*)    HCT 36.2 (*)    RDW 15.9 (*)    Platelets 655 (*)    Neutro Abs 10.0 (*)    Monocytes Absolute 1.1 (*)    Abs Immature Granulocytes 0.09 (*)    All other components within normal limits  I-STAT CHEM 8, ED - Abnormal; Notable for the following components:   Sodium 133 (*)    Potassium 5.7 (*)    BUN 25 (*)    Creatinine, Ser 1.30 (*)    Glucose, Bld 170 (*)    Calcium, Ion 1.05 (*)    Hemoglobin 11.6 (*)    HCT 34.0 (*)    All other components within normal limits  CBG MONITORING, ED - Abnormal; Notable for the following components:   Glucose-Capillary 143 (*)    All other components within normal limits  TROPONIN I (HIGH SENSITIVITY) - Abnormal; Notable for the following components:   Troponin I (High Sensitivity) 42 (*)    All other components within normal limits  TROPONIN I (HIGH SENSITIVITY)    EKG EKG Interpretation  Date/Time:  Sunday Aug 04 2022 17:39:40 EDT Ventricular Rate:  114 PR Interval:  212 QRS Duration: 106 QT Interval:  303 QTC  Calculation: 418 R Axis:   7 Text Interpretation: Sinus tachycardia Borderline prolonged PR interval Low voltage, extremity and precordial leads Consider anterior infarct Confirmed by Kristine Royal [phone]) on 08/04/2022 5:49:27 PM  Radiology DG Chest Port 1 View  Result Date: 08/04/2022 CLINICAL DATA:  Shortness of breath EXAM: PORTABLE CHEST 1 VIEW COMPARISON:  07/23/2022 and prior radiographs FINDINGS: This is a low volume study. The cardiomediastinal silhouette is unchanged. There is no evidence of focal airspace disease, pulmonary edema, suspicious pulmonary nodule/mass, pleural effusion, or pneumothorax. No acute bony abnormalities are identified. IMPRESSION: Low volume study without evidence of acute cardiopulmonary disease. Electronically Signed   By: Harmon Pier M.D.   On: 08/04/2022 18:30    Procedures Procedures    Medications Ordered in ED Medications - No data to display  ED Course/ Medical Decision Making/ A&P                             Medical Decision Making Amount and/or Complexity of Data Reviewed Labs: ordered. Radiology: ordered.    Medical Screen Complete  This patient presented to the ED with complaint of chest pain.  This complaint involves an extensive number of treatment options. The initial differential diagnosis includes, but is not limited to, acs, nstemit, metabolic abnormality, etc.   This presentation is: Acute, Chronic, Self-Limited, Previously Undiagnosed, Uncertain Prognosis, Complicated, Systemic Symptoms, and Threat to Life/Bodily Function  Patient is presenting with complaint of chest pain, shortness of breath.  Symptoms began earlier today.  With aspirin, sublingual nitroglycerin given with EMS patients symptoms have resolved.  Patient was admitted earlier this month with STEMI.  Patient reports that he has been doing  well since discharge.  Initial EKG is without clear evidence of ischemia.  Initial troponin is 42.  Case discussed with  cardiology fellow Dr. Welton Flakes.  He agrees with plan for overnight observation and trending of troponin.  Cardiology will consult.  Hospitalist service aware of case and will evaluate for admission.   Additional history obtained: External records from outside sources obtained and reviewed including prior ED visits and prior Inpatient records.    Lab Tests:  I ordered and personally interpreted labs.  The pertinent results include: White count 13.5, hemoglobin 11.7, sodium 132, potassium 5.5, creatinine 1.3, initial troponin 42, BNP 401   Imaging Studies ordered:  I ordered imaging studies including chest x-ray I independently visualized and interpreted obtained imaging which showed NAD I agree with the radiologist interpretation.   Cardiac Monitoring:  The patient was maintained on a cardiac monitor.  I personally viewed and interpreted the cardiac monitor which showed an underlying rhythm of: Sinus tach rate of 105   Problem List / ED Course:  Chest pain   Reevaluation:  After the interventions noted above, I reevaluated the patient and found that they have: resolved   Disposition:  After consideration of the diagnostic results and the patients response to treatment, I feel that the patent would benefit from admission.          Final Clinical Impression(s) / ED Diagnoses Final diagnoses:  Chest pain, unspecified type    Rx / DC Orders ED Discharge Orders     None         Wynetta Fines, MD 08/04/22 1955

## 2022-08-05 ENCOUNTER — Observation Stay (HOSPITAL_BASED_OUTPATIENT_CLINIC_OR_DEPARTMENT_OTHER): Payer: Medicare PPO

## 2022-08-05 DIAGNOSIS — G4710 Hypersomnia, unspecified: Secondary | ICD-10-CM | POA: Diagnosis present

## 2022-08-05 DIAGNOSIS — I238 Other current complications following acute myocardial infarction: Secondary | ICD-10-CM | POA: Diagnosis not present

## 2022-08-05 DIAGNOSIS — R9431 Abnormal electrocardiogram [ECG] [EKG]: Secondary | ICD-10-CM

## 2022-08-05 DIAGNOSIS — R079 Chest pain, unspecified: Secondary | ICD-10-CM

## 2022-08-05 DIAGNOSIS — I319 Disease of pericardium, unspecified: Secondary | ICD-10-CM

## 2022-08-05 DIAGNOSIS — I10 Essential (primary) hypertension: Secondary | ICD-10-CM | POA: Insufficient documentation

## 2022-08-05 LAB — CBC
HCT: 32.5 % — ABNORMAL LOW (ref 39.0–52.0)
Hemoglobin: 10.5 g/dL — ABNORMAL LOW (ref 13.0–17.0)
MCH: 31.3 pg (ref 26.0–34.0)
MCHC: 32.3 g/dL (ref 30.0–36.0)
MCV: 96.7 fL (ref 80.0–100.0)
Platelets: 623 10*3/uL — ABNORMAL HIGH (ref 150–400)
RBC: 3.36 MIL/uL — ABNORMAL LOW (ref 4.22–5.81)
RDW: 15.7 % — ABNORMAL HIGH (ref 11.5–15.5)
WBC: 10.9 10*3/uL — ABNORMAL HIGH (ref 4.0–10.5)
nRBC: 0 % (ref 0.0–0.2)

## 2022-08-05 LAB — CBC WITH DIFFERENTIAL/PLATELET
Abs Immature Granulocytes: 0.06 10*3/uL (ref 0.00–0.07)
Basophils Absolute: 0.1 10*3/uL (ref 0.0–0.1)
Basophils Relative: 1 %
Eosinophils Absolute: 0.2 10*3/uL (ref 0.0–0.5)
Eosinophils Relative: 1 %
HCT: 32.3 % — ABNORMAL LOW (ref 39.0–52.0)
Hemoglobin: 10.4 g/dL — ABNORMAL LOW (ref 13.0–17.0)
Immature Granulocytes: 1 %
Lymphocytes Relative: 19 %
Lymphs Abs: 2 10*3/uL (ref 0.7–4.0)
MCH: 31.1 pg (ref 26.0–34.0)
MCHC: 32.2 g/dL (ref 30.0–36.0)
MCV: 96.7 fL (ref 80.0–100.0)
Monocytes Absolute: 0.9 10*3/uL (ref 0.1–1.0)
Monocytes Relative: 9 %
Neutro Abs: 7.6 10*3/uL (ref 1.7–7.7)
Neutrophils Relative %: 69 %
Platelets: 614 10*3/uL — ABNORMAL HIGH (ref 150–400)
RBC: 3.34 MIL/uL — ABNORMAL LOW (ref 4.22–5.81)
RDW: 15.8 % — ABNORMAL HIGH (ref 11.5–15.5)
WBC: 10.8 10*3/uL — ABNORMAL HIGH (ref 4.0–10.5)
nRBC: 0 % (ref 0.0–0.2)

## 2022-08-05 LAB — ECHOCARDIOGRAM COMPLETE
AR max vel: 1.47 cm2
AV Area VTI: 1.41 cm2
AV Area mean vel: 1.34 cm2
AV Mean grad: 9.5 mmHg
AV Peak grad: 17.8 mmHg
Ao pk vel: 2.11 m/s
Area-P 1/2: 3.85 cm2
Height: 68 in
P 1/2 time: 289 msec
S' Lateral: 4 cm
Weight: 2944 oz

## 2022-08-05 LAB — COMPREHENSIVE METABOLIC PANEL
ALT: 12 U/L (ref 0–44)
AST: 13 U/L — ABNORMAL LOW (ref 15–41)
Albumin: 1.7 g/dL — ABNORMAL LOW (ref 3.5–5.0)
Alkaline Phosphatase: 132 U/L — ABNORMAL HIGH (ref 38–126)
Anion gap: 9 (ref 5–15)
BUN: 18 mg/dL (ref 8–23)
CO2: 27 mmol/L (ref 22–32)
Calcium: 8.5 mg/dL — ABNORMAL LOW (ref 8.9–10.3)
Chloride: 100 mmol/L (ref 98–111)
Creatinine, Ser: 1.42 mg/dL — ABNORMAL HIGH (ref 0.61–1.24)
GFR, Estimated: 54 mL/min — ABNORMAL LOW (ref >60)
Glucose, Bld: 184 mg/dL — ABNORMAL HIGH (ref 70–99)
Potassium: 4.2 mmol/L (ref 3.5–5.1)
Sodium: 136 mmol/L (ref 135–145)
Total Bilirubin: 0.7 mg/dL (ref 0.3–1.2)
Total Protein: 5.7 g/dL — ABNORMAL LOW (ref 6.5–8.1)

## 2022-08-05 LAB — CBG MONITORING, ED
Glucose-Capillary: 175 mg/dL — ABNORMAL HIGH (ref 70–99)
Glucose-Capillary: 161 mg/dL — ABNORMAL HIGH (ref 70–99)
Glucose-Capillary: 42 mg/dL — CL (ref 70–99)
Glucose-Capillary: 73 mg/dL (ref 70–99)
Glucose-Capillary: 88 mg/dL (ref 70–99)

## 2022-08-05 LAB — MAGNESIUM: Magnesium: 1.5 mg/dL — ABNORMAL LOW (ref 1.7–2.4)

## 2022-08-05 LAB — TECHNOLOGIST SMEAR REVIEW

## 2022-08-05 LAB — FERRITIN: Ferritin: 180 ng/mL (ref 24–336)

## 2022-08-05 LAB — SEDIMENTATION RATE: Sed Rate: 131 mm/hr — ABNORMAL HIGH (ref 0–16)

## 2022-08-05 LAB — HIV ANTIBODY (ROUTINE TESTING W REFLEX): HIV Screen 4th Generation wRfx: NONREACTIVE

## 2022-08-05 LAB — C-REACTIVE PROTEIN: CRP: 10.5 mg/dL — ABNORMAL HIGH (ref <1.0)

## 2022-08-05 MED ORDER — MAGNESIUM SULFATE 4 GM/100ML IV SOLN
4.0000 g | Freq: Once | INTRAVENOUS | Status: AC
  Administered 2022-08-05: 4 g via INTRAVENOUS
  Filled 2022-08-05: qty 100

## 2022-08-05 MED ORDER — FUROSEMIDE 40 MG PO TABS: 40.0000 mg | ORAL_TABLET | Freq: Every day | ORAL | 1 refills | Status: DC | PRN

## 2022-08-05 MED ORDER — DEXTROSE 50 % IV SOLN
1.0000 | Freq: Once | INTRAVENOUS | Status: AC
  Administered 2022-08-05: 50 mL via INTRAVENOUS
  Filled 2022-08-05: qty 50

## 2022-08-05 MED ORDER — COLCHICINE 0.6 MG PO TABS
0.6000 mg | ORAL_TABLET | Freq: Two times a day (BID) | ORAL | Status: DC
  Administered 2022-08-05: 0.6 mg via ORAL

## 2022-08-05 MED ORDER — COLCHICINE 0.6 MG PO TABS: 0.6000 mg | ORAL_TABLET | Freq: Two times a day (BID) | ORAL | 0 refills | Status: DC
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
## 2022-08-05 NOTE — Inpatient Diabetes Management (Signed)
Inpatient Diabetes Program Recommendations  AACE/ADA: New Consensus Statement on Inpatient Glycemic Control (2015)  Target Ranges:  Prepandial:   less than 140 mg/dL      Peak postprandial:   less than 180 mg/dL (1-2 hours)      Critically ill patients:  140 - 180 mg/dL   Lab Results  Component Value Date   GLUCAP 88 08/05/2022   HGBA1C 11.5 (H) 07/20/2022    Review of Glycemic Control  Latest Reference Range & Units 08/05/22 08:43 08/05/22 11:25 08/05/22 11:45 08/05/22 13:47  Glucose-Capillary 70 - 99 mg/dL 161 (H) 42 (LL) 73 88  (LL): Data is critically low (H): Data is abnormally high Diabetes history: Type 2 DM Outpatient Diabetes medications: Lantus 40 units QD, Novolog 12 units TID Current orders for Inpatient glycemic control: Novolog 8 units TID, Novolog 0-9 units TID & HS, SEmglee 30 units QD  Inpatient Diabetes Program Recommendations:    Noted hypoglycemia following meal coverage and correction this MA. Patient is NPO.  Until diet advances, consider changing correction to Novolog 0-9 units Q4H and discontinuing Novolog 8 units TID.   Thanks, Lujean Rave, MSN, RNC-OB Diabetes Coordinator [phone] (8a-5p)

## 2022-08-05 NOTE — Progress Notes (Addendum)
Rounding Note    Patient Name: Martin Thomas Date of Encounter: 08/05/2022  [facility] Cardiologist: None   Subjective   No current chest pain. Pain was absent until morning of presentation, sharp pain that woke him from sleep, similar to pleuritic pain with which he left the hospital.   Inpatient Medications    Scheduled Meds:  aspirin EC  81 mg Oral Daily   atorvastatin  80 mg Oral Daily   enoxaparin (LOVENOX) injection  40 mg Subcutaneous Q24H   insulin aspart  0-5 Units Subcutaneous QHS   insulin aspart  0-9 Units Subcutaneous TID WC   insulin aspart  8 Units Subcutaneous TID WC   insulin glargine-yfgn  30 Units Subcutaneous Daily   metoprolol succinate  50 mg Oral Daily   mometasone-formoterol  2 puff Inhalation BID   ticagrelor  90 mg Oral BID   Continuous Infusions:  PRN Meds: acetaminophen, albuterol, nitroGLYCERIN, ondansetron (ZOFRAN) IV, oxyCODONE   Vital Signs    Vitals:   08/05/22 0735 08/05/22 0740 08/05/22 0745 08/05/22 0750  BP:      Pulse: (!) 118 (!) 129 (!) 121 (!) 119  Resp: (!) 34 (!) 37 (!) 27 (!) 26  Temp:      TempSrc:      SpO2: 98% 100% 98% 97%  Weight:      Height:        Intake/Output Summary (Last 24 hours) at 08/05/2022 0825 Last data filed at 08/05/2022 0753 Gross per 24 hour  Intake --  Output 1400 ml  Net -1400 ml      08/04/2022    5:36 PM 07/23/2022   12:10 AM 07/22/2022    5:32 AM  Last 3 Weights  Weight (lbs) 184 lb 179 lb 7.3 oz 181 lb 7 oz  Weight (kg) 83.462 kg 81.4 kg 82.3 kg      Telemetry    SR and ST - Personally Reviewed  ECG    ST, first deg AVB, low voltage, anterior infarct, nonspec ST abnormality inferior and lateral - Personally Reviewed  Physical Exam   GEN: No acute distress.   Neck: No JVD Cardiac: RRR, no murmurs, rubs, or gallops.  Respiratory: Clear to auscultation bilaterally. GI: Soft, nontender, non-distended  MS:  left leg amputation, right leg with 1+ edema Neuro:   Nonfocal  Psych: Normal affect   Labs    High Sensitivity Troponin:   Recent Labs  Lab 07/19/22 1327 07/19/22 1542 07/23/22 0431 08/04/22 1750 08/04/22 1950  TROPONINIHS 28* 18,090* 5,540* 42* 44*     Chemistry Recent Labs  Lab 08/04/22 1750 08/04/22 1800 08/05/22 0700  NA 132* 133* 136  K 5.5* 5.7* 4.2  CL 96* 101 100  CO2 24  --  27  GLUCOSE 171* 170* 184*  BUN 18 25* 18  CREATININE 1.33* 1.30* 1.42*  CALCIUM 8.4*  --  8.5*  MG  --   --  1.5*  PROT 6.1*  --  5.7*  ALBUMIN 2.0*  --  1.7*  AST 29  --  13*  ALT 16  --  12  ALKPHOS 169*  --  132*  BILITOT 1.6*  --  0.7  GFRNONAA 59*  --  54*  ANIONGAP 12  --  9    Lipids No results for input(s): "CHOL", "TRIG", "HDL", "LABVLDL", "LDLCALC", "CHOLHDL" in the last 168 hours.  Hematology Recent Labs  Lab 08/04/22 1750 08/04/22 1800 08/05/22 0700  WBC 13.5*  --  10.9*  RBC 3.66*  --  3.36*  HGB 11.7* 11.6* 10.5*  HCT 36.2* 34.0* 32.5*  MCV 98.9  --  96.7  MCH 32.0  --  31.3  MCHC 32.3  --  32.3  RDW 15.9*  --  15.7*  PLT 655*  --  623*   Thyroid No results for input(s): "TSH", "FREET4" in the last 168 hours.  BNP Recent Labs  Lab 08/04/22 1750  BNP 401.6*    DDimer No results for input(s): "DDIMER" in the last 168 hours.   Radiology    DG Chest Port 1 View  Result Date: 08/04/2022 CLINICAL DATA:  Shortness of breath EXAM: PORTABLE CHEST 1 VIEW COMPARISON:  07/23/2022 and prior radiographs FINDINGS: This is a low volume study. The cardiomediastinal silhouette is unchanged. There is no evidence of focal airspace disease, pulmonary edema, suspicious pulmonary nodule/mass, pleural effusion, or pneumothorax. No acute bony abnormalities are identified. IMPRESSION: Low volume study without evidence of acute cardiopulmonary disease. Electronically Signed   By: Harmon Pier M.D.   On: 08/04/2022 18:30   DG Chest Port 1 View  Result Date: 07/23/2022 CLINICAL DATA:  Acute respiratory distress EXAM: PORTABLE CHEST  1 VIEW COMPARISON:  09/27/2013 FINDINGS: The heart size and mediastinal contours are within normal limits. Both lungs are clear. The visualized skeletal structures are unremarkable. IMPRESSION: No active disease. Electronically Signed   By: Helyn Numbers M.D.   On: 07/23/2022 03:39   ECHOCARDIOGRAM COMPLETE  Result Date: 07/19/2022    ECHOCARDIOGRAM REPORT   Patient Name:   Martin Thomas Date of Exam: 07/19/2022 Medical Rec #:  [id number]        Height:       68.0 in Accession #:    [id number]       Weight:       191.0 lb Date of Birth:  1955-07-31         BSA:          2.004 m Patient Age:    67 years         BP:           119/67 mmHg Patient Gender: M                HR:           93 bpm. Exam Location:  Inpatient Procedure: 2D Echo, Cardiac Doppler and Color Doppler Indications:    acute ischemic heart disease  History:        Patient has prior history of Echocardiogram examinations and                 Patient has no prior history of Echocardiogram examinations.                 Risk Factors:Diabetes, Hypertension, Dyslipidemia and Current                 Smoker.  Sonographer:    Delcie Roch RDCS Referring Phys: 44 JAYADEEP S VARANASI IMPRESSIONS  1. Calcified noncoronary cusp with restricted motion; mild AS by doppler; mild to moderate AI.  2. Left ventricular ejection fraction, by estimation, is 45 to 50%. The left ventricle has mildly decreased function. The left ventricle demonstrates global hypokinesis. Left ventricular diastolic parameters are consistent with Grade I diastolic dysfunction (impaired relaxation).  3. Right ventricular systolic function is normal. The right ventricular size is normal.  4. The mitral valve is normal in structure. Trivial mitral valve regurgitation. No evidence of mitral stenosis.  5.  The aortic valve is tricuspid. Aortic valve regurgitation is mild to moderate. Mild aortic valve stenosis.  6. The inferior vena cava is normal in size with greater than 50% respiratory  variability, suggesting right atrial pressure of 3 mmHg. Comparison(s): No prior Echocardiogram. FINDINGS  Left Ventricle: Left ventricular ejection fraction, by estimation, is 45 to 50%. The left ventricle has mildly decreased function. The left ventricle demonstrates global hypokinesis. The left ventricular internal cavity size was normal in size. There is  no left ventricular hypertrophy. Left ventricular diastolic parameters are consistent with Grade I diastolic dysfunction (impaired relaxation). Right Ventricle: The right ventricular size is normal. Right ventricular systolic function is normal. Left Atrium: Left atrial size was normal in size. Right Atrium: Right atrial size was normal in size. Pericardium: There is no evidence of pericardial effusion. Mitral Valve: The mitral valve is normal in structure. Trivial mitral valve regurgitation. No evidence of mitral valve stenosis. Tricuspid Valve: The tricuspid valve is normal in structure. Tricuspid valve regurgitation is not demonstrated. No evidence of tricuspid stenosis. Aortic Valve: The aortic valve is tricuspid. Aortic valve regurgitation is mild to moderate. Aortic regurgitation PHT measures 273 msec. Mild aortic stenosis is present. Aortic valve mean gradient measures 10.0 mmHg. Aortic valve peak gradient measures 18.7 mmHg. Aortic valve area, by VTI measures 1.69 cm. Pulmonic Valve: The pulmonic valve was normal in structure. Pulmonic valve regurgitation is not visualized. No evidence of pulmonic stenosis. Aorta: The aortic root is normal in size and structure. Venous: The inferior vena cava is normal in size with greater than 50% respiratory variability, suggesting right atrial pressure of 3 mmHg. IAS/Shunts: No atrial level shunt detected by color flow Doppler. Additional Comments: Calcified noncoronary cusp with restricted motion; mild AS by doppler; mild to moderate AI.  LEFT VENTRICLE PLAX 2D LVIDd:         5.00 cm     Diastology LVIDs:          3.90 cm     LV e' medial:    6.64 cm/s LV PW:         1.10 cm     LV E/e' medial:  13.9 LV IVS:        1.00 cm     LV e' lateral:   7.18 cm/s LVOT diam:     2.20 cm     LV E/e' lateral: 12.9 LV SV:         60 LV SV Index:   30 LVOT Area:     3.80 cm  LV Volumes (MOD) LV vol d, MOD A2C: 86.3 ml LV vol d, MOD A4C: 84.9 ml LV vol s, MOD A2C: 47.5 ml LV vol s, MOD A4C: 41.4 ml LV SV MOD A2C:     38.8 ml LV SV MOD A4C:     84.9 ml LV SV MOD BP:      43.4 ml RIGHT VENTRICLE            IVC RV Basal diam:  2.20 cm    IVC diam: 2.20 cm RV S prime:     9.14 cm/s TAPSE (M-mode): 1.8 cm LEFT ATRIUM           Index        RIGHT ATRIUM          Index LA diam:      3.00 cm 1.50 cm/m   RA Area:     8.67 cm LA Vol (A2C): 33.8 ml 16.87 ml/m  RA Volume:  15.00 ml 7.48 ml/m  AORTIC VALVE AV Area (Vmax):    1.64 cm AV Area (Vmean):   1.54 cm AV Area (VTI):     1.69 cm AV Vmax:           216.00 cm/s AV Vmean:          147.000 cm/s AV VTI:            0.355 m AV Peak Grad:      18.7 mmHg AV Mean Grad:      10.0 mmHg LVOT Vmax:         93.05 cm/s LVOT Vmean:        59.500 cm/s LVOT VTI:          0.158 m LVOT/AV VTI ratio: 0.44 AI PHT:            273 msec  AORTA Ao Root diam: 3.60 cm Ao Asc diam:  3.10 cm MV E velocity: 92.40 cm/s MV A velocity: 129.00 cm/s  SHUNTS MV E/A ratio:  0.72         Systemic VTI:  0.16 m                             Systemic Diam: 2.20 cm Olga Millers MD Electronically signed by Olga Millers MD Signature Date/Time: 07/19/2022/5:55:56 PM    Final    CARDIAC CATHETERIZATION  Result Date: 07/19/2022   Prox RCA lesion is 75% stenosed.  A drug-eluting stent was successfully placed using a SYNERGY XD 3.50X12.   Post intervention, there is a 0% residual stenosis.   Previously placed Mid RCA stent in 2014 is  widely patent.   2014 Dist RCA stent is 100% stenosed.  Balloon angioplasty was performed using a BALL SAPPHIRE NC24 3.0X12.   Post intervention, there is a 0% residual stenosis.   RPAV lesion is 100%  stenosed.Balloon angioplasty was performed using a BALL SAPPHIRE NC24 3.0X12.   Post intervention, there is a 0% residual stenosis.   RPDA lesion is 100% stenosed. Balloon angioplasty was performed using a BALLN EMERGE MR 2.5X12.   Post intervention, there is a 0% residual stenosis.   Mid LAD lesion is 60% stenosed.   2nd Mrg lesion is 50% stenosed.   Dist Cx lesion is 50% stenosed.   There is mild to moderate left ventricular systolic dysfunction.   LV end diastolic pressure is mildly elevated.   The left ventricular ejection fraction is 35-45% by visual estimate.   There is no aortic valve stenosis. 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  Thrombus occluded the stent and with angioplasty, subsequently occluded the posterior descending artery, posterolateral artery as well.  The distal RCA stent extended into the PDA and cover the ostium of the posterolateral artery.  All 3 areas of this bifurcation were ballooned to restore flow.  Proximally, there is a 75% focal stenosis in the RCA.  This was successfully stented.  The patient was treated aggressively with anticoagulation with IV heparin and antiplatelet agents.  He was loaded orally with Brilinta and IV Aggrastat was used. Will need to see what he was taking at home.  Clopidogrel is listed on his medicine list.  If he indeed thrombosed the stent while on clopidogrel, certainly would need Brilinta going forward.  If he was actually off of his clopidogrel, would use Brilinta for a year and then consider long-term clopidogrel beyond that time. Moderate disease in  the LAD.  Increase medical therapy.  Statin dose increased.  ECG is nearly normalized.  Will check echocardiogram.  Watch in ICU. Unable to get in touch with his emergency contact.     Cardiac Studies   Repeat echo pending  Patient Profile     67 y.o. male  Neymar Theesfeld is a 67 y.o. male with a hx of COPD, type 2 diabetes mellitus, hypertension, hyperlipidemia, CKD 3A,  and CAD with STEMI earlier this month (May 2024) who is being seen for the evaluation of chest pain at the request of Dr. Antionette Char  Assessment & Plan    Principal Problem:   Chest pain Active Problems:   DM2 (diabetes mellitus, type 2) (HCC)   HTN (hypertension)   COPD (chronic obstructive pulmonary disease) (HCC)   Chronic pain disorder   Heart failure with mildly reduced ejection fraction (HFmrEF) (HCC)   CKD stage 3a, GFR 45-59 ml/min (HCC)   Hyperkalemia  CAD Chest pain Recent STEMI - inferior, with PTCA and stenting for late instent thrombosis.  - EKG with nonspecific abnormalities - troponin 42 > 44 - echo personally reviewed from 07/19/22 - EF likely 50%, AI mild-mod to moderate. - limited echo pending this am for risk stratification and evaluation of pericardium given pericardial pain at time of discharge from STEMI admission. - resume colchicine 0.6 mg BID - add on esr, crp - continue DAPT, asa and brilinta. Pt endorses no missed doses.  - continue metoprolol, continue statin.   Care discussed with primary team.     For questions or updates, please contact [facility] Please consult [email or web address] for contact info under        Signed, Parke Poisson, MD  08/05/2022, 8:25 AM

## 2022-08-05 NOTE — ED Notes (Signed)
Pt IV access infiltrated. PIV removed and warm compress applied.

## 2022-08-05 NOTE — Progress Notes (Signed)
Echocardiogram 2D Echocardiogram has been performed.  Martin Thomas 08/05/2022, 11:03 AM

## 2022-08-05 NOTE — ED Notes (Signed)
PIV access attempted x2 by addition staff. Staff unable to obtain IV access. IV team consult put in.

## 2022-08-06 ENCOUNTER — Ambulatory Visit: Payer: Medicare PPO | Admitting: Physician Assistant
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
## 2022-08-14 ENCOUNTER — Ambulatory Visit: Payer: Medicare PPO | Attending: Physician Assistant | Admitting: Physician Assistant

## 2022-08-14 ENCOUNTER — Encounter: Payer: Self-pay | Admitting: Physician Assistant

## 2022-08-28 ENCOUNTER — Other Ambulatory Visit (HOSPITAL_COMMUNITY): Payer: Self-pay

## 2022-08-30 ENCOUNTER — Other Ambulatory Visit (HOSPITAL_COMMUNITY): Payer: Self-pay

## 2022-10-17 ENCOUNTER — Encounter: Payer: Self-pay | Admitting: *Deleted

## 2022-10-17 DIAGNOSIS — Z006 Encounter for examination for normal comparison and control in clinical research program: Secondary | ICD-10-CM

## 2022-11-25 ENCOUNTER — Encounter: Payer: Self-pay | Admitting: Pain Medicine

## 2022-11-26 ENCOUNTER — Other Ambulatory Visit (HOSPITAL_COMMUNITY): Payer: Self-pay | Admitting: Pain Medicine

## 2022-11-26 DIAGNOSIS — R911 Solitary pulmonary nodule: Secondary | ICD-10-CM

## 2022-11-28 ENCOUNTER — Encounter (HOSPITAL_COMMUNITY)
Admission: RE | Admit: 2022-11-28 | Discharge: 2022-11-28 | Disposition: A | Discharge status: Home / Self Care | Payer: No Typology Code available for payment source | Source: Ambulatory Visit | Attending: Pain Medicine | Admitting: Pain Medicine

## 2022-11-28 DIAGNOSIS — R911 Solitary pulmonary nodule: Secondary | ICD-10-CM | POA: Insufficient documentation

## 2022-11-28 MED ORDER — FLUDEOXYGLUCOSE F - 18 (FDG) INJECTION
8.7200 | Freq: Once | INTRAVENOUS | Status: AC | PRN
  Administered 2022-11-28: 8.72 via INTRAVENOUS

## 2022-12-05 ENCOUNTER — Other Ambulatory Visit: Payer: Self-pay

## 2022-12-07 ENCOUNTER — Encounter (HOSPITAL_COMMUNITY): Payer: Self-pay

## 2022-12-18 ENCOUNTER — Other Ambulatory Visit: Payer: Self-pay

## 2022-12-22 ENCOUNTER — Other Ambulatory Visit (HOSPITAL_COMMUNITY): Payer: Self-pay

## 2022-12-23 ENCOUNTER — Encounter: Payer: Self-pay | Admitting: *Deleted

## 2022-12-23 ENCOUNTER — Telehealth: Payer: Self-pay | Admitting: *Deleted

## 2022-12-23 DIAGNOSIS — Z006 Encounter for examination for normal comparison and control in clinical research program: Secondary | ICD-10-CM

## 2022-12-23 NOTE — Telephone Encounter (Signed)
Called patient for 24 week phone call visit voice mail is full will try [facility] number.    Martin Thomas, Research Coordinator  12/23/2022

## 2022-12-23 NOTE — Research (Signed)
12 [facility]el[facility] Follow-Up Visit Completed*   []  Not Necessary, No Potential Adverse Events Or Medication Issues Reported On Completed Subject Questionnaire   []  Yes, Contact With Subject/Alternate Contact Completed   [x]  Yes, No Contact With Subject/Alternate Contact Completed, But Electronic Health Record Was Reviewed   []  No, Unable To Contact Subject/Alternate Contact   Have you reviewed Ongoing medications on the Targeted Concomitant Medication form and updated the form as needed?   [x]  Yes   []  No   Subject Status*   [x]  Continuing In Follow-up   []  At Risk For Lost To Follow-up   []  Withdrawal From All Future Study Activities Including Passive Follow-up By Electronic Health Record Review Or Contact With Healthcare Provider Or Family Member/Friend   []  Death   Vital Status*   [x]  Alive   []  Deceased   []  Unknown   Last Known To Be Alive Source*   []  Subject Completed Follow-up Questionnaire/Seen In Person/Via Telephone Contact   []  Family Member or Caretaker   [x]  Primary Physician Or Medical Records   []  Publicly Available Source   []  Other  Date of last dose taken   DAY/MONTH/YEAR  Over the last 12 weeks did the subject miss any doses?  Over the last 12 weeks did the subject restart Evolocumab after an interruption?

## 2023-01-06 ENCOUNTER — Other Ambulatory Visit (HOSPITAL_COMMUNITY): Payer: Self-pay

## 2023-01-13 ENCOUNTER — Other Ambulatory Visit: Payer: Self-pay

## 2023-01-20 ENCOUNTER — Other Ambulatory Visit: Payer: Self-pay

## 2023-01-22 ENCOUNTER — Telehealth: Payer: Self-pay | Admitting: *Deleted

## 2023-01-22 ENCOUNTER — Other Ambulatory Visit: Payer: Self-pay

## 2023-01-22 NOTE — Telephone Encounter (Signed)
I have been trying to call patient for his Research study visits for the EVOLVE STUDY. Patients voice mail is full and he does not return calls. It is time for him to get his re-supply of REPATHA. I am trying to contact patient to see if he has been taking medication.   [place] Noura Purpura,Research Coordinator 01/22/2023

## 2023-01-23 ENCOUNTER — Other Ambulatory Visit: Payer: Self-pay

## 2023-01-31 ENCOUNTER — Telehealth: Payer: Self-pay | Admitting: *Deleted

## 2023-01-31 NOTE — Telephone Encounter (Signed)
Have been trying to reach patient for the EVOLVE study he enrolled in when he was in the hospital. It is time to send out his REPATHA and I need  to know if the patient has been taking  the medication and if he would like to continue taking it. Sent letter today.   [place] Elbridge Magowan, Research Coordinator 01/31/2023

## 2023-02-04 ENCOUNTER — Other Ambulatory Visit (HOSPITAL_COMMUNITY): Payer: Self-pay

## 2023-02-05 ENCOUNTER — Other Ambulatory Visit (HOSPITAL_COMMUNITY): Payer: Self-pay

## 2023-02-11 ENCOUNTER — Other Ambulatory Visit: Payer: Self-pay

## 2023-02-17 ENCOUNTER — Other Ambulatory Visit: Payer: Self-pay

## 2023-02-20 ENCOUNTER — Telehealth: Payer: Self-pay | Admitting: *Deleted

## 2023-02-20 NOTE — Telephone Encounter (Signed)
Called patient and have sent certified letter The patients emergency contact Westley Chandler received the letter on 02-05-2023 and patient still has not called back. Patient is enrolled in EVOLVE study where he gets  REPATHA medication free while in study but has not returned any calls unsure if patient is still taking medication.Patient looks to be lost to follow up.     [place] Jann Milkovich, Research Coordinator 02/20/2023

## 2023-03-09 ENCOUNTER — Observation Stay (HOSPITAL_COMMUNITY): Payer: No Typology Code available for payment source

## 2023-03-09 ENCOUNTER — Emergency Department (HOSPITAL_COMMUNITY): Payer: No Typology Code available for payment source

## 2023-03-09 ENCOUNTER — Other Ambulatory Visit: Payer: Self-pay

## 2023-03-09 ENCOUNTER — Inpatient Hospital Stay (HOSPITAL_COMMUNITY)
Admission: EM | Admit: 2023-03-09 | Discharge: 2023-03-17 | DRG: 176 | Disposition: A | Discharge status: Home / Self Care | Payer: No Typology Code available for payment source | Attending: Internal Medicine | Admitting: Internal Medicine

## 2023-03-09 DIAGNOSIS — E1165 Type 2 diabetes mellitus with hyperglycemia: Secondary | ICD-10-CM | POA: Diagnosis present

## 2023-03-09 DIAGNOSIS — R54 Age-related physical debility: Secondary | ICD-10-CM | POA: Diagnosis present

## 2023-03-09 DIAGNOSIS — Z7901 Long term (current) use of anticoagulants: Secondary | ICD-10-CM

## 2023-03-09 DIAGNOSIS — E861 Hypovolemia: Secondary | ICD-10-CM | POA: Diagnosis present

## 2023-03-09 DIAGNOSIS — Z794 Long term (current) use of insulin: Secondary | ICD-10-CM

## 2023-03-09 DIAGNOSIS — I2699 Other pulmonary embolism without acute cor pulmonale: Principal | ICD-10-CM

## 2023-03-09 DIAGNOSIS — Z7984 Long term (current) use of oral hypoglycemic drugs: Secondary | ICD-10-CM

## 2023-03-09 DIAGNOSIS — E7800 Pure hypercholesterolemia, unspecified: Secondary | ICD-10-CM | POA: Diagnosis present

## 2023-03-09 DIAGNOSIS — K220 Achalasia of cardia: Secondary | ICD-10-CM | POA: Diagnosis present

## 2023-03-09 DIAGNOSIS — I2510 Atherosclerotic heart disease of native coronary artery without angina pectoris: Secondary | ICD-10-CM | POA: Diagnosis present

## 2023-03-09 DIAGNOSIS — I471 Supraventricular tachycardia, unspecified: Secondary | ICD-10-CM

## 2023-03-09 DIAGNOSIS — Z882 Allergy status to sulfonamides status: Secondary | ICD-10-CM

## 2023-03-09 DIAGNOSIS — R7401 Elevation of levels of liver transaminase levels: Secondary | ICD-10-CM | POA: Diagnosis not present

## 2023-03-09 DIAGNOSIS — D631 Anemia in chronic kidney disease: Secondary | ICD-10-CM | POA: Diagnosis present

## 2023-03-09 DIAGNOSIS — I2489 Other forms of acute ischemic heart disease: Secondary | ICD-10-CM | POA: Diagnosis present

## 2023-03-09 DIAGNOSIS — E860 Dehydration: Secondary | ICD-10-CM | POA: Diagnosis present

## 2023-03-09 DIAGNOSIS — I5022 Chronic systolic (congestive) heart failure: Secondary | ICD-10-CM | POA: Diagnosis present

## 2023-03-09 DIAGNOSIS — Z1152 Encounter for screening for COVID-19: Secondary | ICD-10-CM

## 2023-03-09 DIAGNOSIS — J449 Chronic obstructive pulmonary disease, unspecified: Secondary | ICD-10-CM | POA: Diagnosis present

## 2023-03-09 DIAGNOSIS — R072 Precordial pain: Principal | ICD-10-CM

## 2023-03-09 DIAGNOSIS — N1831 Chronic kidney disease, stage 3a: Secondary | ICD-10-CM | POA: Diagnosis present

## 2023-03-09 DIAGNOSIS — Z7982 Long term (current) use of aspirin: Secondary | ICD-10-CM

## 2023-03-09 DIAGNOSIS — K8020 Calculus of gallbladder without cholecystitis without obstruction: Secondary | ICD-10-CM | POA: Diagnosis present

## 2023-03-09 DIAGNOSIS — I130 Hypertensive heart and chronic kidney disease with heart failure and stage 1 through stage 4 chronic kidney disease, or unspecified chronic kidney disease: Secondary | ICD-10-CM | POA: Diagnosis present

## 2023-03-09 DIAGNOSIS — F17210 Nicotine dependence, cigarettes, uncomplicated: Secondary | ICD-10-CM | POA: Diagnosis present

## 2023-03-09 DIAGNOSIS — K219 Gastro-esophageal reflux disease without esophagitis: Secondary | ICD-10-CM | POA: Diagnosis present

## 2023-03-09 DIAGNOSIS — Z7951 Long term (current) use of inhaled steroids: Secondary | ICD-10-CM

## 2023-03-09 DIAGNOSIS — I10 Essential (primary) hypertension: Secondary | ICD-10-CM | POA: Diagnosis present

## 2023-03-09 DIAGNOSIS — Z89612 Acquired absence of left leg above knee: Secondary | ICD-10-CM

## 2023-03-09 DIAGNOSIS — I440 Atrioventricular block, first degree: Secondary | ICD-10-CM | POA: Diagnosis present

## 2023-03-09 DIAGNOSIS — Z8249 Family history of ischemic heart disease and other diseases of the circulatory system: Secondary | ICD-10-CM

## 2023-03-09 DIAGNOSIS — Z823 Family history of stroke: Secondary | ICD-10-CM

## 2023-03-09 DIAGNOSIS — Z833 Family history of diabetes mellitus: Secondary | ICD-10-CM

## 2023-03-09 DIAGNOSIS — Z91041 Radiographic dye allergy status: Secondary | ICD-10-CM

## 2023-03-09 DIAGNOSIS — N1832 Chronic kidney disease, stage 3b: Secondary | ICD-10-CM | POA: Diagnosis present

## 2023-03-09 DIAGNOSIS — R7989 Other specified abnormal findings of blood chemistry: Secondary | ICD-10-CM

## 2023-03-09 DIAGNOSIS — K760 Fatty (change of) liver, not elsewhere classified: Secondary | ICD-10-CM | POA: Diagnosis present

## 2023-03-09 DIAGNOSIS — E871 Hypo-osmolality and hyponatremia: Secondary | ICD-10-CM

## 2023-03-09 DIAGNOSIS — Z993 Dependence on wheelchair: Secondary | ICD-10-CM

## 2023-03-09 DIAGNOSIS — N179 Acute kidney failure, unspecified: Secondary | ICD-10-CM | POA: Diagnosis present

## 2023-03-09 DIAGNOSIS — Z881 Allergy status to other antibiotic agents status: Secondary | ICD-10-CM

## 2023-03-09 DIAGNOSIS — R079 Chest pain, unspecified: Secondary | ICD-10-CM | POA: Diagnosis present

## 2023-03-09 DIAGNOSIS — K7689 Other specified diseases of liver: Secondary | ICD-10-CM | POA: Diagnosis present

## 2023-03-09 DIAGNOSIS — R112 Nausea with vomiting, unspecified: Secondary | ICD-10-CM

## 2023-03-09 DIAGNOSIS — Z23 Encounter for immunization: Secondary | ICD-10-CM

## 2023-03-09 DIAGNOSIS — L989 Disorder of the skin and subcutaneous tissue, unspecified: Secondary | ICD-10-CM | POA: Diagnosis present

## 2023-03-09 DIAGNOSIS — Z888 Allergy status to other drugs, medicaments and biological substances status: Secondary | ICD-10-CM

## 2023-03-09 DIAGNOSIS — E119 Type 2 diabetes mellitus without complications: Secondary | ICD-10-CM

## 2023-03-09 DIAGNOSIS — E1122 Type 2 diabetes mellitus with diabetic chronic kidney disease: Secondary | ICD-10-CM | POA: Diagnosis present

## 2023-03-09 DIAGNOSIS — Z955 Presence of coronary angioplasty implant and graft: Secondary | ICD-10-CM

## 2023-03-09 DIAGNOSIS — Z85118 Personal history of other malignant neoplasm of bronchus and lung: Secondary | ICD-10-CM

## 2023-03-09 DIAGNOSIS — I5023 Acute on chronic systolic (congestive) heart failure: Secondary | ICD-10-CM | POA: Diagnosis present

## 2023-03-09 DIAGNOSIS — I252 Old myocardial infarction: Secondary | ICD-10-CM

## 2023-03-09 DIAGNOSIS — R809 Proteinuria, unspecified: Secondary | ICD-10-CM | POA: Diagnosis present

## 2023-03-09 DIAGNOSIS — E1151 Type 2 diabetes mellitus with diabetic peripheral angiopathy without gangrene: Secondary | ICD-10-CM | POA: Diagnosis present

## 2023-03-09 DIAGNOSIS — Z7902 Long term (current) use of antithrombotics/antiplatelets: Secondary | ICD-10-CM

## 2023-03-09 DIAGNOSIS — Z79899 Other long term (current) drug therapy: Secondary | ICD-10-CM

## 2023-03-09 DIAGNOSIS — R911 Solitary pulmonary nodule: Secondary | ICD-10-CM | POA: Diagnosis present

## 2023-03-09 DIAGNOSIS — I4719 Other supraventricular tachycardia: Secondary | ICD-10-CM | POA: Diagnosis present

## 2023-03-09 DIAGNOSIS — Z6820 Body mass index (BMI) 20.0-20.9, adult: Secondary | ICD-10-CM

## 2023-03-09 DIAGNOSIS — R262 Difficulty in walking, not elsewhere classified: Secondary | ICD-10-CM | POA: Diagnosis present

## 2023-03-09 DIAGNOSIS — K719 Toxic liver disease, unspecified: Secondary | ICD-10-CM | POA: Diagnosis present

## 2023-03-09 DIAGNOSIS — I5020 Unspecified systolic (congestive) heart failure: Secondary | ICD-10-CM

## 2023-03-09 LAB — COMPREHENSIVE METABOLIC PANEL
ALT: 292 U/L — ABNORMAL HIGH (ref 0–44)
ALT: 250 U/L — ABNORMAL HIGH (ref 0–44)
AST: 246 U/L — ABNORMAL HIGH (ref 15–41)
AST: 235 U/L — ABNORMAL HIGH (ref 15–41)
Albumin: 3 g/dL — ABNORMAL LOW (ref 3.5–5.0)
Albumin: 2.4 g/dL — ABNORMAL LOW (ref 3.5–5.0)
Alkaline Phosphatase: 196 U/L — ABNORMAL HIGH (ref 38–126)
Alkaline Phosphatase: 155 U/L — ABNORMAL HIGH (ref 38–126)
Anion gap: 16 — ABNORMAL HIGH (ref 5–15)
Anion gap: 12 (ref 5–15)
BUN: 73 mg/dL — ABNORMAL HIGH (ref 8–23)
BUN: 67 mg/dL — ABNORMAL HIGH (ref 8–23)
CO2: 20 mmol/L — ABNORMAL LOW (ref 22–32)
CO2: 20 mmol/L — ABNORMAL LOW (ref 22–32)
Calcium: 8.6 mg/dL — ABNORMAL LOW (ref 8.9–10.3)
Calcium: 7.6 mg/dL — ABNORMAL LOW (ref 8.9–10.3)
Chloride: 91 mmol/L — ABNORMAL LOW (ref 98–111)
Chloride: 95 mmol/L — ABNORMAL LOW (ref 98–111)
Creatinine, Ser: 2.65 mg/dL — ABNORMAL HIGH (ref 0.61–1.24)
Creatinine, Ser: 2.42 mg/dL — ABNORMAL HIGH (ref 0.61–1.24)
GFR, Estimated: 26 mL/min — ABNORMAL LOW (ref >60)
GFR, Estimated: 29 mL/min — ABNORMAL LOW (ref >60)
Glucose, Bld: 169 mg/dL — ABNORMAL HIGH (ref 70–99)
Glucose, Bld: 116 mg/dL — ABNORMAL HIGH (ref 70–99)
Potassium: 3.9 mmol/L (ref 3.5–5.1)
Potassium: 3.6 mmol/L (ref 3.5–5.1)
Sodium: 127 mmol/L — ABNORMAL LOW (ref 135–145)
Sodium: 127 mmol/L — ABNORMAL LOW (ref 135–145)
Total Bilirubin: 2 mg/dL — ABNORMAL HIGH (ref <1.2)
Total Bilirubin: 1.3 mg/dL — ABNORMAL HIGH (ref <1.2)
Total Protein: 6.1 g/dL — ABNORMAL LOW (ref 6.5–8.1)
Total Protein: 5.2 g/dL — ABNORMAL LOW (ref 6.5–8.1)

## 2023-03-09 LAB — RESP PANEL BY RT-PCR (RSV, FLU A&B, COVID)  RVPGX2
Influenza A by PCR: NEGATIVE
Influenza B by PCR: NEGATIVE
Resp Syncytial Virus by PCR: NEGATIVE
SARS Coronavirus 2 by RT PCR: NEGATIVE

## 2023-03-09 LAB — CBC
HCT: 46.8 % (ref 39.0–52.0)
Hemoglobin: 16.4 g/dL (ref 13.0–17.0)
MCH: 32.3 pg (ref 26.0–34.0)
MCHC: 35 g/dL (ref 30.0–36.0)
MCV: 92.3 fL (ref 80.0–100.0)
Platelets: 396 10*3/uL (ref 150–400)
RBC: 5.07 MIL/uL (ref 4.22–5.81)
RDW: 14.6 % (ref 11.5–15.5)
WBC: 13.4 10*3/uL — ABNORMAL HIGH (ref 4.0–10.5)
nRBC: 0 % (ref 0.0–0.2)

## 2023-03-09 LAB — TROPONIN I (HIGH SENSITIVITY)
Troponin I (High Sensitivity): 33 ng/L — ABNORMAL HIGH (ref <18)
Troponin I (High Sensitivity): 24 ng/L — ABNORMAL HIGH (ref <18)

## 2023-03-09 LAB — I-STAT CG4 LACTIC ACID, ED
Lactic Acid, Venous: 1.2 mmol/L (ref 0.5–1.9)
Lactic Acid, Venous: 1.4 mmol/L (ref 0.5–1.9)

## 2023-03-09 LAB — MAGNESIUM: Magnesium: 1.6 mg/dL — ABNORMAL LOW (ref 1.7–2.4)

## 2023-03-09 MED ORDER — ACETAMINOPHEN 500 MG PO TABS
1000.0000 mg | ORAL_TABLET | Freq: Once | ORAL | Status: AC
  Administered 2023-03-09: 1000 mg via ORAL
  Filled 2023-03-09: qty 2

## 2023-03-09 MED ORDER — OXYCODONE-ACETAMINOPHEN 5-325 MG PO TABS
1.0000 | ORAL_TABLET | Freq: Four times a day (QID) | ORAL | Status: DC | PRN
  Administered 2023-03-10: 1 via ORAL
  Administered 2023-03-11 – 2023-03-13 (×4): 2 via ORAL
  Administered 2023-03-14: 1 via ORAL
  Administered 2023-03-15 – 2023-03-16 (×3): 2 via ORAL
  Filled 2023-03-09 (×6): qty 2
  Filled 2023-03-09 (×2): qty 1
  Filled 2023-03-09: qty 2

## 2023-03-09 MED ORDER — ONDANSETRON HCL 4 MG/2ML IJ SOLN
4.0000 mg | Freq: Once | INTRAMUSCULAR | Status: AC
  Administered 2023-03-09: 4 mg via INTRAVENOUS
  Filled 2023-03-09: qty 2

## 2023-03-09 MED ORDER — LACTATED RINGERS IV BOLUS
1000.0000 mL | Freq: Once | INTRAVENOUS | Status: AC
  Administered 2023-03-09: 1000 mL via INTRAVENOUS

## 2023-03-09 MED ORDER — ONDANSETRON HCL 4 MG/2ML IJ SOLN: 4.0000 mg | Freq: Four times a day (QID) | INTRAMUSCULAR | Status: DC | PRN

## 2023-03-09 MED ORDER — ACETAMINOPHEN 325 MG PO TABS
650.0000 mg | ORAL_TABLET | Freq: Four times a day (QID) | ORAL | Status: DC | PRN
  Administered 2023-03-09 – 2023-03-15 (×3): 650 mg via ORAL
  Filled 2023-03-09 (×3): qty 2

## 2023-03-09 MED ORDER — FAMOTIDINE 20 MG PO TABS
20.0000 mg | ORAL_TABLET | Freq: Once | ORAL | Status: AC
  Administered 2023-03-09: 20 mg via ORAL
  Filled 2023-03-09: qty 1

## 2023-03-09 MED ORDER — INSULIN ASPART 100 UNIT/ML IJ SOLN
0.0000 [IU] | Freq: Three times a day (TID) | INTRAMUSCULAR | Status: DC
  Administered 2023-03-10: 1 [IU] via SUBCUTANEOUS
  Administered 2023-03-10: 2 [IU] via SUBCUTANEOUS
  Administered 2023-03-11: 3 [IU] via SUBCUTANEOUS
  Administered 2023-03-12 (×3): 2 [IU] via SUBCUTANEOUS
  Administered 2023-03-13: 1 [IU] via SUBCUTANEOUS
  Administered 2023-03-13 (×2): 2 [IU] via SUBCUTANEOUS
  Administered 2023-03-14: 5 [IU] via SUBCUTANEOUS
  Administered 2023-03-14 – 2023-03-15 (×3): 2 [IU] via SUBCUTANEOUS
  Administered 2023-03-15: 3 [IU] via SUBCUTANEOUS
  Administered 2023-03-16: 1 [IU] via SUBCUTANEOUS
  Administered 2023-03-16 – 2023-03-17 (×3): 2 [IU] via SUBCUTANEOUS
  Administered 2023-03-17: 3 [IU] via SUBCUTANEOUS

## 2023-03-09 MED ORDER — ENOXAPARIN SODIUM 30 MG/0.3ML IJ SOSY
30.0000 mg | PREFILLED_SYRINGE | INTRAMUSCULAR | Status: DC
  Administered 2023-03-10: 30 mg via SUBCUTANEOUS
  Filled 2023-03-09: qty 0.3

## 2023-03-09 MED ORDER — ALUM & MAG HYDROXIDE-SIMETH 200-200-20 MG/5ML PO SUSP
30.0000 mL | Freq: Once | ORAL | Status: AC
  Administered 2023-03-09: 30 mL via ORAL
  Filled 2023-03-09: qty 30

## 2023-03-09 NOTE — Assessment & Plan Note (Addendum)
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 H/o Lung cancer per PMH, but other than this 1.4cm pulm nodule, rads didn't really see anything of concern on CT AP today.

## 2023-03-09 NOTE — Assessment & Plan Note (Addendum)
Pt with h/o CAD, NSTEMI in May got PCI to RCA with DES at that time. Today has persistent S.Tach to 110-130 in ED. ? Demand ischemia given persistent S.Tach in ED ? DDx includes PE among other causes. CP free at this time. CP obs pathway 2nd trop pending Tele monitor 2d echo D.Dimer pending Probably needs VQ scan if positive, has AKI today Probably needs cards consult if we can't come up with a cause for his S.Tach overnight.  Will put in message to P. Trent for AM eval.

## 2023-03-09 NOTE — Assessment & Plan Note (Signed)
BPs actually on softer side in ED right now. Med rec pending Hold losartan due to AKI Cont metoprolol

## 2023-03-09 NOTE — Assessment & Plan Note (Signed)
Unclear cause. Cholelithiasis but no evidence of duct obstruction, acute cholecysitits on CT nor [place].  No RUQ abd pain. Repeat CMP in AM

## 2023-03-09 NOTE — Assessment & Plan Note (Addendum)
AKI on CKD: Creat 2.6 today up from 1.4 baseline. ? Pre-renal given the > 20:1 BUN:Cr ratio + N/V? Got 2L IVF bolus in ED Strict intake and output Repeat CMP in AM Hold any nephrotoxic home meds No findings on CT AP to suggest obstruction. Check CPK Check UA

## 2023-03-09 NOTE — ED Triage Notes (Signed)
Pt to the  ed from home via ems with a CC of chest pain starting around 1400 while he was watching TV. Pt relays some sob with dizziness. Pt relays he has some indigestion x 2 days. Pt denies loc, back  pain at this time.

## 2023-03-09 NOTE — ED Provider Notes (Signed)
[place] [facility] AT [facility] Provider Note   CSN: [id number] Arrival date & time: 03/09/23  1641     History  Chief Complaint  Patient presents with   Chest Pain    Martin Thomas is a 67 y.o. male.  Pt with hx cad, c/o heartburn/mid chest discomfort in the past couple days. Pt unsure if similar to prior cardiac chest pain. Occurs at rest. No associated sob, or diaphoresis. No fever or chills. +non productive cough. Is smoker. Denies sore throat or trouble swallowing. No specific known ill contacts. +few episodes nausea/vomiting in past day, not bloody or bilious. No abd pain or distension. Having normal bms.  Had been compliant w meds but has not had today. No leg pain or swelling. No pleuritic pain. No hemoptysis.   The history is provided by the patient, medical records and the EMS personnel.  Chest Pain Associated symptoms: cough, nausea and vomiting   Associated symptoms: no abdominal pain, no back pain, no fever, no headache, no numbness, no palpitations, no shortness of breath and no weakness        Home Medications Prior to Admission medications   Medication Sig Start Date End Date Taking? Authorizing Provider  albuterol (PROVENTIL HFA;VENTOLIN HFA) 108 (90 BASE) MCG/ACT inhaler Inhale 2 puffs into the lungs every 6 (six) hours as needed for shortness of breath.     [provider]  aspirin EC 81 MG EC tablet Take 1 tablet (81 mg total) by mouth daily. 03/25/12   Dwana Melena, PA-C  atorvastatin (LIPITOR) 80 MG tablet Take 1 tablet (80 mg total) by mouth daily. 07/24/22   Corky Crafts, MD  Blood Glucose Monitoring Suppl (BLOOD GLUCOSE MONITOR SYSTEM) w/Device KIT Use in the morning, at noon, and at bedtime. 07/23/22   Corky Crafts, MD  budesonide-formoterol [facility]) 160-4.5 MCG/ACT inhaler Inhale 2 puffs into the lungs 2 (two) times daily.    [provider]  colchicine 0.6 MG tablet Take 1 tablet (0.6 mg  total) by mouth 2 (two) times daily. 08/05/22   Wouk, Wilfred Curtis, MD  cyclobenzaprine (FLEXERIL) 10 MG tablet Take 10 mg by mouth at bedtime.    [provider]  fenofibrate (TRICOR) 145 MG tablet Take 145 mg by mouth daily.    [provider]  furosemide (LASIX) 40 MG tablet Take 1 tablet (40 mg total) by mouth daily as needed for fluid. 08/05/22 08/05/23  Wouk, Wilfred Curtis, MD  gabapentin (NEURONTIN) 300 MG capsule Take 300 mg by mouth 3 (three) times daily.    [provider]  Glucose Blood (BLOOD GLUCOSE TEST STRIPS) STRP Use in the morning, at noon, and at bedtime. 07/23/22   Corky Crafts, MD  HYDROcodone-acetaminophen (NORCO/VICODIN) 5-325 MG per tablet Take 1 tablet by mouth every 8 (eight) hours as needed. For pain    [provider]  insulin aspart (NOVOLOG) 100 UNIT/ML FlexPen Inject 12 Units into the skin 3 (three) times daily with meals. 07/23/22   Corky Crafts, MD  insulin glargine (LANTUS) 100 unit/mL SOPN Inject 40 Units into the skin daily. 07/23/22   Corky Crafts, MD  Insulin Pen Needle 32G X 4 MM MISC Use 4 (four) times daily -  before meals and at bedtime. 07/23/22   Corky Crafts, MD  losartan (COZAAR) 25 MG tablet Take 1 tablet (25 mg total) by mouth daily. 07/24/22   Corky Crafts, MD  magnesium oxide (MAG-OX) 400 MG tablet  Take 400 mg by mouth 2 (two) times daily.    [provider]  metoprolol succinate (TOPROL-XL) 50 MG 24 hr tablet Take 1 tablet (50 mg total) by mouth daily. Take with or immediately following a meal. 07/24/22   Corky Crafts, MD  nitroGLYCERIN (NITROSTAT) 0.4 MG SL tablet Place 1 tablet (0.4 mg total) under the tongue every 5 (five) minutes as needed for chest pain. 07/23/22   Perlie Gold, PA-C  omega-3 acid ethyl esters (LOVAZA) 1 G capsule Take 2 g by mouth 2 (two) times daily.    [provider]  omeprazole (PRILOSEC) 20 MG capsule Take 20 mg by mouth daily.     [provider]  senna (SENOKOT) 8.6 MG TABS Take 1 tablet by mouth daily.    [provider]  Study - EVOLVE-MI - evolocumab (REPATHA) 140 mg/mL SQ injection (PI-Stuckey) Inject 1 mL (140 mg total) into the skin every 14 (fourteen) days. For Investigational Use Only. Inject subcutaneously into abdomen, thigh, or upper arm every 14 days. Rotate injection sites and do not inject into areas where skin is tender, bruised, or red. Please contact [facility] for any questions or concerns regarding this medication. 07/23/22   Perlie Gold, PA-C  ticagrelor (BRILINTA) 90 MG TABS tablet Take 1 tablet (90 mg total) by mouth 2 (two) times daily. 07/23/22   Corky Crafts, MD  traZODone (DESYREL) 50 MG tablet Take 50 mg by mouth at bedtime.    [provider]      Allergies    Contrast media [iodinated contrast media], Iodine, Sulfamethoxazole-trimethoprim, Lisinopril, Varenicline, Ciprofloxacin, and Metformin    Review of Systems   Review of Systems  Constitutional:  Negative for chills and fever.  HENT:  Negative for sore throat.   Eyes:  Negative for redness.  Respiratory:  Positive for cough. Negative for shortness of breath.   Cardiovascular:  Positive for chest pain. Negative for palpitations and leg swelling.  Gastrointestinal:  Positive for nausea and vomiting. Negative for abdominal pain.  Genitourinary:  Negative for dysuria and flank pain.  Musculoskeletal:  Negative for back pain and neck pain.  Skin:  Negative for rash.  Neurological:  Negative for speech difficulty, weakness, numbness and headaches.  Hematological:  Does not bruise/bleed easily.  Psychiatric/Behavioral:  Negative for confusion.     Physical Exam Updated Vital Signs BP 112/70   Pulse (!) 113   Temp 98.1 F (36.7 C) (Oral)   Resp 14   Ht 1.753 m (5\' 9" )   Wt 84.4 kg   SpO2 97%   BMI 27.47 kg/m  Physical Exam Vitals and nursing note reviewed.  Constitutional:       Appearance: Normal appearance. He is well-developed.  HENT:     Head: Atraumatic.     Nose: Nose normal.     Mouth/Throat:     Mouth: Mucous membranes are moist.     Pharynx: Oropharynx is clear. No oropharyngeal exudate or posterior oropharyngeal erythema.  Eyes:     General: No scleral icterus.    Conjunctiva/sclera: Conjunctivae normal.     Pupils: Pupils are equal, round, and reactive to light.  Neck:     Trachea: No tracheal deviation.  Cardiovascular:     Rate and Rhythm: Regular rhythm. Tachycardia present.     Pulses: Normal pulses.     Heart sounds: Normal heart sounds. No murmur heard.    No friction rub. No gallop.  Pulmonary:     Effort: Pulmonary  effort is normal. No accessory muscle usage or respiratory distress.     Breath sounds: Normal breath sounds.  Chest:     Chest wall: No tenderness.  Abdominal:     General: Bowel sounds are normal. There is no distension.     Palpations: Abdomen is soft. There is no mass.     Tenderness: There is no abdominal tenderness. There is no guarding.  Genitourinary:    Comments: No cva tenderness. Musculoskeletal:        General: No swelling or tenderness.     Cervical back: Normal range of motion and neck supple. No rigidity.     Right lower leg: No edema.     Left lower leg: No edema.  Skin:    General: Skin is warm and dry.     Findings: No rash.  Neurological:     Mental Status: He is alert.     Comments: Alert, speech clear. Motor/sens grossly intact bil. Stre 5/5. Sens intact.   Psychiatric:        Mood and Affect: Mood normal.     ED Results / Procedures / Treatments   Labs (all labs ordered are listed, but only abnormal results are displayed) Results for orders placed or performed during the hospital encounter of 03/09/23  CBC   Collection Time: 03/09/23  2:22 PM  Result Value Ref Range   WBC 13.4 (H) 4.0 - 10.5 K/uL   RBC 5.07 4.22 - 5.81 MIL/uL   Hemoglobin 16.4 13.0 - 17.0 g/dL   HCT 82.9 56.2 - 13.0 %    MCV 92.3 80.0 - 100.0 fL   MCH 32.3 26.0 - 34.0 pg   MCHC 35.0 30.0 - 36.0 g/dL   RDW 86.5 78.4 - 69.6 %   Platelets 396 150 - 400 K/uL   nRBC 0.0 0.0 - 0.2 %  Comprehensive metabolic panel   Collection Time: 03/09/23  2:22 PM  Result Value Ref Range   Sodium 127 (L) 135 - 145 mmol/L   Potassium 3.9 3.5 - 5.1 mmol/L   Chloride 91 (L) 98 - 111 mmol/L   CO2 20 (L) 22 - 32 mmol/L   Glucose, Bld 169 (H) 70 - 99 mg/dL   BUN 73 (H) 8 - 23 mg/dL   Creatinine, Ser 2.95 (H) 0.61 - 1.24 mg/dL   Calcium 8.6 (L) 8.9 - 10.3 mg/dL   Total Protein 6.1 (L) 6.5 - 8.1 g/dL   Albumin 3.0 (L) 3.5 - 5.0 g/dL   AST 284 (H) 15 - 41 U/L   ALT 292 (H) 0 - 44 U/L   Alkaline Phosphatase 196 (H) 38 - 126 U/L   Total Bilirubin 2.0 (H) <1.2 mg/dL   GFR, Estimated 26 (L) >60 mL/min   Anion gap 16 (H) 5 - 15  Troponin I (High Sensitivity)   Collection Time: 03/09/23  2:22 PM  Result Value Ref Range   Troponin I (High Sensitivity) 33 (H) <18 ng/L  Resp panel by RT-PCR (RSV, Flu A&B, Covid) Anterior Nasal Swab   Collection Time: 03/09/23  5:21 PM   Specimen: Anterior Nasal Swab  Result Value Ref Range   SARS Coronavirus 2 by RT PCR NEGATIVE NEGATIVE   Influenza A by PCR NEGATIVE NEGATIVE   Influenza B by PCR NEGATIVE NEGATIVE   Resp Syncytial Virus by PCR NEGATIVE NEGATIVE  I-Stat CG4 Lactic Acid   Collection Time: 03/09/23  8:18 PM  Result Value Ref Range   Lactic Acid, Venous 1.2 0.5 -  1.9 mmol/L    EKG EKG Interpretation Date/Time:  Sunday March 09 2023 16:57:34 EST Ventricular Rate:  141 PR Interval:  61 QRS Duration:  99 QT Interval:  331 QTC Calculation: 507 R Axis:   89  Text Interpretation: Narrow QRS tachycardia Non-specific ST-t changes Confirmed by Cathren Laine ([id number]) on 03/09/2023 5:06:54 PM  Radiology DG Chest Port 1 View Result Date: 03/09/2023 CLINICAL DATA:  Pain.  Chest pain and shortness of breath. EXAM: PORTABLE CHEST 1 VIEW COMPARISON:  08/04/2022 FINDINGS: Normal  heart size and pulmonary vascularity. No focal airspace disease or consolidation in the lungs. No blunting of costophrenic angles. No pneumothorax. Mediastinal contours appear intact. Calcification of the aorta. IMPRESSION: No active disease. Electronically Signed   By: Burman Nieves M.D.   On: 03/09/2023 18:52    Procedures Procedures    Medications Ordered in ED Medications  famotidine (PEPCID) tablet 20 mg (20 mg Oral Given 03/09/23 1803)  alum & mag hydroxide-simeth (MAALOX/MYLANTA) 200-200-20 MG/5ML suspension 30 mL (30 mLs Oral Given 03/09/23 1757)  acetaminophen (TYLENOL) tablet 1,000 mg (1,000 mg Oral Given 03/09/23 1803)  lactated ringers bolus 1,000 mL (0 mLs Intravenous Stopped 03/09/23 1948)  ondansetron (ZOFRAN) injection 4 mg (4 mg Intravenous Given 03/09/23 1757)  lactated ringers bolus 1,000 mL (1,000 mLs Intravenous New Bag/Given 03/09/23 1948)    ED Course/ Medical Decision Making/ A&P                                 Medical Decision Making Problems Addressed: AKI (acute kidney injury) [facility]): acute illness or injury with systemic symptoms that poses a threat to life or bodily functions Dehydration: acute illness or injury with systemic symptoms that poses a threat to life or bodily functions Elevated liver function tests: acute illness or injury Hyponatremia: acute illness or injury with systemic symptoms that poses a threat to life or bodily functions Nausea and vomiting in adult: acute illness or injury with systemic symptoms that poses a threat to life or bodily functions Precordial chest pain: acute illness or injury with systemic symptoms that poses a threat to life or bodily functions Stage 3b chronic kidney disease (HCC): chronic illness or injury  Amount and/or Complexity of Data Reviewed External Data Reviewed: labs and notes. Labs: ordered. Decision-making details documented in ED Course. Radiology: ordered and independent interpretation performed.  Decision-making details documented in ED Course. ECG/medicine tests: ordered and independent interpretation performed. Decision-making details documented in ED Course.  Risk OTC drugs. Prescription drug management. Decision regarding hospitalization.   Iv ns. Continuous pulse ox and cardiac monitoring. Labs ordered/sent. Imaging ordered.   Differential diagnosis includes acs, msk cp, gi cp, nv illness, dehydration, etc. Dispo decision including potential need for admission considered - will get labs and imaging and reassess.   Reviewed nursing notes and prior charts for additional history. External reports reviewed. Additional history from: EMS.   LR bolus. Zofran iv.   Cardiac monitor: sinus rhythm, rate 113.  Initial hr/ecg 130s. Recheck on monitor, hr 110-115, appears sinus tachycardia.   Labs reviewed/interpreted by me - aki on ckd. LR bolus as recent nv, ? Volume depletion. K normal. Trop sl elevated ?demand vs ischemia. No current chest pain. Na is low. NS bolus. Wbc mildly high. Lactate normal. Cxs sent.   Xrays reviewed/interpreted by me - no pna.   CT reviewed/interpreted by me - pnd.   Given chest pain, tachycardia, hyponatremia, recent nv  and dehydration/aki, elevated lfts, will plan for admission.   Ct abd is pending given nv and elev lfts, although no current abd pain or tenderness on exam.   Discussed w hospitalistDr Julian Reil, - will admit.            Final Clinical Impression(s) / ED Diagnoses Final diagnoses:  Precordial chest pain  Nausea and vomiting in adult  Elevated liver function tests  AKI (acute kidney injury) (HCC)  Stage 3b chronic kidney disease (HCC)  Hyponatremia  Dehydration    Rx / DC Orders ED Discharge Orders     None         Cathren Laine, MD 03/09/23 2035

## 2023-03-09 NOTE — Assessment & Plan Note (Signed)
Med rec pending at this time. Doesn't seem to be grossly volume overloaded on exam, labs, or imaging at this point. Getting updated 2d echo as noted under CP above

## 2023-03-09 NOTE — Assessment & Plan Note (Signed)
Med rec pending Sensitive SSI AC for the moment

## 2023-03-09 NOTE — Assessment & Plan Note (Signed)
Med rec pending Looks like he's on repatha ? Statin as well Cont statin if still on this med

## 2023-03-10 ENCOUNTER — Observation Stay (HOSPITAL_COMMUNITY): Payer: No Typology Code available for payment source

## 2023-03-10 ENCOUNTER — Encounter (HOSPITAL_COMMUNITY): Payer: Self-pay | Admitting: Internal Medicine

## 2023-03-10 ENCOUNTER — Encounter: Payer: Self-pay | Admitting: *Deleted

## 2023-03-10 DIAGNOSIS — K760 Fatty (change of) liver, not elsewhere classified: Secondary | ICD-10-CM | POA: Diagnosis present

## 2023-03-10 DIAGNOSIS — K220 Achalasia of cardia: Secondary | ICD-10-CM | POA: Diagnosis present

## 2023-03-10 DIAGNOSIS — R072 Precordial pain: Secondary | ICD-10-CM

## 2023-03-10 DIAGNOSIS — R079 Chest pain, unspecified: Secondary | ICD-10-CM | POA: Diagnosis not present

## 2023-03-10 DIAGNOSIS — R112 Nausea with vomiting, unspecified: Secondary | ICD-10-CM

## 2023-03-10 DIAGNOSIS — K8020 Calculus of gallbladder without cholecystitis without obstruction: Secondary | ICD-10-CM

## 2023-03-10 DIAGNOSIS — Z006 Encounter for examination for normal comparison and control in clinical research program: Secondary | ICD-10-CM

## 2023-03-10 DIAGNOSIS — R000 Tachycardia, unspecified: Secondary | ICD-10-CM | POA: Diagnosis not present

## 2023-03-10 DIAGNOSIS — Z89612 Acquired absence of left leg above knee: Secondary | ICD-10-CM | POA: Diagnosis not present

## 2023-03-10 DIAGNOSIS — Z794 Long term (current) use of insulin: Secondary | ICD-10-CM | POA: Diagnosis not present

## 2023-03-10 DIAGNOSIS — I2510 Atherosclerotic heart disease of native coronary artery without angina pectoris: Secondary | ICD-10-CM

## 2023-03-10 DIAGNOSIS — R911 Solitary pulmonary nodule: Secondary | ICD-10-CM

## 2023-03-10 DIAGNOSIS — N179 Acute kidney failure, unspecified: Secondary | ICD-10-CM | POA: Diagnosis present

## 2023-03-10 DIAGNOSIS — K719 Toxic liver disease, unspecified: Secondary | ICD-10-CM | POA: Diagnosis present

## 2023-03-10 DIAGNOSIS — D631 Anemia in chronic kidney disease: Secondary | ICD-10-CM | POA: Diagnosis present

## 2023-03-10 DIAGNOSIS — E1151 Type 2 diabetes mellitus with diabetic peripheral angiopathy without gangrene: Secondary | ICD-10-CM | POA: Diagnosis present

## 2023-03-10 DIAGNOSIS — I4719 Other supraventricular tachycardia: Secondary | ICD-10-CM | POA: Diagnosis present

## 2023-03-10 DIAGNOSIS — I5022 Chronic systolic (congestive) heart failure: Secondary | ICD-10-CM | POA: Diagnosis present

## 2023-03-10 DIAGNOSIS — I2489 Other forms of acute ischemic heart disease: Secondary | ICD-10-CM | POA: Diagnosis present

## 2023-03-10 DIAGNOSIS — E871 Hypo-osmolality and hyponatremia: Secondary | ICD-10-CM | POA: Diagnosis present

## 2023-03-10 DIAGNOSIS — N1832 Chronic kidney disease, stage 3b: Secondary | ICD-10-CM | POA: Diagnosis present

## 2023-03-10 DIAGNOSIS — R7401 Elevation of levels of liver transaminase levels: Secondary | ICD-10-CM | POA: Diagnosis present

## 2023-03-10 DIAGNOSIS — I2699 Other pulmonary embolism without acute cor pulmonale: Secondary | ICD-10-CM

## 2023-03-10 DIAGNOSIS — E7800 Pure hypercholesterolemia, unspecified: Secondary | ICD-10-CM | POA: Diagnosis present

## 2023-03-10 DIAGNOSIS — J449 Chronic obstructive pulmonary disease, unspecified: Secondary | ICD-10-CM | POA: Diagnosis present

## 2023-03-10 DIAGNOSIS — I130 Hypertensive heart and chronic kidney disease with heart failure and stage 1 through stage 4 chronic kidney disease, or unspecified chronic kidney disease: Secondary | ICD-10-CM | POA: Diagnosis present

## 2023-03-10 DIAGNOSIS — E860 Dehydration: Secondary | ICD-10-CM | POA: Diagnosis present

## 2023-03-10 DIAGNOSIS — I10 Essential (primary) hypertension: Secondary | ICD-10-CM

## 2023-03-10 DIAGNOSIS — I519 Heart disease, unspecified: Secondary | ICD-10-CM | POA: Diagnosis not present

## 2023-03-10 DIAGNOSIS — E1122 Type 2 diabetes mellitus with diabetic chronic kidney disease: Secondary | ICD-10-CM | POA: Diagnosis present

## 2023-03-10 DIAGNOSIS — I2583 Coronary atherosclerosis due to lipid rich plaque: Secondary | ICD-10-CM

## 2023-03-10 DIAGNOSIS — Z23 Encounter for immunization: Secondary | ICD-10-CM | POA: Diagnosis not present

## 2023-03-10 DIAGNOSIS — Z1152 Encounter for screening for COVID-19: Secondary | ICD-10-CM | POA: Diagnosis not present

## 2023-03-10 DIAGNOSIS — F17210 Nicotine dependence, cigarettes, uncomplicated: Secondary | ICD-10-CM | POA: Diagnosis present

## 2023-03-10 DIAGNOSIS — I951 Orthostatic hypotension: Secondary | ICD-10-CM

## 2023-03-10 DIAGNOSIS — E1165 Type 2 diabetes mellitus with hyperglycemia: Secondary | ICD-10-CM | POA: Diagnosis present

## 2023-03-10 DIAGNOSIS — R7989 Other specified abnormal findings of blood chemistry: Secondary | ICD-10-CM | POA: Diagnosis not present

## 2023-03-10 DIAGNOSIS — Z79899 Other long term (current) drug therapy: Secondary | ICD-10-CM | POA: Diagnosis not present

## 2023-03-10 DIAGNOSIS — N1831 Chronic kidney disease, stage 3a: Secondary | ICD-10-CM | POA: Diagnosis not present

## 2023-03-10 LAB — URINALYSIS, ROUTINE W REFLEX MICROSCOPIC
Bacteria, UA: NONE SEEN
Bilirubin Urine: NEGATIVE
Glucose, UA: NEGATIVE mg/dL
Hgb urine dipstick: NEGATIVE
Ketones, ur: NEGATIVE mg/dL
Leukocytes,Ua: NEGATIVE
Nitrite: NEGATIVE
Protein, ur: >=300 mg/dL — AB
Specific Gravity, Urine: 1.023 (ref 1.005–1.030)
pH: 5 (ref 5.0–8.0)

## 2023-03-10 LAB — COMPREHENSIVE METABOLIC PANEL
ALT: 342 U/L — ABNORMAL HIGH (ref 0–44)
AST: 377 U/L — ABNORMAL HIGH (ref 15–41)
Albumin: 2.5 g/dL — ABNORMAL LOW (ref 3.5–5.0)
Alkaline Phosphatase: 154 U/L — ABNORMAL HIGH (ref 38–126)
Anion gap: 11 (ref 5–15)
BUN: 60 mg/dL — ABNORMAL HIGH (ref 8–23)
CO2: 21 mmol/L — ABNORMAL LOW (ref 22–32)
Calcium: 7.9 mg/dL — ABNORMAL LOW (ref 8.9–10.3)
Chloride: 97 mmol/L — ABNORMAL LOW (ref 98–111)
Creatinine, Ser: 2.25 mg/dL — ABNORMAL HIGH (ref 0.61–1.24)
GFR, Estimated: 31 mL/min — ABNORMAL LOW (ref >60)
Glucose, Bld: 93 mg/dL (ref 70–99)
Potassium: 3.7 mmol/L (ref 3.5–5.1)
Sodium: 129 mmol/L — ABNORMAL LOW (ref 135–145)
Total Bilirubin: 1.9 mg/dL — ABNORMAL HIGH (ref <1.2)
Total Protein: 5.3 g/dL — ABNORMAL LOW (ref 6.5–8.1)

## 2023-03-10 LAB — CBG MONITORING, ED
Glucose-Capillary: 94 mg/dL (ref 70–99)
Glucose-Capillary: 191 mg/dL — ABNORMAL HIGH (ref 70–99)
Glucose-Capillary: 150 mg/dL — ABNORMAL HIGH (ref 70–99)

## 2023-03-10 LAB — ECHOCARDIOGRAM COMPLETE
AV Peak grad: 15.7 mm[Hg]
Ao pk vel: 1.98 m/s
Area-P 1/2: 5.13 cm2
Height: 69 in
P 1/2 time: 323 ms
S' Lateral: 3.7 cm
Weight: 2976 [oz_av]

## 2023-03-10 LAB — OSMOLALITY: Osmolality: 300 mosm/kg — ABNORMAL HIGH (ref 275–295)

## 2023-03-10 LAB — PROTIME-INR
INR: 1 (ref 0.8–1.2)
Prothrombin Time: 13.5 s (ref 11.4–15.2)

## 2023-03-10 LAB — OSMOLALITY, URINE: Osmolality, Ur: 452 mosm/kg (ref 300–900)

## 2023-03-10 LAB — GAMMA GT: GGT: 328 U/L — ABNORMAL HIGH (ref 7–50)

## 2023-03-10 LAB — HEPATITIS C ANTIBODY: HCV Ab: NONREACTIVE

## 2023-03-10 LAB — HEPATITIS B CORE ANTIBODY, TOTAL: Hep B Core Total Ab: NONREACTIVE

## 2023-03-10 LAB — GLUCOSE, CAPILLARY: Glucose-Capillary: 219 mg/dL — ABNORMAL HIGH (ref 70–99)

## 2023-03-10 LAB — HEPATITIS A ANTIBODY, TOTAL: hep A Total Ab: REACTIVE — AB

## 2023-03-10 LAB — D-DIMER, QUANTITATIVE: D-Dimer, Quant: 0.84 ug{FEU}/mL — ABNORMAL HIGH (ref 0.00–0.50)

## 2023-03-10 LAB — CK: Total CK: 44 U/L — ABNORMAL LOW (ref 49–397)

## 2023-03-10 LAB — HEPATITIS B SURFACE ANTIGEN: Hepatitis B Surface Ag: NONREACTIVE

## 2023-03-10 LAB — HEMOGLOBIN A1C
Hgb A1c MFr Bld: 8.2 % — ABNORMAL HIGH (ref 4.8–5.6)
Mean Plasma Glucose: 188.64 mg/dL

## 2023-03-10 LAB — HEPARIN LEVEL (UNFRACTIONATED): Heparin Unfractionated: >1.1 [IU]/mL — ABNORMAL HIGH (ref 0.30–0.70)

## 2023-03-10 LAB — SODIUM, URINE, RANDOM: Sodium, Ur: 19 mmol/L

## 2023-03-10 LAB — HEPATITIS A ANTIBODY, IGM: Hep A IgM: NONREACTIVE

## 2023-03-10 MED ORDER — TECHNETIUM TO 99M ALBUMIN AGGREGATED
4.0000 | Freq: Once | INTRAVENOUS | Status: AC | PRN
Start: 2023-03-10 — End: 2023-03-10
  Administered 2023-03-10: 4 via INTRAVENOUS

## 2023-03-10 MED ORDER — HEPARIN (PORCINE) 25000 UT/250ML-% IV SOLN
1450.0000 [IU]/h | INTRAVENOUS | Status: DC
  Administered 2023-03-10: 1450 [IU]/h via INTRAVENOUS
  Filled 2023-03-10: qty 250

## 2023-03-10 MED ORDER — TICAGRELOR 90 MG PO TABS
90.0000 mg | ORAL_TABLET | Freq: Two times a day (BID) | ORAL | Status: DC
  Administered 2023-03-10 – 2023-03-17 (×15): 90 mg via ORAL
  Filled 2023-03-10 (×15): qty 1

## 2023-03-10 MED ORDER — HEPARIN (PORCINE) 25000 UT/250ML-% IV SOLN
1600.0000 [IU]/h | INTRAVENOUS | Status: DC
  Administered 2023-03-11: 1300 [IU]/h via INTRAVENOUS
  Filled 2023-03-10 (×3): qty 250

## 2023-03-10 MED ORDER — INFLUENZA VAC A&B SURF ANT ADJ 0.5 ML IM SUSY
0.5000 mL | PREFILLED_SYRINGE | INTRAMUSCULAR | Status: AC
  Administered 2023-03-13: 0.5 mL via INTRAMUSCULAR
  Filled 2023-03-10: qty 0.5

## 2023-03-10 MED ORDER — HEPARIN BOLUS VIA INFUSION
3000.0000 [IU] | Freq: Once | INTRAVENOUS | Status: AC
  Administered 2023-03-10: 3000 [IU] via INTRAVENOUS
  Filled 2023-03-10: qty 3000

## 2023-03-10 MED ORDER — ASPIRIN 81 MG PO TBEC
81.0000 mg | DELAYED_RELEASE_TABLET | Freq: Every day | ORAL | Status: DC
  Administered 2023-03-10 – 2023-03-11 (×2): 81 mg via ORAL
  Filled 2023-03-10 (×2): qty 1

## 2023-03-10 MED ORDER — MIRTAZAPINE 15 MG PO TABS
30.0000 mg | ORAL_TABLET | Freq: Every day | ORAL | Status: DC
  Administered 2023-03-10 – 2023-03-16 (×7): 30 mg via ORAL
  Filled 2023-03-10 (×7): qty 2

## 2023-03-10 MED ORDER — METOPROLOL SUCCINATE ER 50 MG PO TB24
50.0000 mg | ORAL_TABLET | Freq: Every day | ORAL | Status: DC
  Administered 2023-03-10 – 2023-03-17 (×8): 50 mg via ORAL
  Filled 2023-03-10: qty 1
  Filled 2023-03-10: qty 2
  Filled 2023-03-10 (×6): qty 1

## 2023-03-10 NOTE — H&P (Addendum)
History and Physical    Patient: Martin Thomas ZOX:[id number] DOB: 28-May-1955 DOA: 03/09/2023 DOS: the patient was seen and examined on 03/10/2023 PCP: Default, Provider, MD  Patient coming from: Home  Chief Complaint:  Chief Complaint  Patient presents with   Chest Pain   HPI: Martin Thomas is a 67 y.o. male with medical history significant of lung CA, DM2, HTN, HLD, CAD with NSTEMI in May of this year, got PCI with DES to RCA at that time.  Pt in to ED today with c/o CP, N/V.  Having chest discomfort intermittently for past couple of days.  No associated SOB, no fevers nor chills.  Does have non productive cough.  No specific known ill contacts. +few episodes nausea/vomiting in past day, not bloody or bilious. No abd pain or distension. Having normal bms. Had been compliant w meds but has not had today. No leg pain or swelling. No pleuritic pain. No hemoptysis.   Review of Systems: As mentioned in the history of present illness. All other systems reviewed and are negative. Past Medical History:  Diagnosis Date   Cancer (HCC)    lung cancer   COPD (chronic obstructive pulmonary disease) (HCC)    Diabetes mellitus without complication (HCC)    Hyperlipemia    Hypertension    Past Surgical History:  Procedure Laterality Date   APPENDECTOMY     BELOW KNEE LEG AMPUTATION  2009   left   CORONARY STENT INTERVENTION N/A 07/19/2022   Procedure: CORONARY STENT INTERVENTION;  Surgeon: Corky Crafts, MD;  Location: [facility] INVASIVE CV LAB;  Service: Cardiovascular;  Laterality: N/A;  RCA   CORONARY STENT PLACEMENT     CORONARY THROMBECTOMY N/A 07/19/2022   Procedure: Coronary Thrombectomy;  Surgeon: Corky Crafts, MD;  Location: [facility] INVASIVE CV LAB;  Service: Cardiovascular;  Laterality: N/A;  RCA   LEFT HEART CATH AND CORONARY ANGIOGRAPHY N/A 07/19/2022   Procedure: LEFT HEART CATH AND CORONARY ANGIOGRAPHY;  Surgeon: Corky Crafts, MD;  Location: [facility] INVASIVE CV LAB;   Service: Cardiovascular;  Laterality: N/A;   LEFT HEART CATHETERIZATION WITH CORONARY ANGIOGRAM N/A 03/24/2012   Procedure: LEFT HEART CATHETERIZATION WITH CORONARY ANGIOGRAM;  Surgeon: Marykay Lex, MD;  Location: [facility] CATH LAB;  Service: Cardiovascular;  Laterality: N/A;   PERCUTANEOUS CORONARY STENT INTERVENTION (PCI-S)  03/24/2012   Procedure: PERCUTANEOUS CORONARY STENT INTERVENTION (PCI-S);  Surgeon: Marykay Lex, MD;  Location: [facility] CATH LAB;  Service: Cardiovascular;;   Social History:  reports that he has been smoking cigarettes. He does not have any smokeless tobacco history on file. He reports that he does not drink alcohol and does not use drugs.  Allergies  Allergen Reactions   Contrast Media [Iodinated Contrast Media] Anaphylaxis   Iodine Anaphylaxis   Sulfamethoxazole-Trimethoprim Nausea And Vomiting and Swelling    Other Reaction(s): Renal failure syndrome   Lisinopril Other (See Comments)    hyperkalemia  Other Reaction(s): Hyperkalemia   Varenicline     Other Reaction(s): Altered mental status   Ciprofloxacin Other (See Comments)    unknown   Metformin Other (See Comments)    Increased lactic acid  Other Reaction(s): Increased lactic acid level    Family History  Problem Relation Age of Onset   CAD Other    Heart attack Father        died of second MI at 32   Cancer Father    Heart attack Mother    Heart attack Paternal Uncle 79  died of MI at 94   Diabetes Brother    Stroke Maternal Grandfather     Prior to Admission medications   Medication Sig Start Date End Date Taking? Authorizing Provider  albuterol (PROVENTIL HFA;VENTOLIN HFA) 108 (90 BASE) MCG/ACT inhaler Inhale 2 puffs into the lungs every 6 (six) hours as needed for shortness of breath.    Yes [provider]  aspirin EC 81 MG EC tablet Take 1 tablet (81 mg total) by mouth daily. 03/25/12  Yes Dwana Melena, PA-C  atorvastatin (LIPITOR) 80 MG tablet Take 1 tablet (80 mg total) by  mouth daily. Patient taking differently: Take 40 mg by mouth at bedtime. 07/24/22  Yes Corky Crafts, MD  colchicine 0.6 MG tablet Take 1 tablet (0.6 mg total) by mouth 2 (two) times daily. 08/05/22  Yes Wouk, Wilfred Curtis, MD  cyclobenzaprine (FLEXERIL) 10 MG tablet Take 10 mg by mouth at bedtime.   Yes [provider]  empagliflozin (JARDIANCE) 25 MG TABS tablet Take 25 mg by mouth daily. 11/05/22  Yes [provider]  furosemide (LASIX) 40 MG tablet Take 1 tablet (40 mg total) by mouth daily as needed for fluid. Patient taking differently: Take 40 mg by mouth daily. 08/05/22 08/05/23 Yes Wouk, Wilfred Curtis, MD  gabapentin (NEURONTIN) 300 MG capsule Take 300 mg by mouth 2 (two) times daily.   Yes [provider]  HYDROcodone-acetaminophen (NORCO/VICODIN) 5-325 MG per tablet Take 1 tablet by mouth every 8 (eight) hours as needed. For pain   Yes [provider]  insulin aspart (NOVOLOG) 100 UNIT/ML FlexPen Inject 12 Units into the skin 3 (three) times daily with meals. 07/23/22  Yes Corky Crafts, MD  losartan (COZAAR) 25 MG tablet Take 1 tablet (25 mg total) by mouth daily. 07/24/22  Yes Corky Crafts, MD  magnesium oxide (MAG-OX) 400 (240 Mg) MG tablet Take 400 mg by mouth daily.   Yes [provider]  metoprolol succinate (TOPROL-XL) 50 MG 24 hr tablet Take 1 tablet (50 mg total) by mouth daily. Take with or immediately following a meal. 07/24/22  Yes Corky Crafts, MD  mirtazapine (REMERON) 30 MG tablet Take 30 mg by mouth at bedtime. 07/29/22  Yes [provider]  nitroGLYCERIN (NITROSTAT) 0.4 MG SL tablet Place 1 tablet (0.4 mg total) under the tongue every 5 (five) minutes as needed for chest pain. 07/23/22  Yes Perlie Gold, PA-C  omeprazole (PRILOSEC) 40 MG capsule Take 40 mg by mouth daily. 08/31/22  Yes [provider]  ticagrelor (BRILINTA) 90 MG TABS tablet Take 1 tablet (90 mg total) by mouth 2 (two) times  daily. 07/23/22  Yes Corky Crafts, MD  Blood Glucose Monitoring Suppl (BLOOD GLUCOSE MONITOR SYSTEM) w/Device KIT Use in the morning, at noon, and at bedtime. 07/23/22   Corky Crafts, MD  budesonide-formoterol [facility]) 160-4.5 MCG/ACT inhaler Inhale 2 puffs into the lungs 2 (two) times daily. Patient not taking: Reported on 03/09/2023    [provider]  Glucose Blood (BLOOD GLUCOSE TEST STRIPS) STRP Use in the morning, at noon, and at bedtime. 07/23/22   Corky Crafts, MD  insulin glargine-yfgn (SEMGLEE) 100 UNIT/ML Pen Inject 32 Units into the skin at bedtime. Patient not taking: Reported on 03/10/2023 10/17/22   [provider]  Insulin Pen Needle 32G X 4 MM MISC Use 4 (four) times daily -  before meals and at bedtime. 07/23/22   Corky Crafts, MD  nicotine polacrilex (  COMMIT) 4 MG lozenge Take 4 mg by mouth as needed for smoking cessation. Patient not taking: Reported on 03/10/2023 08/31/22   [provider]  nicotine polacrilex (NICORETTE) 4 MG gum Take 4 mg by mouth as needed for smoking cessation. Patient not taking: Reported on 03/10/2023 08/31/22   [provider]  Study - EVOLVE-MI - evolocumab (REPATHA) 140 mg/mL SQ injection (PI-Stuckey) Inject 1 mL (140 mg total) into the skin every 14 (fourteen) days. For Investigational Use Only. Inject subcutaneously into abdomen, thigh, or upper arm every 14 days. Rotate injection sites and do not inject into areas where skin is tender, bruised, or red. Please contact [facility] for any questions or concerns regarding this medication. Patient not taking: Reported on 03/10/2023 07/23/22   Perlie Gold, PA-C    Physical Exam: Vitals:   03/09/23 1845 03/09/23 2017 03/09/23 2252 03/09/23 2354  BP: (!) 108/50 112/70  103/67  Pulse: (!) 132 (!) 113  (!) 125  Resp: (!) 24 14  18   Temp:   97.9 F (36.6 C) 97.8 F (36.6 C)  TempSrc:   Oral   SpO2: 100% 97%  98%  Weight:      Height:        Constitutional: NAD, calm, comfortable Respiratory: clear to auscultation bilaterally, no wheezing, no crackles. Normal respiratory effort. No accessory muscle use.  Cardiovascular: Tachycardic Abdomen: no tenderness, no masses palpated. No hepatosplenomegaly. Bowel sounds positive.  Musculoskeletal: s/p L BKA. Skin: No ulcer to R foot Neurologic: CN 2-12 grossly intact. Sensation intact, DTR normal. Strength 5/5 in all 4.  Psychiatric: Normal judgment and insight. Alert and oriented x 3. Normal mood.   Data Reviewed:    Labs on Admission: I have personally reviewed following labs and imaging studies  CBC: Recent Labs  Lab 03/09/23 1422  WBC 13.4*  HGB 16.4  HCT 46.8  MCV 92.3  PLT 396   Basic Metabolic Panel: Recent Labs  Lab 03/09/23 1422 03/09/23 2311  NA 127* 127*  K 3.9 3.6  CL 91* 95*  CO2 20* 20*  GLUCOSE 169* 116*  BUN 73* 67*  CREATININE 2.65* 2.42*  CALCIUM 8.6* 7.6*  MG  --  1.6*   GFR: Estimated Creatinine Clearance: 30 mL/min (A) (by C-G formula based on SCr of 2.42 mg/dL (H)). Liver Function Tests: Recent Labs  Lab 03/09/23 1422 03/09/23 2311  AST 246* 235*  ALT 292* 250*  ALKPHOS 196* 155*  BILITOT 2.0* 1.3*  PROT 6.1* 5.2*  ALBUMIN 3.0* 2.4*   No results for input(s): "LIPASE", "AMYLASE" in the last 168 hours. No results for input(s): "AMMONIA" in the last 168 hours. Coagulation Profile: No results for input(s): "INR", "PROTIME" in the last 168 hours. Cardiac Enzymes: No results for input(s): "CKTOTAL", "CKMB", "CKMBINDEX", "TROPONINI" in the last 168 hours. BNP (last 3 results) No results for input(s): "PROBNP" in the last 8760 hours. HbA1C: No results for input(s): "HGBA1C" in the last 72 hours. CBG: No results for input(s): "GLUCAP" in the last 168 hours. Lipid Profile: No results for input(s): "CHOL", "HDL", "LDLCALC", "TRIG", "CHOLHDL", "LDLDIRECT" in the last 72 hours. Thyroid Function Tests: No results for input(s):  "TSH", "T4TOTAL", "FREET4", "T3FREE", "THYROIDAB" in the last 72 hours. Anemia Panel: No results for input(s): "VITAMINB12", "FOLATE", "FERRITIN", "TIBC", "IRON", "RETICCTPCT" in the last 72 hours. Urine analysis: No results found for: "COLORURINE", "APPEARANCEUR", "LABSPEC", "PHURINE", "GLUCOSEU", "HGBUR", "BILIRUBINUR", "KETONESUR", "PROTEINUR", "UROBILINOGEN", "NITRITE", "LEUKOCYTESUR"  Radiological Exams on Admission: US Abdomen Limited RUQ (LIVER/GB) Result  Date: 03/09/2023 CLINICAL DATA:  Transaminitis EXAM: ULTRASOUND ABDOMEN LIMITED RIGHT UPPER QUADRANT COMPARISON:  CT today FINDINGS: Gallbladder: Small layering stones. No wall thickening or sonographic Murphy sign. Common bile duct: Diameter: Normal caliber, 5-6 mm. Liver: No focal lesion identified. Within normal limits in parenchymal echogenicity. Portal vein is patent on color Doppler imaging with normal direction of blood flow towards the liver. Other: None. IMPRESSION: Cholelithiasis.  No sonographic evidence of acute cholecystitis. Electronically Signed   By: Charlett Nose M.D.   On: 03/09/2023 22:22   CT ABDOMEN PELVIS WO CONTRAST Result Date: 03/09/2023 CLINICAL DATA:  Abdominal pain, vomiting, elevated LFTs. EXAM: CT ABDOMEN AND PELVIS WITHOUT CONTRAST TECHNIQUE: Multidetector CT imaging of the abdomen and pelvis was performed following the standard protocol without IV contrast. RADIATION DOSE REDUCTION: This exam was performed according to the departmental dose-optimization program which includes automated exposure control, adjustment of the mA and/or kV according to patient size and/or use of iterative reconstruction technique. COMPARISON:  None Available. FINDINGS: Lower chest: Calcifications crash that extensive calcifications throughout the visualized right and left circumflex coronary arteries. Right lower lobe pulmonary nodule measures 1.4 cm on image 26. this compares to 1.7 cm on prior PET CT from 11/28/2022. No effusions.  Hepatobiliary: Small layering gallstones within the gallbladder. No focal hepatic abnormality or biliary ductal dilatation. Pancreas: No focal abnormality or ductal dilatation. Spleen: No focal abnormality.  Normal size. Adrenals/Urinary Tract: No adrenal abnormality. No focal renal abnormality. No stones or hydronephrosis. Urinary bladder is unremarkable. Stomach/Bowel: Sigmoid diverticulosis. No active diverticulitis. Stomach and small bowel decompressed, unremarkable. Vascular/Lymphatic: Diffuse aortoiliac atherosclerosis. No evidence of aneurysm or adenopathy. Reproductive: No visible focal abnormality. Other: No free fluid or free air. Musculoskeletal: No acute bony abnormality. IMPRESSION: 1.4 cm right lower lobe pulmonary nodule, stable or slightly decreased in size since prior PET CT. Small layering gallstones. No evidence of acute cholecystitis or biliary ductal dilatation. Coronary artery disease, aortoiliac atherosclerosis. Sigmoid diverticulosis. No acute findings. Electronically Signed   By: Charlett Nose M.D.   On: 03/09/2023 20:59   DG Chest Port 1 View Result Date: 03/09/2023 CLINICAL DATA:  Pain.  Chest pain and shortness of breath. EXAM: PORTABLE CHEST 1 VIEW COMPARISON:  08/04/2022 FINDINGS: Normal heart size and pulmonary vascularity. No focal airspace disease or consolidation in the lungs. No blunting of costophrenic angles. No pneumothorax. Mediastinal contours appear intact. Calcification of the aorta. IMPRESSION: No active disease. Electronically Signed   By: Burman Nieves M.D.   On: 03/09/2023 18:52    EKG: Independently reviewed.  Appears to shows S. Tach.  Rate on monitor is variable between ~115-140 at times, but rhythm is regular (doesn't seem to be A.Fib)  Rhythm strip below:    Assessment and Plan: * Chest pain Pt with h/o CAD, NSTEMI in May got PCI to RCA with DES at that time. Today has persistent S.Tach to 110-130 in ED. ? Demand ischemia given persistent S.Tach in  ED ? DDx includes PE among other causes. CP free at this time. CP obs pathway 2nd trop pending Tele monitor 2d echo D.Dimer pending Probably needs VQ scan if positive, has AKI today Probably needs cards consult if we can't come up with a cause for his S.Tach overnight.  Will put in message to P. Trent for AM eval.  AKI (acute kidney injury) (HCC) AKI on CKD: Creat 2.6 today up from 1.4 baseline. ? Pre-renal given the > 20:1 BUN:Cr ratio + N/V? Got 2L IVF bolus in ED Strict  intake and output Repeat CMP in AM Hold any nephrotoxic home meds No findings on CT AP to suggest obstruction. Check CPK Check UA  Transaminitis Unclear cause. Cholelithiasis but no evidence of duct obstruction, acute cholecysitits on CT nor [place].  No RUQ abd pain. Repeat CMP in AM  Heart failure with mildly reduced ejection fraction (HFmrEF) (HCC) Med rec pending at this time. Doesn't seem to be grossly volume overloaded on exam, labs, or imaging at this point. Getting updated 2d echo as noted under CP above  Pulmonary nodule 1 cm or greater in diameter 1.4cm RLL pulm nodule, stable to slightly decreased in size compared to prior. H/o Lung cancer per PMH, but other than this 1.4cm pulm nodule, rads didn't really see anything of concern on CT AP today.  High cholesterol Med rec pending Looks like he's on repatha ? Statin as well Cont statin if still on this med  HTN (hypertension) BPs actually on softer side in ED right now. Med rec pending Hold losartan due to AKI Cont metoprolol  DM2 (diabetes mellitus, type 2) (HCC) Med rec pending Sensitive SSI AC for the moment      Advance Care Planning:   Code Status: Full Code  Consults: Putting message in to P. Trent for AM cards eval.  Family Communication: No family in room  Severity of Illness: The appropriate patient status for this patient is OBSERVATION. Observation status is judged to be reasonable and necessary in order to provide the  required intensity of service to ensure the patient's safety. The patient's presenting symptoms, physical exam findings, and initial radiographic and laboratory data in the context of their medical condition is felt to place them at decreased risk for further clinical deterioration. Furthermore, it is anticipated that the patient will be medically stable for discharge from the hospital within 2 midnights of admission.   Author: Hillary Bow., DO 03/10/2023 12:09 AM  For on call review [email or web address].

## 2023-03-10 NOTE — ED Notes (Signed)
Pt being transported for imaging. CCMD notifed.

## 2023-03-10 NOTE — Progress Notes (Signed)
PHARMACY - ANTICOAGULATION CONSULT NOTE  Pharmacy Consult for heparin Indication: pulmonary embolus  Allergies  Allergen Reactions   Contrast Media [Iodinated Contrast Media] Anaphylaxis   Iodine Anaphylaxis   Sulfamethoxazole-Trimethoprim Nausea And Vomiting and Swelling    Other Reaction(s): Renal failure syndrome   Lisinopril Other (See Comments)    hyperkalemia  Other Reaction(s): Hyperkalemia   Varenicline     Other Reaction(s): Altered mental status   Ciprofloxacin Other (See Comments)    unknown   Metformin Other (See Comments)    Increased lactic acid  Other Reaction(s): Increased lactic acid level    Patient Measurements: Height: 5\' 9"  (175.3 cm) Weight: 84.4 kg (186 lb) IBW/kg (Calculated) : 70.7 Heparin Dosing Weight: TBW  Vital Signs: Temp: 97.7 F (36.5 C) (12/23 0929) Temp Source: Oral (12/23 0929) BP: 116/62 (12/23 1257) Pulse Rate: 124 (12/23 1257)  Labs: Recent Labs    03/09/23 1422 03/09/23 2311 03/10/23 0038 03/10/23 0729  HGB 16.4  --   --   --   HCT 46.8  --   --   --   PLT 396  --   --   --   CREATININE 2.65* 2.42*  --  2.25*  CKTOTAL  --   --  44*  --   TROPONINIHS 33* 24*  --   --     Estimated Creatinine Clearance: 32.3 mL/min (A) (by C-G formula based on SCr of 2.25 mg/dL (H)).   Medical History: Past Medical History:  Diagnosis Date   Cancer (HCC)    lung cancer   COPD (chronic obstructive pulmonary disease) (HCC)    Diabetes mellitus without complication (HCC)    Hyperlipemia    Hypertension     Assessment: 7 YOM presenting with CP, VQ scan shows mismatch consistent with PE, he is not on anticoagulation PTA however lovenox 30mg  SQ administered 12/23 @0925  - will give augmented heparin bolus considering this  Goal of Therapy:  Heparin level 0.3-0.7 units/ml Monitor platelets by anticoagulation protocol: Yes   Plan:  D/c lovenox 30mg  SQ q 24h Heparin 3000 units IV x 1, and gtt at 1450 units/hr F/u 6 hour heparin  level F/u long term [facility] plan  Daylene Posey, PharmD, [facility] Clinical Pharmacist ED Pharmacist Phone # [phone] 03/10/2023 1:03 PM
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
## 2023-03-10 NOTE — ED Notes (Signed)
Pt eating breakfast at bedside.

## 2023-03-10 NOTE — Progress Notes (Signed)
PROGRESS NOTE    Jabri Sturdevant  WUJ:[id number] DOB: 1955-12-20 DOA: 03/09/2023 PCP: Default, Provider, MD   Brief Narrative: Ladislav Glotfelty is a 67 y.o. 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  Patient presented secondary to chest pain concerning for possible cardiac etiology.  D-dimer elevated and VQ scan obtained which was significant for a right upper lobe pulmonary embolism.  Hospitalization also complicated by elevated AST/ALT/bilirubin with negative imaging.  Cardiology and gastroenterology consulted..   Assessment/Plan:  Chest pain Patient is high risk for cardiac chest pain with history of CAD/NSTEMI in May status post PCI to RCA with DES.  Patient with persistent tachycardia on admission and troponin elevation to 24.  D-dimer ordered and is elevated at 0.84.  Cardiology was consulted for evaluation as well.  Perfusion scan ordered and is significant for right upper lobe pulmonary embolism.  Chest pain is currently resolved. -Treat pulmonary embolism -Cardiology recommendations  CAD Recent PCI with stent placement. Patient is on aspirin and Brilinta as an outpatient. -Continue aspirin and Brilinta (confirmed in concurrent use of Heparin IV) per cardiology  AKI Baseline creatinine of about 1.3-1.6. Creatinine of 2.65 on admission. Presumed secondary to prerenal causes from hypovolemia secondary to nausea/vomiting. CT imaging without evidence of hydronephrosis or ureteral obstruction.  Patient started on IV fluids with some initial improvement of creatinine down to 2.25 today. -Encourage oral intake of fluids -Repeat metabolic panel in a.m.  Elevated AST/ALT Elevated ALP hyperbilirubinemia No associated abdominal pain. AST/ALT and bilirubin trending up. Imaging significant for evidence of cholelithiasis but without evidence of cholecystitis or biliary dilation. -May need MRCP;  will consult GI for recommendations  Hyponatremia Patient is euvolemic on exam. Concern for hypovolemic cause of hyponatremia no admission. Serum osmolality is slightly elevated at 300, however. Urine osmolality/sodium of 452/19 respectively. Sodium trended up slightly. -Recheck metabolic panel in AM  Acute right upper lobe pulmonary embolism Patient with chest pain and d-dimer of 0.84. Pulmonary perfusion scan performed on 12/23 identifying evidence of pulmonary embolism.  Echocardiogram this admission significant for mildly reduced right ventricular systolic function with normal right ventricular size.  Patient started on heparin IV. -Continue heparin IV -Transition to Eliquis once ready for discharge  HFrEF Previous LVEF of 45 to 50% in May 2024 LVEF worsened this admission to 35 to 40% with regional wall motion abnormalities.  Right lower lobe pulmonary nodule Noted on CT abdomen imaging. Nodule measuring 14 mm with slightly decreased size compared to prior.  Hypercholesterolemia Patient is on Repatha as an outpatient.  Primary hypertension Patient is on losartan and metoprolol as an outpatient. Losartan held secondary to AKI. -Resume home metoprolol  Diabetes mellitus type 2 Patient is on Jardiance and insuline glargine as an outpatient. Diabetes is poorly controlled with a hemoglobin A1C of 8.2%. -Continue SSI -Will restart insulin glargine if blood sugars become uncontrolled   DVT prophylaxis: Heparin IV Code Status:   Code Status: Full Code Family Communication: None at bedside Disposition Plan: Discharge home pending continued specialist recommendations and transition to oral anticoagulation   Consultants:  Cardiology [facility]  Procedures:  Transthoracic Echocardiogram  Antimicrobials: None    Subjective: Patient reports no chest pain since last night.  Objective: BP 128/67   Pulse 100   Temp 97.8 F (36.6 C)   Resp 15   Ht 5\' 9"  (1.753  m)   Wt 84.4 kg   SpO2 96%  BMI 27.47 kg/m   Examination:  General exam: Appears calm and comfortable Respiratory system: Clear to auscultation. Respiratory effort normal. Cardiovascular system: S1 & S2 heard, fast heart rate, normal rhythm. Gastrointestinal system: Abdomen is nondistended, soft and nontender. Normal bowel sounds heard. Central nervous system: Alert and oriented. No focal neurological deficits. Musculoskeletal: No edema. No calf tenderness Psychiatry: Judgement and insight appear normal. Mood & affect appropriate.    Data Reviewed: I have personally reviewed following labs and imaging studies   Last CBC Lab Results  Component Value Date   WBC 13.4 (H) 03/09/2023   HGB 16.4 03/09/2023   HCT 46.8 03/09/2023   MCV 92.3 03/09/2023   MCH 32.3 03/09/2023   RDW 14.6 03/09/2023   PLT 396 03/09/2023     Last metabolic panel Lab Results  Component Value Date   GLUCOSE 116 (H) 03/09/2023   NA 127 (L) 03/09/2023   K 3.6 03/09/2023   CL 95 (L) 03/09/2023   CO2 20 (L) 03/09/2023   BUN 67 (H) 03/09/2023   CREATININE 2.42 (H) 03/09/2023   GFRNONAA 29 (L) 03/09/2023   CALCIUM 7.6 (L) 03/09/2023   PHOS 3.3 07/23/2022   PROT 5.2 (L) 03/09/2023   ALBUMIN 2.4 (L) 03/09/2023   BILITOT 1.3 (H) 03/09/2023   ALKPHOS 155 (H) 03/09/2023   AST 235 (H) 03/09/2023   ALT 250 (H) 03/09/2023   ANIONGAP 12 03/09/2023     Creatinine Clearance: Estimated Creatinine Clearance: 30 mL/min (A) (by C-G formula based on SCr of 2.42 mg/dL (H)).  Recent Results (from the past 240 hours)  Resp panel by RT-PCR (RSV, Flu A&B, Covid) Anterior Nasal Swab     Status: None   Collection Time: 03/09/23  5:21 PM   Specimen: Anterior Nasal Swab  Result Value Ref Range Status   SARS Coronavirus 2 by RT PCR NEGATIVE NEGATIVE Final   Influenza A by PCR NEGATIVE NEGATIVE Final   Influenza B by PCR NEGATIVE NEGATIVE Final    Comment: (NOTE) The Xpert Xpress SARS-CoV-2/FLU/RSV plus assay is  intended as an aid in the diagnosis of influenza from Nasopharyngeal swab specimens and should not be used as a sole basis for treatment. Nasal washings and aspirates are unacceptable for Xpert Xpress SARS-CoV-2/FLU/RSV testing.  Fact Sheet for Patients: BloggerCourse.com  Fact Sheet for Healthcare Providers: SeriousBroker.it  This test is not yet approved or cleared by the [place] FDA and has been authorized for detection and/or diagnosis of SARS-CoV-2 by FDA under an Emergency Use Authorization (EUA). This EUA will remain in effect (meaning this test can be used) for the duration of the COVID-19 declaration under Section 564(b)(1) of the Act, 21 U.S.C. section 360bbb-3(b)(1), unless the authorization is terminated or revoked.     Resp Syncytial Virus by PCR NEGATIVE NEGATIVE Final    Comment: (NOTE) Fact Sheet for Patients: BloggerCourse.com  Fact Sheet for Healthcare Providers: SeriousBroker.it  This test is not yet approved or cleared by the [place] FDA and has been authorized for detection and/or diagnosis of SARS-CoV-2 by FDA under an Emergency Use Authorization (EUA). This EUA will remain in effect (meaning this test can be used) for the duration of the COVID-19 declaration under Section 564(b)(1) of the Act, 21 U.S.C. section 360bbb-3(b)(1), unless the authorization is terminated or revoked.  Performed at [facility], [address] [place], [place] [address]   Blood culture (routine x 2)     Status: None (Preliminary result)   Collection Time: 03/09/23  8:12 PM   Specimen: BLOOD RIGHT ARM  Result Value Ref Range Status   Specimen Description BLOOD RIGHT ARM  Final   Special Requests   Final    BOTTLES DRAWN AEROBIC AND ANAEROBIC Blood Culture adequate volume   Culture   Final    NO GROWTH < 12 HOURS Performed at [facility], [address] [place], [place] [address]    Report Status PENDING  Incomplete  Blood culture (routine x 2)     Status: None (Preliminary result)   Collection Time: 03/09/23  8:12 PM   Specimen: BLOOD LEFT ARM  Result Value Ref Range Status   Specimen Description BLOOD LEFT ARM  Final   Special Requests   Final    BOTTLES DRAWN AEROBIC AND ANAEROBIC Blood Culture adequate volume   Culture   Final    NO GROWTH < 12 HOURS Performed at [facility], [address] [place], [place] [address]    Report Status PENDING  Incomplete      Radiology Studies: US Abdomen Limited RUQ (LIVER/GB) Result Date: 03/09/2023 CLINICAL DATA:  Transaminitis EXAM: ULTRASOUND ABDOMEN LIMITED RIGHT UPPER QUADRANT COMPARISON:  CT today FINDINGS: Gallbladder: Small layering stones. No wall thickening or sonographic Murphy sign. Common bile duct: Diameter: Normal caliber, 5-6 mm. Liver: No focal lesion identified. Within normal limits in parenchymal echogenicity. Portal vein is patent on color Doppler imaging with normal direction of blood flow towards the liver. Other: None. IMPRESSION: Cholelithiasis.  No sonographic evidence of acute cholecystitis. Electronically Signed   By: Charlett Nose M.D.   On: 03/09/2023 22:22   CT ABDOMEN PELVIS WO CONTRAST Result Date: 03/09/2023 CLINICAL DATA:  Abdominal pain, vomiting, elevated LFTs. EXAM: CT ABDOMEN AND PELVIS WITHOUT CONTRAST TECHNIQUE: Multidetector CT imaging of the abdomen and pelvis was performed following the standard protocol without IV contrast. RADIATION DOSE REDUCTION: This exam was performed according to the departmental dose-optimization program which includes automated exposure control, adjustment of the mA and/or kV according to patient size and/or use of iterative reconstruction technique. COMPARISON:  None Available. FINDINGS: Lower chest: Calcifications crash that extensive calcifications throughout the visualized right and left circumflex coronary arteries.  Right lower lobe pulmonary nodule measures 1.4 cm on image 26. this compares to 1.7 cm on prior PET CT from 11/28/2022. No effusions. Hepatobiliary: Small layering gallstones within the gallbladder. No focal hepatic abnormality or biliary ductal dilatation. Pancreas: No focal abnormality or ductal dilatation. Spleen: No focal abnormality.  Normal size. Adrenals/Urinary Tract: No adrenal abnormality. No focal renal abnormality. No stones or hydronephrosis. Urinary bladder is unremarkable. Stomach/Bowel: Sigmoid diverticulosis. No active diverticulitis. Stomach and small bowel decompressed, unremarkable. Vascular/Lymphatic: Diffuse aortoiliac atherosclerosis. No evidence of aneurysm or adenopathy. Reproductive: No visible focal abnormality. Other: No free fluid or free air. Musculoskeletal: No acute bony abnormality. IMPRESSION: 1.4 cm right lower lobe pulmonary nodule, stable or slightly decreased in size since prior PET CT. Small layering gallstones. No evidence of acute cholecystitis or biliary ductal dilatation. Coronary artery disease, aortoiliac atherosclerosis. Sigmoid diverticulosis. No acute findings. Electronically Signed   By: Charlett Nose M.D.   On: 03/09/2023 20:59   DG Chest Port 1 View Result Date: 03/09/2023 CLINICAL DATA:  Pain.  Chest pain and shortness of breath. EXAM: PORTABLE CHEST 1 VIEW COMPARISON:  08/04/2022 FINDINGS: Normal heart size and pulmonary vascularity. No focal airspace disease or consolidation in the lungs. No blunting of costophrenic angles. No pneumothorax. Mediastinal contours appear intact. Calcification of the aorta.  IMPRESSION: No active disease. Electronically Signed   By: Burman Nieves M.D.   On: 03/09/2023 18:52      LOS: 0 days    Jacquelin Hawking, MD [facility] 03/10/2023, 7:21 AM   If 7PM-7AM, please contact night-coverage [email or web address]

## 2023-03-10 NOTE — Research (Signed)
EVOLVE patient who I have not  been able to reach since August.Certified letter was signed for on 02-05-2023. Patient is currently in the ED with chest pains. I went over  and spoke to the patient and asked him if  he wanted to continue in the study. The patient does not remember enrolling in the  study and  does not know if he ever  took the medication. He said he would just not continue but I could follow him  by medical records.     [place] Rafi Kenneth, Research Coordinator 03/10/2023  09:55 am

## 2023-03-10 NOTE — Research (Signed)
24-Week Follow-Up Visit Completed*  This patient has not answered phone calls or letters.    []  Not Necessary, No Potential Adverse Events Or Medication Issues Reported On Completed Subject Questionnaire   []  Yes, Contact With Subject/Alternate Contact Completed   [x]  Yes, No Contact With Subject/Alternate Contact Completed, But Electronic Health Record Was Reviewed   []  No, Unable To Contact Subject/Alternate Contact   Have you reviewed Ongoing medications on the Targeted Concomitant Medication form and updated the form as needed?   [x]  Yes   []  No   Subject Status*   [x]  Continuing In Follow-up   []  At Risk For Lost To Follow-up   []  Withdrawal From All Future Study Activities Including Passive Follow-up By Electronic Health Record Review Or Contact With Healthcare Provider Or Family Member/Friend   []  Death   Vital Status*   [x]  Alive   []  Deceased   []  Unknown   Last Known To Be Alive Source*   []  Subject Completed Follow-up Questionnaire/Seen In Person/Via Telephone Contact   []  Family Member or Caretaker   [x]  Primary Physician Or Medical Records   []  Publicly Available Source   []  Other  Date of last dose taken   DAY/MONTH/YEAR not sure   Over the last 12 weeks did the subject miss any doses? Not sure   Over the last 12 weeks did the subject restart Evolocumab after an interruption? Not sure

## 2023-03-10 NOTE — Consult Note (Signed)
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 sonographic evidence of acute cholecystitis. Electronically Signed   By: Charlett Nose M.D.   On: 03/09/2023 22:22   CT ABDOMEN PELVIS WO CONTRAST Result Date: 03/09/2023 CLINICAL DATA:  Abdominal pain, vomiting, elevated LFTs. EXAM: CT ABDOMEN AND PELVIS WITHOUT CONTRAST TECHNIQUE: Multidetector CT imaging of the abdomen and pelvis was performed following the standard protocol without IV contrast. RADIATION DOSE REDUCTION: This exam was performed according to the departmental dose-optimization program which includes automated exposure control, adjustment of the mA and/or kV according to patient size and/or use of iterative reconstruction technique. COMPARISON:  None Available. FINDINGS: Lower chest: Calcifications crash that extensive calcifications throughout the visualized right  and left circumflex coronary arteries. Right lower lobe pulmonary nodule measures 1.4 cm on image 26. this compares to 1.7 cm on prior PET CT from 11/28/2022. No effusions. Hepatobiliary: Small layering gallstones within the gallbladder. No focal hepatic abnormality or biliary ductal dilatation. Pancreas: No focal abnormality or ductal dilatation. Spleen: No focal abnormality.  Normal size. Adrenals/Urinary Tract: No adrenal abnormality. No focal renal abnormality. No stones or hydronephrosis. Urinary bladder is unremarkable. Stomach/Bowel: Sigmoid diverticulosis. No active diverticulitis. Stomach and small bowel decompressed, unremarkable. Vascular/Lymphatic: Diffuse aortoiliac atherosclerosis. No evidence of aneurysm or adenopathy. Reproductive: No visible focal abnormality. Other: No free fluid or free air. Musculoskeletal: No acute bony abnormality. IMPRESSION: 1.4 cm right lower lobe pulmonary nodule, stable or slightly decreased in size since prior PET CT. Small layering gallstones. No evidence of acute cholecystitis or biliary ductal dilatation. Coronary artery disease, aortoiliac atherosclerosis. Sigmoid diverticulosis. No acute findings. Electronically Signed   By: Charlett Nose M.D.   On: 03/09/2023 20:59   DG Chest Port 1 View Result Date: 03/09/2023 CLINICAL DATA:  Pain.  Chest pain and shortness of breath. EXAM: PORTABLE CHEST 1 VIEW COMPARISON:  08/04/2022 FINDINGS:  Normal heart size and pulmonary vascularity. No focal airspace disease or consolidation in the lungs. No blunting of costophrenic angles. No pneumothorax. Mediastinal contours appear intact. Calcification of the aorta. IMPRESSION: No active disease. Electronically Signed   By: Burman Nieves M.D.   On: 03/09/2023 18:52      Impression/Plan:   Elevated LFTs, history of hepatic steatosis    Latest Ref Rng & Units 03/10/2023    7:29 AM 03/09/2023   11:11 PM 03/09/2023    2:22 PM  Hepatic Function  Total Protein 6.5 - 8.1 g/dL  5.3  5.2  6.1   Albumin 3.5 - 5.0 g/dL 2.5  2.4  3.0   AST 15 - 41 U/L 377  235  246   ALT 0 - 44 U/L 342  250  292   Alk Phosphatase 38 - 126 U/L 154  155  196   Total Bilirubin <1.2 mg/dL 1.9  1.3  2.0    R factor 4.6, mixed injury Patent portal vein on [place] Patient does not have history of ETOH use. Patient denies any abdominal pain does have gallstones seen on CT and ultrasound no biliary dilatation will consider MRCP with contrast after kidney function improvement to rule out choledocholithiasis but with chronicity of labs and no abdominal pain lower risk. Medication review shows potential increase in Lipitor in May from 20 mg to 80 mg, this can cause hepatocellular toxicity which can be mixed, otherwise no medications that could contribute to DILI Patient's had chronic alk phos elevation since at least 2014 and intermittent AST ALT elevation, will check for hepatitis panel, GGT, AMA, ASMA, ANA, IgG to rule out autoimmune -will check INR -Trend LFTs/INR.  -Follow serologies for autoimmune hepatitis and metabolic liver disease. -Avoid hepatotoxic medications  GERD with history of achalasia s/p POEM 2019 No dysphagia, Takes TUMS PRN No evidence of GI bleeding but with triple therapy suggest pepcid/PPI for gastric protesction  AKI on CKD with proteinuria Had previous kidney biopsy, unable to see report Increase up with BUN/creatinine on 12/22 edition LFTs CT without contrast normal urine, no stones or hydronephrosis Urine unremarkable for infection, has increase protein CPK negative Some improvement with IV fluids Continue to trend  CAD with recent STEMI May 2024 status post DES Heart failure with reduced ejection fraction, repeat echo today EF 35 to 40% compared to 45% Aug 04, 2022 Troponin negative On Brilinta/bASA Cardiology following  VQ scan positive for PE Patient started on heparin 12/23 No history of DVT/PE  COPD with history of pulmonary nodule hypermetabolic  nodules right upper lobe and right lower lobe 11/28/2022 Decreased in size on CT here  Sinus tachycardia Possible from PE, dehydration, infection Pending blood cultures Respiratory panel negative  Principal Problem:   Chest pain Active Problems:   DM2 (diabetes mellitus, type 2) (HCC)   HTN (hypertension)   High cholesterol   Heart failure with mildly reduced ejection fraction (HFmrEF) (HCC)   CKD stage 3a, GFR 45-59 ml/min (HCC)   Transaminitis   AKI (acute kidney injury) (HCC)   Pulmonary nodule 1 cm or greater in diameter    LOS: 0 days   Thank you for your kind consultation, we will continue to follow.   Doree Albee  03/10/2023, 2:00 PM

## 2023-03-10 NOTE — Progress Notes (Signed)
   03/10/23 1837  Vitals  Temp 98.8 F (37.1 C)  Temp Source Oral  BP (!) 108/57  MAP (mmHg) 69  BP Location Right Arm  BP Method Automatic  Patient Position (if appropriate) Lying  Pulse Rate 92  Pulse Rate Source Monitor  Resp 18  MEWS COLOR  MEWS Score Color Green  Oxygen Therapy  SpO2 100 %  Pain Assessment  Pain Scale 0-10  Pain Score 0  Height and Weight  Height 5\' 9"  (1.753 m)  Weight 62.5 kg  Type of Scale Used Bed  BSA (Calculated - sq m) 1.74 sq meters  BMI (Calculated) 20.34  Weight in (lb) to have BMI = 25 168.9  MEWS Score  MEWS Temp 0  MEWS Systolic 0  MEWS Pulse 0  MEWS RR 0  MEWS LOC 0  MEWS Score 0   Patient arrived from ED via stretcher. Patient axox4 with no signs of distress, chest pain free. Tele monitor applied and CCMD notified. VS obtained and stable. Call bell within reach.

## 2023-03-10 NOTE — Progress Notes (Signed)
PHARMACY - ANTICOAGULATION CONSULT NOTE  Pharmacy Consult for heparin Indication: pulmonary embolus  Allergies  Allergen Reactions   Contrast Media [Iodinated Contrast Media] Anaphylaxis   Iodine Anaphylaxis   Sulfamethoxazole-Trimethoprim Nausea And Vomiting and Swelling    Other Reaction(s): Renal failure syndrome   Lisinopril Other (See Comments)    hyperkalemia  Other Reaction(s): Hyperkalemia   Varenicline     Other Reaction(s): Altered mental status   Ciprofloxacin Other (See Comments)    unknown   Metformin Other (See Comments)    Increased lactic acid  Other Reaction(s): Increased lactic acid level    Patient Measurements: Height: 5\' 9"  (175.3 cm) Weight: 62.5 kg (137 lb 12.6 oz) IBW/kg (Calculated) : 70.7 Heparin Dosing Weight: TBW  Vital Signs: Temp: 98.7 F (37.1 C) (12/23 1928) Temp Source: Oral (12/23 1928) BP: 99/57 (12/23 1928) Pulse Rate: 77 (12/23 1928)  Labs: Recent Labs    03/09/23 1422 03/09/23 2311 03/10/23 0038 03/10/23 0729 03/10/23 1611 03/10/23 2015  HGB 16.4  --   --   --   --   --   HCT 46.8  --   --   --   --   --   PLT 396  --   --   --   --   --   LABPROT  --   --   --   --  13.5  --   INR  --   --   --   --  1.0  --   HEPARINUNFRC  --   --   --   --   --  >1.10*  CREATININE 2.65* 2.42*  --  2.25*  --   --   CKTOTAL  --   --  44*  --   --   --   TROPONINIHS 33* 24*  --   --   --   --     Estimated Creatinine Clearance: 28.5 mL/min (A) (by C-G formula based on SCr of 2.25 mg/dL (H)).   Medical History: Past Medical History:  Diagnosis Date   Cancer (HCC)    lung cancer   COPD (chronic obstructive pulmonary disease) (HCC)    Diabetes mellitus without complication (HCC)    Hyperlipemia    Hypertension     Assessment: 76 YOM presenting with CP, VQ scan shows mismatch consistent with PE, he is not on anticoagulation PTA however lovenox 30mg  SQ administered 12/23 @0925  - will give augmented heparin bolus considering  this  PM: heparin level > 1.1 on heparin 1450 units/hr. Confirmed with RN that lab was drawn from opposite arm in which heparin is infusing. No bleeding issues reported.  Goal of Therapy:  Heparin level 0.3-0.7 units/ml Monitor platelets by anticoagulation protocol: Yes   Plan:  Hold heparin drip x 1 hour Resume at reduced rate of 1300 unit/hrs F/u 6 hour heparin level F/u long term AC plan  Loralee Pacas, PharmD, BCPS Clinical Pharmacist 03/10/2023 10:17 PM  Please check AMION for all [facility] phone numbers After 10:00 PM, call Main Pharmacy [phone]

## 2023-03-10 NOTE — ED Notes (Signed)
ED TO INPATIENT HANDOFF REPORT  ED Nurse Name and Phone #: Topher [phone]  S Name/Age/Gender Martin Thomas 67 y.o. male Room/Bed: 001C/001C  Code Status   Code Status: Full Code  Home/SNF/Other Home Patient oriented to: self, place, time, and situation Is this baseline? Yes   Triage Complete: Triage complete  Chief Complaint Transaminitis [R74.01]  Triage Note Pt to the  ed from home via ems with a CC of chest pain starting around 1400 while he was watching TV. Pt relays some sob with dizziness. Pt relays he has some indigestion x 2 days. Pt denies loc, back  pain at this time.     Allergies Allergies  Allergen Reactions   Contrast Media [Iodinated Contrast Media] Anaphylaxis   Iodine Anaphylaxis   Sulfamethoxazole-Trimethoprim Nausea And Vomiting and Swelling    Other Reaction(s): Renal failure syndrome   Lisinopril Other (See Comments)    hyperkalemia  Other Reaction(s): Hyperkalemia   Varenicline     Other Reaction(s): Altered mental status   Ciprofloxacin Other (See Comments)    unknown   Metformin Other (See Comments)    Increased lactic acid  Other Reaction(s): Increased lactic acid level    Level of Care/Admitting Diagnosis ED Disposition     ED Disposition  Admit   Condition  --   Comment  Hospital Area: [facility] [[id number]]  Level of Care: Telemetry Cardiac [[id number]]  May admit patient to [facility] or [facility] if equivalent level of care is available:: Yes  Covid Evaluation: Asymptomatic - no recent exposure (last 10 days) testing not required  Diagnosis: Transaminitis [956213]  Admitting Physician: Hillary Bow [[id number]]  Attending Physician: Narda Bonds [phone]  Certification:: I certify this patient will need inpatient services for at least 2 midnights  Expected Medical Readiness: 03/11/2023          B Medical/Surgery History Past Medical History:  Diagnosis Date   Cancer (HCC)    lung cancer   COPD (chronic  obstructive pulmonary disease) (HCC)    Diabetes mellitus without complication (HCC)    Hyperlipemia    Hypertension    Past Surgical History:  Procedure Laterality Date   APPENDECTOMY     BELOW KNEE LEG AMPUTATION  2009   left   CORONARY STENT INTERVENTION N/A 07/19/2022   Procedure: CORONARY STENT INTERVENTION;  Surgeon: Corky Crafts, MD;  Location: [facility] INVASIVE CV LAB;  Service: Cardiovascular;  Laterality: N/A;  RCA   CORONARY STENT PLACEMENT     CORONARY THROMBECTOMY N/A 07/19/2022   Procedure: Coronary Thrombectomy;  Surgeon: Corky Crafts, MD;  Location: [facility] INVASIVE CV LAB;  Service: Cardiovascular;  Laterality: N/A;  RCA   LEFT HEART CATH AND CORONARY ANGIOGRAPHY N/A 07/19/2022   Procedure: LEFT HEART CATH AND CORONARY ANGIOGRAPHY;  Surgeon: Corky Crafts, MD;  Location: [facility] INVASIVE CV LAB;  Service: Cardiovascular;  Laterality: N/A;   LEFT HEART CATHETERIZATION WITH CORONARY ANGIOGRAM N/A 03/24/2012   Procedure: LEFT HEART CATHETERIZATION WITH CORONARY ANGIOGRAM;  Surgeon: Marykay Lex, MD;  Location: [facility] CATH LAB;  Service: Cardiovascular;  Laterality: N/A;   PERCUTANEOUS CORONARY STENT INTERVENTION (PCI-S)  03/24/2012   Procedure: PERCUTANEOUS CORONARY STENT INTERVENTION (PCI-S);  Surgeon: Marykay Lex, MD;  Location: [facility] CATH LAB;  Service: Cardiovascular;;     A IV Location/Drains/Wounds Patient Lines/Drains/Airways Status     Active Line/Drains/Airways     Name Placement date Placement time Site Days   Peripheral IV 03/09/23 20 G Left Antecubital  03/09/23  1646  Antecubital  1   Peripheral IV 03/09/23 20 G Right Forearm 03/09/23  2005  Forearm   1   Wound / Incision (Open or Dehisced) 03/10/23 Skin tear Pretibial Left originally a blister that formed and popped, patient cut skin and wound now open 03/10/23  0714  Pretibial  less than 1            Intake/Output Last 24 hours  Intake/Output Summary (Last 24 hours) at 03/10/2023 1506 Last data  filed at 03/10/2023 1324 Gross per 24 hour  Intake 2000 ml  Output 300 ml  Net 1700 ml    Labs/Imaging Results for orders placed or performed during the hospital encounter of 03/09/23 (from the past 48 hours)  CBC     Status: Abnormal   Collection Time: 03/09/23  2:22 PM  Result Value Ref Range   WBC 13.4 (H) 4.0 - 10.5 K/uL   RBC 5.07 4.22 - 5.81 MIL/uL   Hemoglobin 16.4 13.0 - 17.0 g/dL   HCT 57.8 46.9 - 62.9 %   MCV 92.3 80.0 - 100.0 fL   MCH 32.3 26.0 - 34.0 pg   MCHC 35.0 30.0 - 36.0 g/dL   RDW 52.8 41.3 - 24.4 %   Platelets 396 150 - 400 K/uL   nRBC 0.0 0.0 - 0.2 %    Comment: Performed at [facility], [address], [place], [place] [address]  Comprehensive metabolic panel     Status: Abnormal   Collection Time: 03/09/23  2:22 PM  Result Value Ref Range   Sodium 127 (L) 135 - 145 mmol/L   Potassium 3.9 3.5 - 5.1 mmol/L    Comment: HEMOLYSIS AT THIS LEVEL MAY AFFECT RESULT   Chloride 91 (L) 98 - 111 mmol/L   CO2 20 (L) 22 - 32 mmol/L   Glucose, Bld 169 (H) 70 - 99 mg/dL    Comment: Glucose reference range applies only to samples taken after fasting for at least 8 hours.   BUN 73 (H) 8 - 23 mg/dL   Creatinine, Ser 2.53 (H) 0.61 - 1.24 mg/dL   Calcium 8.6 (L) 8.9 - 10.3 mg/dL   Total Protein 6.1 (L) 6.5 - 8.1 g/dL   Albumin 3.0 (L) 3.5 - 5.0 g/dL   AST 664 (H) 15 - 41 U/L    Comment: HEMOLYSIS AT THIS LEVEL MAY AFFECT RESULT   ALT 292 (H) 0 - 44 U/L    Comment: HEMOLYSIS AT THIS LEVEL MAY AFFECT RESULT   Alkaline Phosphatase 196 (H) 38 - 126 U/L   Total Bilirubin 2.0 (H) <1.2 mg/dL    Comment: HEMOLYSIS AT THIS LEVEL MAY AFFECT RESULT   GFR, Estimated 26 (L) >60 mL/min    Comment: (NOTE) Calculated using the CKD-EPI Creatinine Equation (2021)    Anion gap 16 (H) 5 - 15    Comment: Performed at [facility], [address] [place], [place] [address]  Troponin I (High Sensitivity)     Status: Abnormal   Collection Time: 03/09/23  2:22 PM  Result  Value Ref Range   Troponin I (High Sensitivity) 33 (H) <18 ng/L    Comment: (NOTE) Elevated high sensitivity troponin I (hsTnI) values and significant  changes across serial measurements may suggest ACS but many other  chronic and acute conditions are known to elevate hsTnI results.  Refer to the "Links" section for chest pain algorithms and additional  guidance. Performed at [facility], [address] [place], [place] [address]   Resp panel by RT-PCR (RSV, Flu A&B, Covid) Anterior Nasal Swab     Status: None   Collection Time: 03/09/23  5:21 PM   Specimen: Anterior Nasal Swab  Result Value Ref Range   SARS Coronavirus 2 by RT PCR NEGATIVE NEGATIVE   Influenza A by PCR NEGATIVE NEGATIVE   Influenza B by PCR NEGATIVE NEGATIVE    Comment: (NOTE) The Xpert Xpress SARS-CoV-2/FLU/RSV plus assay is intended as an aid in the diagnosis of influenza from Nasopharyngeal swab specimens and should not be used as a sole basis for treatment. Nasal washings and aspirates are unacceptable for Xpert Xpress SARS-CoV-2/FLU/RSV testing.  Fact Sheet for Patients: BloggerCourse.com  Fact Sheet for Healthcare Providers: SeriousBroker.it  This test is not yet approved or cleared by the [place] FDA and has been authorized for detection and/or diagnosis of SARS-CoV-2 by FDA under an Emergency Use Authorization (EUA). This EUA will remain in effect (meaning this test can be used) for the duration of the COVID-19 declaration under Section 564(b)(1) of the Act, 21 U.S.C. section 360bbb-3(b)(1), unless the authorization is terminated or revoked.     Resp Syncytial Virus by PCR NEGATIVE NEGATIVE    Comment: (NOTE) Fact Sheet for Patients: BloggerCourse.com  Fact Sheet for Healthcare Providers: SeriousBroker.it  This test is not yet approved or cleared by the [place] FDA and has  been authorized for detection and/or diagnosis of SARS-CoV-2 by FDA under an Emergency Use Authorization (EUA). This EUA will remain in effect (meaning this test can be used) for the duration of the COVID-19 declaration under Section 564(b)(1) of the Act, 21 U.S.C. section 360bbb-3(b)(1), unless the authorization is terminated or revoked.  Performed at [facility], [address], [place], [place] [address]   Urinalysis, Routine w reflex microscopic -Urine, Clean Catch     Status: Abnormal   Collection Time: 03/09/23  8:12 PM  Result Value Ref Range   Color, Urine YELLOW YELLOW   APPearance CLEAR CLEAR   Specific Gravity, Urine 1.023 1.005 - 1.030   pH 5.0 5.0 - 8.0   Glucose, UA NEGATIVE NEGATIVE mg/dL   Hgb urine dipstick NEGATIVE NEGATIVE   Bilirubin Urine NEGATIVE NEGATIVE   Ketones, ur NEGATIVE NEGATIVE mg/dL   Protein, ur >=829 (A) NEGATIVE mg/dL   Nitrite NEGATIVE NEGATIVE   Leukocytes,Ua NEGATIVE NEGATIVE   RBC / HPF 0-5 0 - 5 RBC/hpf   WBC, UA 0-5 0 - 5 WBC/hpf   Bacteria, UA NONE SEEN NONE SEEN   Squamous Epithelial / HPF 0-5 0 - 5 /HPF    Comment: Performed at [facility], [address], [place], [place] [address]  Blood culture (routine x 2)     Status: None (Preliminary result)   Collection Time: 03/09/23  8:12 PM   Specimen: BLOOD RIGHT ARM  Result Value Ref Range   Specimen Description BLOOD RIGHT ARM    Special Requests      BOTTLES DRAWN AEROBIC AND ANAEROBIC Blood Culture adequate volume   Culture      NO GROWTH < 12 HOURS Performed at [facility], [address], [place], [place] [address]    Report Status PENDING   Blood culture (routine x 2)     Status: None (Preliminary result)   Collection Time: 03/09/23  8:12 PM   Specimen: BLOOD LEFT ARM  Result Value Ref Range   Specimen Description BLOOD LEFT ARM  Special Requests      BOTTLES DRAWN AEROBIC AND ANAEROBIC Blood Culture adequate volume   Culture      NO GROWTH < 12  HOURS Performed at [facility], [address], [place], [place] [address]    Report Status PENDING   Osmolality, urine     Status: None   Collection Time: 03/09/23  8:12 PM  Result Value Ref Range   Osmolality, Ur 452 300 - 900 mOsm/kg    Comment: Performed at [facility], [address] [place], [place] [address]  Sodium, urine, random     Status: None   Collection Time: 03/09/23  8:12 PM  Result Value Ref Range   Sodium, Ur 19 mmol/L    Comment: Performed at [facility], [address] [place], [place] [address]  I-Stat CG4 Lactic Acid     Status: None   Collection Time: 03/09/23  8:18 PM  Result Value Ref Range   Lactic Acid, Venous 1.2 0.5 - 1.9 mmol/L  Troponin I (High Sensitivity)     Status: Abnormal   Collection Time: 03/09/23 11:11 PM  Result Value Ref Range   Troponin I (High Sensitivity) 24 (H) <18 ng/L    Comment: (NOTE) Elevated high sensitivity troponin I (hsTnI) values and significant  changes across serial measurements may suggest ACS but many other  chronic and acute conditions are known to elevate hsTnI results.  Refer to the "Links" section for chest pain algorithms and additional  guidance. Performed at [facility], [address], [place], [place] [address]   Comprehensive metabolic panel     Status: Abnormal   Collection Time: 03/09/23 11:11 PM  Result Value Ref Range   Sodium 127 (L) 135 - 145 mmol/L   Potassium 3.6 3.5 - 5.1 mmol/L    Comment: HEMOLYSIS AT THIS LEVEL MAY AFFECT RESULT   Chloride 95 (L) 98 - 111 mmol/L   CO2 20 (L) 22 - 32 mmol/L   Glucose, Bld 116 (H) 70 - 99 mg/dL    Comment: Glucose reference range applies only to samples taken after fasting for at least 8 hours.   BUN 67 (H) 8 - 23 mg/dL   Creatinine, Ser 2.95 (H) 0.61 - 1.24 mg/dL   Calcium 7.6 (L) 8.9 - 10.3 mg/dL   Total Protein 5.2 (L) 6.5 - 8.1 g/dL   Albumin 2.4 (L) 3.5 - 5.0 g/dL   AST 621 (H) 15 - 41 U/L    Comment: HEMOLYSIS AT THIS  LEVEL MAY AFFECT RESULT   ALT 250 (H) 0 - 44 U/L    Comment: HEMOLYSIS AT THIS LEVEL MAY AFFECT RESULT   Alkaline Phosphatase 155 (H) 38 - 126 U/L   Total Bilirubin 1.3 (H) <1.2 mg/dL    Comment: HEMOLYSIS AT THIS LEVEL MAY AFFECT RESULT   GFR, Estimated 29 (L) >60 mL/min    Comment: (NOTE) Calculated using the CKD-EPI Creatinine Equation (2021)    Anion gap 12 5 - 15    Comment: Performed at [facility] Lab, [address] [place], [place] [address]  Magnesium     Status: Abnormal   Collection Time: 03/09/23 11:11 PM  Result Value Ref Range   Magnesium 1.6 (L) 1.7 - 2.4 mg/dL    Comment: Performed at [facility], [address], [place], [place] [address]  I-Stat CG4 Lactic Acid     Status: None   Collection Time: 03/09/23 11:21 PM  Result Value Ref Range   Lactic Acid, Venous 1.4 0.5 - 1.9 mmol/L  D-dimer, quantitative     Status: Abnormal   Collection Time: 03/10/23 12:38 AM  Result Value Ref Range   D-Dimer, Quant 0.84 (H) 0.00 - 0.50 ug/mL-FEU    Comment: (NOTE) At the manufacturer cut-off value of 0.5 g/mL FEU, this assay has a negative predictive value of 95-100%.This assay is intended for use in conjunction with a clinical pretest probability (PTP) assessment model to exclude pulmonary embolism (PE) and deep venous thrombosis (DVT) in outpatients suspected of PE or DVT. Results should be correlated with clinical presentation. Performed at [facility], [address] [place], [place] [address]   Hemoglobin A1c     Status: Abnormal   Collection Time: 03/10/23 12:38 AM  Result Value Ref Range   Hgb A1c MFr Bld 8.2 (H) 4.8 - 5.6 %    Comment: (NOTE) Pre diabetes:          5.7%-6.4%  Diabetes:              >6.4%  Glycemic control for   <7.0% adults with diabetes    Mean Plasma Glucose 188.64 mg/dL    Comment: Performed at [facility] Lab, [address] [place], [place] [address]  CK     Status: Abnormal   Collection Time: 03/10/23 12:38 AM   Result Value Ref Range   Total CK 44 (L) 49 - 397 U/L    Comment: Performed at [facility], [address], [place], [place] [address]  Comprehensive metabolic panel     Status: Abnormal   Collection Time: 03/10/23  7:29 AM  Result Value Ref Range   Sodium 129 (L) 135 - 145 mmol/L   Potassium 3.7 3.5 - 5.1 mmol/L   Chloride 97 (L) 98 - 111 mmol/L   CO2 21 (L) 22 - 32 mmol/L   Glucose, Bld 93 70 - 99 mg/dL    Comment: Glucose reference range applies only to samples taken after fasting for at least 8 hours.   BUN 60 (H) 8 - 23 mg/dL   Creatinine, Ser 9.14 (H) 0.61 - 1.24 mg/dL   Calcium 7.9 (L) 8.9 - 10.3 mg/dL   Total Protein 5.3 (L) 6.5 - 8.1 g/dL   Albumin 2.5 (L) 3.5 - 5.0 g/dL   AST 782 (H) 15 - 41 U/L   ALT 342 (H) 0 - 44 U/L   Alkaline Phosphatase 154 (H) 38 - 126 U/L   Total Bilirubin 1.9 (H) <1.2 mg/dL   GFR, Estimated 31 (L) >60 mL/min    Comment: (NOTE) Calculated using the CKD-EPI Creatinine Equation (2021)    Anion gap 11 5 - 15    Comment: Performed at [facility], [address], [place], [place] [address]  Osmolality     Status: Abnormal   Collection Time: 03/10/23  7:29 AM  Result Value Ref Range   Osmolality 300 (H) 275 - 295 mOsm/kg    Comment: Performed at [facility], [address] [place], [place] [address]  CBG monitoring, ED     Status: None   Collection Time: 03/10/23  8:08 AM  Result Value Ref Range   Glucose-Capillary 94 70 - 99 mg/dL    Comment: Glucose reference range applies only to samples taken after fasting for at least 8 hours.  CBG monitoring, ED     Status: Abnormal   Collection Time: 03/10/23 12:52 PM  Result Value Ref Range   Glucose-Capillary 191 (H) 70 - 99 mg/dL    Comment: Glucose reference range applies only to samples taken after fasting for at least 8 hours.   ECHOCARDIOGRAM COMPLETE Result Date: 03/10/2023    ECHOCARDIOGRAM REPORT   Patient Name:   Martin Thomas Date of Exam: 03/10/2023 Medical Rec #:   [id number]        Height:       69.0 in Accession #:    [id number]       Weight:       186.0 lb Date of Birth:  03/26/1955         BSA:          2.003 m Patient Age:    67 years         BP:           128//67 mmHg Patient Gender: M                HR:           124 bpm. Exam Location:  Inpatient Procedure: 2D Echo, Cardiac Doppler and Color Doppler Indications:     Chest Pain R07.9  History:         Patient has prior history of Echocardiogram examinations, most                  recent 08/05/2022. COPD; Risk Factors:Hypertension and Diabetes.  Sonographer:     Darlys Gales Referring Phys:  [phone] JARED M GARDNER Diagnosing Phys: Clearnce Hasten IMPRESSIONS  1. Left ventricular ejection fraction, by estimation, is 35 to 40%. The left ventricle has moderately decreased function. The left ventricle demonstrates regional wall motion abnormalities (see scoring diagram/findings for description). Indeterminate diastolic filling due to E-A fusion. Hypokineis of the inferior and inferoseptal walls.  2. Right ventricular systolic function is mildly reduced. The right ventricular size is normal.  3. The mitral valve is normal in structure. No evidence of mitral valve regurgitation. No evidence of mitral stenosis.  4. The aortic valve is normal in structure. There is moderate calcification of the aortic valve. Aortic valve regurgitation is mild to moderate. Aortic valve sclerosis/calcification is present, without any evidence of aortic stenosis.  5. The inferior vena cava is normal in size with greater than 50% respiratory variability, suggesting right atrial pressure of 3 mmHg. FINDINGS  Left Ventricle: Left ventricular ejection fraction, by estimation, is 35 to 40%. The left ventricle has moderately decreased function. The left ventricle demonstrates regional wall motion abnormalities. The left ventricular internal cavity size was normal in size. There is no left ventricular hypertrophy. Indeterminate diastolic filling due to E-A  fusion.  LV Wall Scoring: Hypokineis of the inferior and inferoseptal walls. Right Ventricle: The right ventricular size is normal. No increase in right ventricular wall thickness. Right ventricular systolic function is mildly reduced. Left Atrium: Left atrial size was normal in size. Right Atrium: Right atrial size was normal in size. Pericardium: There is no evidence of pericardial effusion. Mitral Valve: The mitral valve is normal in structure. No evidence of mitral valve regurgitation. No evidence of mitral valve stenosis. Tricuspid Valve: The tricuspid valve is normal in structure. Tricuspid valve regurgitation is not demonstrated. No evidence of tricuspid stenosis. Aortic Valve: The aortic valve is normal in structure. There is moderate calcification of the aortic valve. Aortic valve regurgitation is mild to moderate. Aortic regurgitation PHT measures 323 msec. Aortic valve sclerosis/calcification is present, without any evidence of aortic stenosis. Aortic valve  peak gradient measures 15.7 mmHg. Pulmonic Valve: The pulmonic valve was normal in structure. Pulmonic valve regurgitation is not visualized. No evidence of pulmonic stenosis. Aorta: The aortic root is normal in size and structure. Venous: The inferior vena cava is normal in size with greater than 50% respiratory variability, suggesting right atrial pressure of 3 mmHg. IAS/Shunts: No atrial level shunt detected by color flow Doppler.  LEFT VENTRICLE PLAX 2D LVIDd:         4.60 cm LVIDs:         3.70 cm LV PW:         0.90 cm LV IVS:        0.90 cm  RIGHT VENTRICLE RV S prime:     10.10 cm/s TAPSE (M-mode): 1.2 cm LEFT ATRIUM             Index       RIGHT ATRIUM          Index LA Vol (A2C):   16.2 ml 8.09 ml/m  RA Area:     7.56 cm LA Vol (A4C):   19.8 ml 9.88 ml/m  RA Volume:   12.20 ml 6.09 ml/m LA Biplane Vol: 19.0 ml 9.48 ml/m  AORTIC VALVE AV Vmax:      198.00 cm/s AV Peak Grad: 15.7 mmHg LVOT Vmax:    112.00 cm/s AI PHT:       323 msec  AORTA  Ao Root diam: 2.70 cm MITRAL VALVE MV Area (PHT): 5.13 cm MV Decel Time: 148 msec MV E velocity: 126.00 cm/s Clearnce Hasten Electronically signed by Clearnce Hasten Signature Date/Time: 03/10/2023/1:13:42 PM    Final (Updated)    NM Pulmonary Perfusion Result Date: 03/10/2023 CLINICAL DATA:  Nonspecific chest pain, positive D-dimer EXAM: NUCLEAR MEDICINE PERFUSION LUNG SCAN TECHNIQUE: Perfusion images were obtained in multiple projections after intravenous injection of radiopharmaceutical. Ventilation scans intentionally deferred if perfusion scan and chest x-ray adequate for interpretation during COVID 19 epidemic. RADIOPHARMACEUTICALS:  4.0 mCi Tc-59m MAA IV COMPARISON:  03/09/2023, 11/28/2022 FINDINGS: Planar images of the lungs are obtained in multiple projections during the perfusion exam. There is a perfusion defect involving the apical segment of the right upper lobe, best seen on the anterior and posterior projections. No corresponding abnormality on chest x-ray. Otherwise normal perfusion throughout the remainder of the lung parenchyma. IMPRESSION: 1. Segmental perfusion defect apical segment right upper lobe without corresponding chest x-ray finding, consistent with pulmonary embolus. Critical Value/emergent results were called by telephone at the time of interpretation on 03/10/2023 at 1201 pm to provider DR Durwin Nora, who verbally acknowledged these results. Electronically Signed   By: Sharlet Salina M.D.   On: 03/10/2023 12:11   US Abdomen Limited RUQ (LIVER/GB) Result Date: 03/09/2023 CLINICAL DATA:  Transaminitis EXAM: ULTRASOUND ABDOMEN LIMITED RIGHT UPPER QUADRANT COMPARISON:  CT today FINDINGS: Gallbladder: Small layering stones. No wall thickening or sonographic Murphy sign. Common bile duct: Diameter: Normal caliber, 5-6 mm. Liver: No focal lesion identified. Within normal limits in parenchymal echogenicity. Portal vein is patent on color Doppler imaging with normal direction of blood flow  towards the liver. Other: None. IMPRESSION: Cholelithiasis.  No sonographic evidence of acute cholecystitis. Electronically Signed   By: Charlett Nose M.D.   On: 03/09/2023 22:22   CT ABDOMEN PELVIS WO CONTRAST Result Date: 03/09/2023 CLINICAL DATA:  Abdominal pain, vomiting, elevated LFTs. EXAM: CT ABDOMEN AND PELVIS WITHOUT CONTRAST TECHNIQUE: Multidetector CT imaging of the abdomen and pelvis was performed following the standard protocol without  IV contrast. RADIATION DOSE REDUCTION: This exam was performed according to the departmental dose-optimization program which includes automated exposure control, adjustment of the mA and/or kV according to patient size and/or use of iterative reconstruction technique. COMPARISON:  None Available. FINDINGS: Lower chest: Calcifications crash that extensive calcifications throughout the visualized right and left circumflex coronary arteries. Right lower lobe pulmonary nodule measures 1.4 cm on image 26. this compares to 1.7 cm on prior PET CT from 11/28/2022. No effusions. Hepatobiliary: Small layering gallstones within the gallbladder. No focal hepatic abnormality or biliary ductal dilatation. Pancreas: No focal abnormality or ductal dilatation. Spleen: No focal abnormality.  Normal size. Adrenals/Urinary Tract: No adrenal abnormality. No focal renal abnormality. No stones or hydronephrosis. Urinary bladder is unremarkable. Stomach/Bowel: Sigmoid diverticulosis. No active diverticulitis. Stomach and small bowel decompressed, unremarkable. Vascular/Lymphatic: Diffuse aortoiliac atherosclerosis. No evidence of aneurysm or adenopathy. Reproductive: No visible focal abnormality. Other: No free fluid or free air. Musculoskeletal: No acute bony abnormality. IMPRESSION: 1.4 cm right lower lobe pulmonary nodule, stable or slightly decreased in size since prior PET CT. Small layering gallstones. No evidence of acute cholecystitis or biliary ductal dilatation. Coronary artery  disease, aortoiliac atherosclerosis. Sigmoid diverticulosis. No acute findings. Electronically Signed   By: Charlett Nose M.D.   On: 03/09/2023 20:59   DG Chest Port 1 View Result Date: 03/09/2023 CLINICAL DATA:  Pain.  Chest pain and shortness of breath. EXAM: PORTABLE CHEST 1 VIEW COMPARISON:  08/04/2022 FINDINGS: Normal heart size and pulmonary vascularity. No focal airspace disease or consolidation in the lungs. No blunting of costophrenic angles. No pneumothorax. Mediastinal contours appear intact. Calcification of the aorta. IMPRESSION: No active disease. Electronically Signed   By: Burman Nieves M.D.   On: 03/09/2023 18:52    Pending Labs Unresulted Labs (From admission, onward)     Start     Ordered   03/11/23 0500  Comprehensive metabolic panel  Tomorrow morning,   R        03/10/23 1255   03/11/23 0500  CBC  Tomorrow morning,   R        03/10/23 1255   03/11/23 0500  Lipid panel  Tomorrow morning,   R        03/10/23 1256   03/11/23 0500  Heparin level (unfractionated)  Daily,   R     Placed in "And" Linked Group   03/10/23 1307   03/11/23 0500  CBC  Daily,   R     Placed in "And" Linked Group   03/10/23 1307   03/10/23 1900  Heparin level (unfractionated)  Once-Timed,   TIMED        03/10/23 1307   03/09/23 2359  Urinalysis, Complete w Microscopic -Urine, Clean Catch  Once,   R       Question:  Specimen Source  Answer:  Urine, Clean Catch   03/09/23 2358            Vitals/Pain Today's Vitals   03/10/23 1257 03/10/23 1324 03/10/23 1404 03/10/23 1448  BP: 116/62     Pulse: (!) 124     Resp: 19     Temp:  (!) 96.5 F (35.8 C)    TempSrc:  Axillary    SpO2: 98%     Weight:      Height:      PainSc:   6  5     Isolation Precautions No active isolations  Medications Medications  acetaminophen (TYLENOL) tablet 650 mg (650 mg Oral Given  03/10/23 1404)  oxyCODONE-acetaminophen (PERCOCET/ROXICET) 5-325 MG per tablet 1-2 tablet (has no administration in time  range)  ondansetron (ZOFRAN) injection 4 mg (has no administration in time range)  insulin aspart (novoLOG) injection 0-9 Units (2 Units Subcutaneous Given 03/10/23 1258)  influenza vaccine adjuvanted (FLUAD) injection 0.5 mL (has no administration in time range)  heparin ADULT infusion 100 units/mL (25000 units/216mL) (1,450 Units/hr Intravenous New Bag/Given 03/10/23 1319)  metoprolol succinate (TOPROL-XL) 24 hr tablet 50 mg (has no administration in time range)  mirtazapine (REMERON) tablet 30 mg (has no administration in time range)  aspirin EC tablet 81 mg (has no administration in time range)  ticagrelor (BRILINTA) tablet 90 mg (has no administration in time range)  famotidine (PEPCID) tablet 20 mg (20 mg Oral Given 03/09/23 1803)  alum & mag hydroxide-simeth (MAALOX/MYLANTA) 200-200-20 MG/5ML suspension 30 mL (30 mLs Oral Given 03/09/23 1757)  acetaminophen (TYLENOL) tablet 1,000 mg (1,000 mg Oral Given 03/09/23 1803)  lactated ringers bolus 1,000 mL (0 mLs Intravenous Stopped 03/09/23 1948)  ondansetron (ZOFRAN) injection 4 mg (4 mg Intravenous Given 03/09/23 1757)  lactated ringers bolus 1,000 mL (0 mLs Intravenous Stopped 03/09/23 2228)  technetium albumin aggregated (MAA) injection solution 4 millicurie (4 millicuries Intravenous Contrast Given 03/10/23 1100)  heparin bolus via infusion 3,000 Units (3,000 Units Intravenous Bolus from Bag 03/10/23 1320)    Mobility walks with device     Focused Assessments Cardiac Assessment Handoff:  Cardiac Rhythm: Sinus tachycardia Lab Results  Component Value Date   CKTOTAL 44 (L) 03/10/2023   TROPONINI <0.30 09/28/2013   Lab Results  Component Value Date   DDIMER 0.84 (H) 03/10/2023   Does the Patient currently have chest pain? No    R Recommendations: See Admitting Provider Note  Report given to:   Additional Notes:

## 2023-03-11 ENCOUNTER — Other Ambulatory Visit: Payer: Self-pay

## 2023-03-11 ENCOUNTER — Other Ambulatory Visit (HOSPITAL_COMMUNITY): Payer: Self-pay

## 2023-03-11 DIAGNOSIS — I5020 Unspecified systolic (congestive) heart failure: Secondary | ICD-10-CM

## 2023-03-11 DIAGNOSIS — R7989 Other specified abnormal findings of blood chemistry: Secondary | ICD-10-CM

## 2023-03-11 DIAGNOSIS — R072 Precordial pain: Secondary | ICD-10-CM | POA: Diagnosis not present

## 2023-03-11 DIAGNOSIS — N179 Acute kidney failure, unspecified: Secondary | ICD-10-CM | POA: Diagnosis not present

## 2023-03-11 DIAGNOSIS — R079 Chest pain, unspecified: Secondary | ICD-10-CM | POA: Diagnosis not present

## 2023-03-11 DIAGNOSIS — E871 Hypo-osmolality and hyponatremia: Secondary | ICD-10-CM

## 2023-03-11 DIAGNOSIS — N1831 Chronic kidney disease, stage 3a: Secondary | ICD-10-CM | POA: Diagnosis not present

## 2023-03-11 DIAGNOSIS — I2699 Other pulmonary embolism without acute cor pulmonale: Secondary | ICD-10-CM | POA: Diagnosis not present

## 2023-03-11 LAB — APTT: aPTT: 45 s — ABNORMAL HIGH (ref 24–36)

## 2023-03-11 LAB — COMPREHENSIVE METABOLIC PANEL
ALT: 530 U/L — ABNORMAL HIGH (ref 0–44)
AST: 480 U/L — ABNORMAL HIGH (ref 15–41)
Albumin: 2.9 g/dL — ABNORMAL LOW (ref 3.5–5.0)
Alkaline Phosphatase: 230 U/L — ABNORMAL HIGH (ref 38–126)
Anion gap: 9 (ref 5–15)
BUN: 59 mg/dL — ABNORMAL HIGH (ref 8–23)
CO2: 25 mmol/L (ref 22–32)
Calcium: 7.9 mg/dL — ABNORMAL LOW (ref 8.9–10.3)
Chloride: 93 mmol/L — ABNORMAL LOW (ref 98–111)
Creatinine, Ser: 2.07 mg/dL — ABNORMAL HIGH (ref 0.61–1.24)
GFR, Estimated: 35 mL/min — ABNORMAL LOW (ref >60)
Glucose, Bld: 104 mg/dL — ABNORMAL HIGH (ref 70–99)
Potassium: 3.5 mmol/L (ref 3.5–5.1)
Sodium: 127 mmol/L — ABNORMAL LOW (ref 135–145)
Total Bilirubin: 0.9 mg/dL (ref <1.2)
Total Protein: 5.9 g/dL — ABNORMAL LOW (ref 6.5–8.1)

## 2023-03-11 LAB — CBC
HCT: 36.8 % — ABNORMAL LOW (ref 39.0–52.0)
Hemoglobin: 12.9 g/dL — ABNORMAL LOW (ref 13.0–17.0)
MCH: 32.6 pg (ref 26.0–34.0)
MCHC: 35.1 g/dL (ref 30.0–36.0)
MCV: 92.9 fL (ref 80.0–100.0)
Platelets: 320 10*3/uL (ref 150–400)
RBC: 3.96 MIL/uL — ABNORMAL LOW (ref 4.22–5.81)
RDW: 14.3 % (ref 11.5–15.5)
WBC: 9.6 10*3/uL (ref 4.0–10.5)
nRBC: 0 % (ref 0.0–0.2)

## 2023-03-11 LAB — LIPID PANEL
Cholesterol: 147 mg/dL (ref 0–200)
HDL: 49 mg/dL (ref >40)
LDL Cholesterol: 70 mg/dL (ref 0–99)
Total CHOL/HDL Ratio: 3 {ratio}
Triglycerides: 141 mg/dL (ref <150)
VLDL: 28 mg/dL (ref 0–40)

## 2023-03-11 LAB — MITOCHONDRIAL ANTIBODIES: Mitochondrial M2 Ab, IgG: <20 U (ref 0.0–20.0)

## 2023-03-11 LAB — SODIUM: Sodium: 132 mmol/L — ABNORMAL LOW (ref 135–145)

## 2023-03-11 LAB — IGG: IgG (Immunoglobin G), Serum: 807 mg/dL (ref 603–1613)

## 2023-03-11 LAB — GLIA (IGA/G) + TTG IGA
Antigliadin Abs, IgA: 3 U (ref 0–19)
Gliadin IgG: 2 U (ref 0–19)
Tissue Transglutaminase Ab, IgA: <2 U/mL (ref 0–3)

## 2023-03-11 LAB — GLUCOSE, CAPILLARY
Glucose-Capillary: 93 mg/dL (ref 70–99)
Glucose-Capillary: 208 mg/dL — ABNORMAL HIGH (ref 70–99)
Glucose-Capillary: 183 mg/dL — ABNORMAL HIGH (ref 70–99)
Glucose-Capillary: 119 mg/dL — ABNORMAL HIGH (ref 70–99)
Glucose-Capillary: 170 mg/dL — ABNORMAL HIGH (ref 70–99)

## 2023-03-11 LAB — ANTI-SMOOTH MUSCLE ANTIBODY, IGG: F-Actin IgG: 4 U (ref 0–19)

## 2023-03-11 LAB — HEPARIN LEVEL (UNFRACTIONATED)
Heparin Unfractionated: <0.1 [IU]/mL — ABNORMAL LOW (ref 0.30–0.70)
Heparin Unfractionated: 0.2 [IU]/mL — ABNORMAL LOW (ref 0.30–0.70)

## 2023-03-11 LAB — ANA W/REFLEX IF POSITIVE: Anti Nuclear Antibody (ANA): NEGATIVE

## 2023-03-11 MED ORDER — HEPARIN BOLUS VIA INFUSION
2000.0000 [IU] | Freq: Once | INTRAVENOUS | Status: AC
  Administered 2023-03-11: 2000 [IU] via INTRAVENOUS
  Filled 2023-03-11: qty 2000

## 2023-03-11 MED ORDER — HEPARIN BOLUS VIA INFUSION
1800.0000 [IU] | Freq: Once | INTRAVENOUS | Status: AC
  Administered 2023-03-11: 1800 [IU] via INTRAVENOUS
  Filled 2023-03-11: qty 1800

## 2023-03-11 NOTE — Progress Notes (Signed)
Per [place]-patient no longer taking. Dis-enrolling at this time.

## 2023-03-11 NOTE — Progress Notes (Signed)
Progress Note  Primary GI:  [facility] last seen 2019       DOA: 03/09/2023         Hospital Day: 3   Subjective  Chief Complaint: Elevated liver function  No family was present at the time of my evaluation. Patient states he is feeling much better than yesterday Denies any nausea, vomiting no abdominal pain.  Has not had a bowel movement yet.  No fevers or chills.  States he still coughing but breathing has improved.    Objective   Vital signs in last 24 hours: Temp:  [97.7 F (36.5 C)-98.8 F (37.1 C)] 97.7 F (36.5 C) (12/24 1115) Pulse Rate:  [74-123] 74 (12/24 1115) Resp:  [16-20] 18 (12/24 1115) BP: (99-121)/(37-86) 109/46 (12/24 1115) SpO2:  [96 %-100 %] 98 % (12/24 1115) Weight:  [62.5 kg] 62.5 kg (12/24 0516) Last BM Date : 03/09/23 (per patient) Last BM recorded by nurses in past 5 days No data recorded  General:   Chronically ill-appearing, thin male in no acute distress Heart: Regular rate and rhythm no murmur Pulm: Clear anteriorly; no wheezing Abdomen:  Soft, Non-distended AB, Active bowel sounds. No tenderness, No organomegaly appreciated. Extremities:  Without edema. Msk:  Symmetrical without gross deformities. Peripheral pulses intact.  Neurologic:  Alert and  oriented x4;  No focal deficits.  No asterixis Skin:   Dry and intact without significant lesions or rashes. Psychiatric:  Cooperative. Normal mood and affect.  Intake/Output from previous day: 12/23 0701 - 12/24 0700 In: 120 [P.O.:120] Out: 1000 [Urine:1000] Intake/Output this shift: Total I/O In: 480 [P.O.:480] Out: 1100 [Urine:1100]  Studies/Results: ECHOCARDIOGRAM COMPLETE Result Date: 03/10/2023    ECHOCARDIOGRAM REPORT   Patient Name:   Martin Thomas Date of Exam: 03/10/2023 Medical Rec #:  [id number]        Height:       69.0 in Accession #:    [id number]       Weight:       186.0 lb Date of Birth:  09/26/1955         BSA:          2.003 m Patient Age:    67 years          BP:           128//67 mmHg Patient Gender: M                HR:           124 bpm. Exam Location:  Inpatient Procedure: 2D Echo, Cardiac Doppler and Color Doppler Indications:     Chest Pain R07.9  History:         Patient has prior history of Echocardiogram examinations, most                  recent 08/05/2022. COPD; Risk Factors:Hypertension and Diabetes.  Sonographer:     Darlys Gales Referring Phys:  [phone] JARED M GARDNER Diagnosing Phys: Clearnce Hasten IMPRESSIONS  1. Left ventricular ejection fraction, by estimation, is 35 to 40%. The left ventricle has moderately decreased function. The left ventricle demonstrates regional wall motion abnormalities (see scoring diagram/findings for description). Indeterminate diastolic filling due to E-A fusion. Hypokineis of the inferior and inferoseptal walls.  2. Right ventricular systolic function is mildly reduced. The right ventricular size is normal.  3. The mitral valve is normal in structure. No evidence of mitral valve regurgitation. No evidence of mitral stenosis.  4.  The aortic valve is normal in structure. There is moderate calcification of the aortic valve. Aortic valve regurgitation is mild to moderate. Aortic valve sclerosis/calcification is present, without any evidence of aortic stenosis.  5. The inferior vena cava is normal in size with greater than 50% respiratory variability, suggesting right atrial pressure of 3 mmHg. FINDINGS  Left Ventricle: Left ventricular ejection fraction, by estimation, is 35 to 40%. The left ventricle has moderately decreased function. The left ventricle demonstrates regional wall motion abnormalities. The left ventricular internal cavity size was normal in size. There is no left ventricular hypertrophy. Indeterminate diastolic filling due to E-A fusion.  LV Wall Scoring: Hypokineis of the inferior and inferoseptal walls. Right Ventricle: The right ventricular size is normal. No increase in right ventricular wall thickness.  Right ventricular systolic function is mildly reduced. Left Atrium: Left atrial size was normal in size. Right Atrium: Right atrial size was normal in size. Pericardium: There is no evidence of pericardial effusion. Mitral Valve: The mitral valve is normal in structure. No evidence of mitral valve regurgitation. No evidence of mitral valve stenosis. Tricuspid Valve: The tricuspid valve is normal in structure. Tricuspid valve regurgitation is not demonstrated. No evidence of tricuspid stenosis. Aortic Valve: The aortic valve is normal in structure. There is moderate calcification of the aortic valve. Aortic valve regurgitation is mild to moderate. Aortic regurgitation PHT measures 323 msec. Aortic valve sclerosis/calcification is present, without any evidence of aortic stenosis. Aortic valve peak gradient measures 15.7 mmHg. Pulmonic Valve: The pulmonic valve was normal in structure. Pulmonic valve regurgitation is not visualized. No evidence of pulmonic stenosis. Aorta: The aortic root is normal in size and structure. Venous: The inferior vena cava is normal in size with greater than 50% respiratory variability, suggesting right atrial pressure of 3 mmHg. IAS/Shunts: No atrial level shunt detected by color flow Doppler.  LEFT VENTRICLE PLAX 2D LVIDd:         4.60 cm LVIDs:         3.70 cm LV PW:         0.90 cm LV IVS:        0.90 cm  RIGHT VENTRICLE RV S prime:     10.10 cm/s TAPSE (M-mode): 1.2 cm LEFT ATRIUM             Index       RIGHT ATRIUM          Index LA Vol (A2C):   16.2 ml 8.09 ml/m  RA Area:     7.56 cm LA Vol (A4C):   19.8 ml 9.88 ml/m  RA Volume:   12.20 ml 6.09 ml/m LA Biplane Vol: 19.0 ml 9.48 ml/m  AORTIC VALVE AV Vmax:      198.00 cm/s AV Peak Grad: 15.7 mmHg LVOT Vmax:    112.00 cm/s AI PHT:       323 msec  AORTA Ao Root diam: 2.70 cm MITRAL VALVE MV Area (PHT): 5.13 cm MV Decel Time: 148 msec MV E velocity: 126.00 cm/s Clearnce Hasten Electronically signed by Clearnce Hasten Signature  Date/Time: 03/10/2023/1:13:42 PM    Final (Updated)    NM Pulmonary Perfusion Result Date: 03/10/2023 CLINICAL DATA:  Nonspecific chest pain, positive D-dimer EXAM: NUCLEAR MEDICINE PERFUSION LUNG SCAN TECHNIQUE: Perfusion images were obtained in multiple projections after intravenous injection of radiopharmaceutical. Ventilation scans intentionally deferred if perfusion scan and chest x-ray adequate for interpretation during COVID 19 epidemic. RADIOPHARMACEUTICALS:  4.0 mCi Tc-24m MAA IV COMPARISON:  03/09/2023, 11/28/2022  FINDINGS: Planar images of the lungs are obtained in multiple projections during the perfusion exam. There is a perfusion defect involving the apical segment of the right upper lobe, best seen on the anterior and posterior projections. No corresponding abnormality on chest x-ray. Otherwise normal perfusion throughout the remainder of the lung parenchyma. IMPRESSION: 1. Segmental perfusion defect apical segment right upper lobe without corresponding chest x-ray finding, consistent with pulmonary embolus. Critical Value/emergent results were called by telephone at the time of interpretation on 03/10/2023 at 1201 pm to provider DR Durwin Nora, who verbally acknowledged these results. Electronically Signed   By: Sharlet Salina M.D.   On: 03/10/2023 12:11   US Abdomen Limited RUQ (LIVER/GB) Result Date: 03/09/2023 CLINICAL DATA:  Transaminitis EXAM: ULTRASOUND ABDOMEN LIMITED RIGHT UPPER QUADRANT COMPARISON:  CT today FINDINGS: Gallbladder: Small layering stones. No wall thickening or sonographic Murphy sign. Common bile duct: Diameter: Normal caliber, 5-6 mm. Liver: No focal lesion identified. Within normal limits in parenchymal echogenicity. Portal vein is patent on color Doppler imaging with normal direction of blood flow towards the liver. Other: None. IMPRESSION: Cholelithiasis.  No sonographic evidence of acute cholecystitis. Electronically Signed   By: Charlett Nose M.D.   On: 03/09/2023 22:22    CT ABDOMEN PELVIS WO CONTRAST Result Date: 03/09/2023 CLINICAL DATA:  Abdominal pain, vomiting, elevated LFTs. EXAM: CT ABDOMEN AND PELVIS WITHOUT CONTRAST TECHNIQUE: Multidetector CT imaging of the abdomen and pelvis was performed following the standard protocol without IV contrast. RADIATION DOSE REDUCTION: This exam was performed according to the departmental dose-optimization program which includes automated exposure control, adjustment of the mA and/or kV according to patient size and/or use of iterative reconstruction technique. COMPARISON:  None Available. FINDINGS: Lower chest: Calcifications crash that extensive calcifications throughout the visualized right and left circumflex coronary arteries. Right lower lobe pulmonary nodule measures 1.4 cm on image 26. this compares to 1.7 cm on prior PET CT from 11/28/2022. No effusions. Hepatobiliary: Small layering gallstones within the gallbladder. No focal hepatic abnormality or biliary ductal dilatation. Pancreas: No focal abnormality or ductal dilatation. Spleen: No focal abnormality.  Normal size. Adrenals/Urinary Tract: No adrenal abnormality. No focal renal abnormality. No stones or hydronephrosis. Urinary bladder is unremarkable. Stomach/Bowel: Sigmoid diverticulosis. No active diverticulitis. Stomach and small bowel decompressed, unremarkable. Vascular/Lymphatic: Diffuse aortoiliac atherosclerosis. No evidence of aneurysm or adenopathy. Reproductive: No visible focal abnormality. Other: No free fluid or free air. Musculoskeletal: No acute bony abnormality. IMPRESSION: 1.4 cm right lower lobe pulmonary nodule, stable or slightly decreased in size since prior PET CT. Small layering gallstones. No evidence of acute cholecystitis or biliary ductal dilatation. Coronary artery disease, aortoiliac atherosclerosis. Sigmoid diverticulosis. No acute findings. Electronically Signed   By: Charlett Nose M.D.   On: 03/09/2023 20:59   DG Chest Port 1 View Result  Date: 03/09/2023 CLINICAL DATA:  Pain.  Chest pain and shortness of breath. EXAM: PORTABLE CHEST 1 VIEW COMPARISON:  08/04/2022 FINDINGS: Normal heart size and pulmonary vascularity. No focal airspace disease or consolidation in the lungs. No blunting of costophrenic angles. No pneumothorax. Mediastinal contours appear intact. Calcification of the aorta. IMPRESSION: No active disease. Electronically Signed   By: Burman Nieves M.D.   On: 03/09/2023 18:52    Lab Results: Recent Labs    03/09/23 1422 03/11/23 0240  WBC 13.4* 9.6  HGB 16.4 12.9*  HCT 46.8 36.8*  PLT 396 320   BMET Recent Labs    03/09/23 2311 03/10/23 0729 03/11/23 0240  NA 127* 129* 127*  K 3.6 3.7 3.5  CL 95* 97* 93*  CO2 20* 21* 25  GLUCOSE 116* 93 104*  BUN 67* 60* 59*  CREATININE 2.42* 2.25* 2.07*  CALCIUM 7.6* 7.9* 7.9*   LFT Recent Labs    03/11/23 0240  PROT 5.9*  ALBUMIN 2.9*  AST 480*  ALT 530*  ALKPHOS 230*  BILITOT 0.9   PT/INR Recent Labs    03/10/23 1611  LABPROT 13.5  INR 1.0     Scheduled Meds:  influenza vaccine adjuvanted  0.5 mL Intramuscular Tomorrow-1000   insulin aspart  0-9 Units Subcutaneous TID WC   metoprolol succinate  50 mg Oral Daily   mirtazapine  30 mg Oral QHS   ticagrelor  90 mg Oral BID   Continuous Infusions:  heparin 1,400 Units/hr (03/11/23 1134)      Patient profile:   67 year old male with extensive medical history to include coronary artery disease with multiple NSTEMI's, STEMI in May of this year, peripheral artery disease status post left BKA, COPD, achalasia status post POEM, admitted with several days of "indigestion" with subsequent development of chest pain and shortness of breath, found to have acute on chronic liver enzyme elevation, acute on chronic kidney injury and evidence of pulmonary embolism on VQ scan.    Impression/Plan:   Elevated LFTs, history of hepatic steatosis Recent Labs  Lab 03/09/23 1422 03/09/23 2311 03/10/23 0729  03/11/23 0240  AST 246* 235* 377* 480*  ALT 292* 250* 342* 530*  ALKPHOS 196* 155* 154* 230*  BILITOT 2.0* 1.3* 1.9* 0.9  PROT 6.1* 5.2* 5.3* 5.9*  ALBUMIN 3.0* 2.4* 2.5* 2.9*   AST 480 ALT 530 Alkphos 230 TBili 0.9 03/10/2023 INR 1.0 R factor 4.6, mixed injury Patient with gallstones on ultrasound no biliary dilation negative Murphy sign no abdominal pain.  AST/ALT/alk phos increasing bilirubin decreased to 0.9 Patient's had chronic alk phos elevation since at least 2014 and intermittent AST ALT elevation: GGT 328, has immunity to hepatitis A negative hepatitis A IgM, hepatitis B surface antigen and core, hepatitis C negative, IgG negative, ASMA and AMA negative, negative celiac, normal INR. Low suspicion for choledocholithiasis but may need to consider MRCP with negative autoimmune workup, consider tomorrow or Thursday with improving kidney function, sooner if any AB pain or working bilirubin -Trend LFTs/INR.  -Avoid hepatotoxic medications   GERD with history of achalasia s/p POEM 2019 No dysphagia, Takes TUMS PRN No evidence of GI bleeding but suggest pepcid/PPI for gastric protection   AKI on CKD with proteinuria BUN 59 Cr 2.07  GFR 35  Potassium 3.5  Magnesium 1.6  CT without contrast normal urine, no stones or hydronephrosis Urine unremarkable for infection, has increase protein CPK negative Some improvement with IV fluids Continue to trend   CAD with recent STEMI May 2024 status post DES Heart failure with reduced ejection fraction repeat echo today EF 35 to 40% compared to 45% Aug 04, 2022 Troponin negative, no further chest pain today On Brilinta/bASA Cardiology following   VQ scan positive for PE Patient started on heparin 12/23 No history of DVT/PE   COPD with history of pulmonary nodule hypermetabolic nodules right upper lobe and right lower lobe 11/28/2022 Decreased in size   Principal Problem:   Chest pain Active Problems:   DM2 (diabetes mellitus, type  2) (HCC)   HTN (hypertension)   High cholesterol   Heart failure with mildly reduced ejection fraction (HFmrEF) (HCC)   CKD stage 3a, GFR 45-59 ml/min (HCC)   Transaminitis  AKI (acute kidney injury) (HCC)   Pulmonary nodule 1 cm or greater in diameter   Elevated liver function tests   Pulmonary embolus (HCC)   Precordial chest pain   Elevated troponin   HFrEF (heart failure with reduced ejection fraction) (HCC)    LOS: 1 day   Doree Albee  03/11/2023, 2:55 PM

## 2023-03-11 NOTE — Progress Notes (Addendum)
PHARMACY - ANTICOAGULATION CONSULT NOTE  Pharmacy Consult for heparin Indication: pulmonary embolus  Allergies  Allergen Reactions   Contrast Media [Iodinated Contrast Media] Anaphylaxis   Iodine Anaphylaxis   Sulfamethoxazole-Trimethoprim Nausea And Vomiting and Swelling    Other Reaction(s): Renal failure syndrome   Lisinopril Other (See Comments)    hyperkalemia  Other Reaction(s): Hyperkalemia   Varenicline     Other Reaction(s): Altered mental status   Ciprofloxacin Other (See Comments)    unknown   Metformin Other (See Comments)    Increased lactic acid  Other Reaction(s): Increased lactic acid level    Patient Measurements: Height: 5\' 9"  (175.3 cm) Weight: 62.5 kg (137 lb 12.6 oz) IBW/kg (Calculated) : 70.7 Heparin Dosing Weight: TBW  Vital Signs: Temp: 98 F (36.7 C) (12/24 0720) Temp Source: Oral (12/24 0720) BP: 102/52 (12/24 0720) Pulse Rate: 81 (12/24 0720)  Labs: Recent Labs    03/09/23 1422 03/09/23 2311 03/10/23 0038 03/10/23 0729 03/10/23 1611 03/10/23 2015 03/11/23 0240 03/11/23 1017  HGB 16.4  --   --   --   --   --  12.9*  --   HCT 46.8  --   --   --   --   --  36.8*  --   PLT 396  --   --   --   --   --  320  --   LABPROT  --   --   --   --  13.5  --   --   --   INR  --   --   --   --  1.0  --   --   --   HEPARINUNFRC  --   --   --   --   --  >1.10*  --  <0.10*  CREATININE 2.65* 2.42*  --  2.25*  --   --  2.07*  --   CKTOTAL  --   --  44*  --   --   --   --   --   TROPONINIHS 33* 24*  --   --   --   --   --   --     Estimated Creatinine Clearance: 31 mL/min (A) (by C-G formula based on SCr of 2.07 mg/dL (H)).   Medical History: Past Medical History:  Diagnosis Date   Cancer (HCC)    lung cancer   COPD (chronic obstructive pulmonary disease) (HCC)    Diabetes mellitus without complication (HCC)    Hyperlipemia    Hypertension     Assessment: 44 YOM presenting with CP, VQ scan shows mismatch consistent with PE, he is not on  anticoagulation PTA however lovenox 30mg  SQ administered 12/23 @0925  - will give augmented heparin bolus considering this  Heparin level < 0.1 is subtherapeutic with heparin running at 1300 units/hr. Hgb (12.9) has decreased significantly since 12/22. PLTs (320) are stable. Per RN, no report of pauses, issues with the line, or signs of bleeding.   Given supratherapeutic level yesterday after bolus, will give small bolus and conservative rate increase. Notably, phlebotomy had a difficult time convincing him to let them draw his heparin lab this morning, per MD will hold off on transition to oral anticoagulant until procedures are complete.  Goal of Therapy:  Heparin level 0.3-0.7 units/ml Monitor platelets by anticoagulation protocol: Yes   Plan:  Give heparin bolus 2000 units x1 Increase infusion to 1400 unit/hrs Check 8 hour heparin level Monitor daily heparin level, CBC, and  signs/symptoms of bleeding F/u long term [facility] plan   Romie Minus, PharmD PGY1 Pharmacy Resident  Please check AMION for all [facility] phone numbers After 10:00 PM, call Main Pharmacy [phone] 03/11/2023 11:06 AM

## 2023-03-11 NOTE — Evaluation (Signed)
Physical Therapy Evaluation Patient Details Name: Martin Thomas MRN: [id number] DOB: 05/28/1955 Today's Date: 03/11/2023  History of Present Illness  Patient is 66 y.o. male presents with chest pain and found to have acute PE on VQ scan. TEE significant ofr LVEF of 35-40%. 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   Clinical Impression  Martin Thomas is 67 y.o. male admitted with above HPI and diagnosis. Patient is currently limited by functional impairments below (see PT problem list). Patient lives with family and is mod ind with WC for mobility at baseline. Patient evaluated by Physical Therapy with no further acute PT needs identified. All education has been completed and the patient has no further questions. Pt currently mobilizing at mod ind/supervision level and is completing squat pivot transfers at supervision level and bed mobility at mod ind level.  See below for any follow-up Physical Therapy or equipment needs. PT is signing off. Thank you for this referral.       If plan is discharge home, recommend the following: Assist for transportation;Help with stairs or ramp for entrance;Assistance with cooking/housework   Can travel by private vehicle        Equipment Recommendations None recommended by PT  Recommendations for Other Services       Functional Status Assessment Patient has had a recent decline in their functional status and demonstrates the ability to make significant improvements in function in a reasonable and predictable amount of time.     Precautions / Restrictions Precautions Precautions: Fall Restrictions Weight Bearing Restrictions Per Provider Order: No      Mobility  Bed Mobility Overal bed mobility: Modified Independent Bed Mobility: Supine to Sit, Sit to Supine     Supine to sit: Modified independent (Device/Increase time) Sit to supine: Modified independent (Device/Increase  time)   General bed mobility comments: Mod ind for supine to long sit. pt taking extra time and effort to sit.    Transfers Overall transfer level: Needs assistance Equipment used: None Transfers: Bed to chair/wheelchair/BSC       Squat pivot transfers: Supervision     General transfer comment: supervision provided for safety. pt steady with pivot bed>chair. pt positions chair in "T" position for squat pivot transfer.    Ambulation/Gait                  Stairs            Wheelchair Mobility     Tilt Bed    Modified Rankin (Stroke Patients Only)       Balance Overall balance assessment: Needs assistance Sitting-balance support: Feet supported, No upper extremity supported Sitting balance-Leahy Scale: Good                                       Pertinent Vitals/Pain Pain Assessment Pain Assessment: 0-10 Pain Score: 7  Pain Location: Lt UE and LE's Pain Descriptors / Indicators: Discomfort, Sore Pain Intervention(s): Limited activity within patient's tolerance, Monitored during session, Repositioned    Home Living Family/patient expects to be discharged to:: Private residence Living Arrangements: Other relatives Available Help at Discharge: Available PRN/intermittently;Family Type of Home: House Home Access: Level entry       Home Layout: One level Home Equipment: Wheelchair - manual;Grab bars - tub/shower;Grab bars - toilet;Hospital bed;Wheelchair - power;Tub bench Additional Comments: 3L/min 02 at home at  night. Pt sits on the edge of tub to complete bathing. Pt reports not using prosthesis due to nerve pain    Prior Function Prior Level of Function : Independent/Modified Independent             Mobility Comments: Mod I using w/c via squat pivot ADLs Comments: independent in bADLs, family completes iADLs     Extremity/Trunk Assessment   Upper Extremity Assessment Upper Extremity Assessment: Overall WFL for tasks  assessed    Lower Extremity Assessment Lower Extremity Assessment: Overall WFL for tasks assessed (hx of Lt BKA)    Cervical / Trunk Assessment Cervical / Trunk Assessment: Normal  Communication   Communication Communication: No apparent difficulties  Cognition Arousal: Alert Behavior During Therapy: WFL for tasks assessed/performed Overall Cognitive Status: Within Functional Limits for tasks assessed                                 General Comments: pt pleasant following cues appropriately and no obvious impairments        General Comments      Exercises     Assessment/Plan    PT Assessment Patient needs continued PT services  PT Problem List Decreased strength;Decreased activity tolerance;Decreased balance;Decreased mobility;Decreased knowledge of use of DME;Decreased safety awareness;Decreased knowledge of precautions       PT Treatment Interventions DME instruction;Gait training;Stair training;Functional mobility training;Therapeutic activities;Therapeutic exercise;Balance training;Neuromuscular re-education;Patient/family education    PT Goals (Current goals can be found in the Care Plan section)  Acute Rehab PT Goals Patient Stated Goal: recover and get home PT Goal Formulation: All assessment and education complete, DC therapy Time For Goal Achievement: 03/25/23 Potential to Achieve Goals: Good    Frequency Min 1X/week     Co-evaluation               AM-PAC PT "6 Clicks" Mobility  Outcome Measure Help needed turning from your back to your side while in a flat bed without using bedrails?: None Help needed moving from lying on your back to sitting on the side of a flat bed without using bedrails?: None Help needed moving to and from a bed to a chair (including a wheelchair)?: None Help needed standing up from a chair using your arms (e.g., wheelchair or bedside chair)?: Total Help needed to walk in hospital room?: Total Help needed  climbing 3-5 steps with a railing? : Total 6 Click Score: 15    End of Session Equipment Utilized During Treatment: Gait belt Activity Tolerance: Patient tolerated treatment well Patient left: with call bell/phone within reach Nurse Communication: Mobility status PT Visit Diagnosis: Other abnormalities of gait and mobility (R26.89)    Time: 4098-1191 PT Time Calculation (min) (ACUTE ONLY): 28 min   Charges:   PT Evaluation $PT Eval Low Complexity: 1 Low PT Treatments $Therapeutic Activity: 8-22 mins PT General Charges $$ ACUTE PT VISIT: 1 Visit         Wynn Maudlin, DPT Acute Rehabilitation Services Office [phone]  03/11/23 3:21 PM

## 2023-03-11 NOTE — Progress Notes (Signed)
PHARMACY - ANTICOAGULATION CONSULT NOTE  Pharmacy Consult for heparin Indication: pulmonary embolus  Allergies  Allergen Reactions   Contrast Media [Iodinated Contrast Media] Anaphylaxis   Iodine Anaphylaxis   Sulfamethoxazole-Trimethoprim Nausea And Vomiting and Swelling    Other Reaction(s): Renal failure syndrome   Lisinopril Other (See Comments)    hyperkalemia  Other Reaction(s): Hyperkalemia   Varenicline     Other Reaction(s): Altered mental status   Ciprofloxacin Other (See Comments)    unknown   Metformin Other (See Comments)    Increased lactic acid  Other Reaction(s): Increased lactic acid level    Patient Measurements: Height: 5\' 9"  (175.3 cm) Weight: 62.5 kg (137 lb 12.6 oz) IBW/kg (Calculated) : 70.7 Heparin Dosing Weight: TBW  Vital Signs: Temp: 98.5 F (36.9 C) (12/24 1922) Temp Source: Oral (12/24 1922) BP: 120/62 (12/24 1922) Pulse Rate: 83 (12/24 1922)  Labs: Recent Labs    03/09/23 1422 03/09/23 2311 03/10/23 0038 03/10/23 0729 03/10/23 1611 03/10/23 2015 03/11/23 0240 03/11/23 1017 03/11/23 2032  HGB 16.4  --   --   --   --   --  12.9*  --   --   HCT 46.8  --   --   --   --   --  36.8*  --   --   PLT 396  --   --   --   --   --  320  --   --   APTT  --   --   --   --   --   --   --   --  45*  LABPROT  --   --   --   --  13.5  --   --   --   --   INR  --   --   --   --  1.0  --   --   --   --   HEPARINUNFRC  --   --   --   --   --  >1.10*  --  <0.10* 0.20*  CREATININE 2.65* 2.42*  --  2.25*  --   --  2.07*  --   --   CKTOTAL  --   --  44*  --   --   --   --   --   --   TROPONINIHS 33* 24*  --   --   --   --   --   --   --     Estimated Creatinine Clearance: 31 mL/min (A) (by C-G formula based on SCr of 2.07 mg/dL (H)).   Medical History: Past Medical History:  Diagnosis Date   Cancer (HCC)    lung cancer   COPD (chronic obstructive pulmonary disease) (HCC)    Diabetes mellitus without complication (HCC)    Hyperlipemia     Hypertension     Assessment: 57 YOM presenting with CP, VQ scan shows mismatch consistent with PE, he is not on anticoagulation PTA however lovenox 30mg  SQ administered 12/23 @0925  - will give augmented heparin bolus considering this  Heparin level < 0.1 is subtherapeutic with heparin running at 1300 units/hr. Hgb (12.9) has decreased significantly since 12/22. PLTs (320) are stable. Per RN, no report of pauses, issues with the line, or signs of bleeding.   Given supratherapeutic level yesterday after bolus, will give small bolus and conservative rate increase. Notably, phlebotomy had a difficult time convincing him to let them draw his heparin lab this morning, per  MD will hold off on transition to oral anticoagulant until procedures are complete.  12/24 2nd Shift Follow-Up: HL 0.20, aPTT 45. Both subtherapeutic  Goal of Therapy:  Heparin level 0.3-0.7 units/ml Monitor platelets by anticoagulation protocol: Yes   Plan:  Give heparin bolus 1800 units x1 Increase infusion to 1600 unit/hrs Follow up morning heparin level Monitor daily heparin level, CBC, and signs/symptoms of bleeding F/u long term AC plan  Lora Paula, PharmD  Please check AMION for all [facility] phone numbers After 10:00 PM, call Main Pharmacy [phone] 03/11/2023 9:12 PM

## 2023-03-11 NOTE — Progress Notes (Signed)
OT discharge Note  Patient Details Name: Martin Thomas MRN: [id number] DOB: May 11, 1955   Cancelled Treatment:    Reason Eval/Treat Not Completed: OT screened, no needs identified, will sign off (screen via [employer])  Mateo Flow 03/11/2023, 3:43 PM
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
## 2023-03-11 NOTE — Progress Notes (Signed)
PROGRESS NOTE    Martin Thomas  UJW:[id number] DOB: 01/14/1956 DOA: 03/09/2023 PCP: Default, Provider, MD   Brief Narrative: Martin Thomas is a 67 y.o. 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  Patient presented secondary to chest pain concerning for possible cardiac etiology.  D-dimer elevated and VQ scan obtained which was significant for a right upper lobe pulmonary embolism.  Hospitalization also complicated by elevated AST/ALT/bilirubin with negative imaging.  Cardiology and gastroenterology consulted..   Assessment/Plan:  Chest pain Patient is high risk for cardiac chest pain with history of CAD/NSTEMI in May status post PCI to RCA with DES.  Patient with persistent tachycardia on admission and troponin elevation to 24.  D-dimer ordered and is elevated at 0.84.  Cardiology was consulted for evaluation as well.  Perfusion scan ordered and is significant for right upper lobe pulmonary embolism.  Chest pain is currently resolved. Transthoracic Echocardiogram significant for reduced LVEF of 35-40% with regional wall motion abnormality. -Treat pulmonary embolism -Cardiology recommendations: pending today  CAD Recent PCI with stent placement. Patient is on aspirin and Brilinta as an outpatient. -Continue aspirin and Brilinta (confirmed in concurrent use of Heparin IV) per cardiology  AKI Baseline creatinine of about 1.3-1.6. Creatinine of 2.65 on admission. Presumed secondary to prerenal causes from hypovolemia secondary to nausea/vomiting. CT imaging without evidence of hydronephrosis or ureteral obstruction.  Patient started on IV fluids with some initial improvement of creatinine down to 2.07 today. -Repeat metabolic panel in a.m.  Elevated AST/ALT Elevated ALP hyperbilirubinemia No associated abdominal pain. AST/ALT trending up; bilirubin improved. Imaging significant for evidence  of cholelithiasis but without evidence of cholecystitis or biliary dilation. GI consulted and recommend holding MRCP in setting of AKI. Autoimmune workup initiated. Hepatitis A total antibody reactive with non-reactive IgM indicating likely immunity. GGT elevated. -GI recommendations: pending today -Follow-up IgG, ASMA IgG, Glia IgA/G and tTG IgA, mitochondrial antibodies  Hyponatremia Patient is euvolemic on exam. Concern for hypovolemic cause of hyponatremia no admission. Serum osmolality is slightly elevated at 300, however. Urine osmolality/sodium of 452/19 respectively. Sodium stable. -Recheck metabolic panel in AM -Recheck sodium this afternoon  Acute right upper lobe pulmonary embolism Patient with chest pain and d-dimer of 0.84. Pulmonary perfusion scan performed on 12/23 identifying evidence of pulmonary embolism.  Echocardiogram this admission significant for mildly reduced right ventricular systolic function with normal right ventricular size.  Patient started on heparin IV. -Continue heparin IV -Transition to Eliquis once ready for discharge  HFrEF Previous LVEF of 45 to 50% in May 2024 LVEF worsened this admission to 35 to 40% with regional wall motion abnormalities.  Right lower lobe pulmonary nodule Noted on CT abdomen imaging. Nodule measuring 14 mm with slightly decreased size compared to prior.  Hypercholesterolemia Patient is on Lipitor and Repatha as an outpatient. -Hold Lipitor secondary to elevated AST/ALT  Primary hypertension Patient is on losartan and metoprolol as an outpatient. Losartan held secondary to AKI. -Resume home metoprolol  Diabetes mellitus type 2 Patient is on Jardiance and insuline glargine as an outpatient. Diabetes is poorly controlled with a hemoglobin A1C of 8.2%. -Continue SSI -Will restart insulin glargine if blood sugars become uncontrolled   DVT prophylaxis: Heparin IV Code Status:   Code Status: Full Code Family Communication: None  at bedside Disposition Plan: Discharge home pending continued specialist recommendations and transition to oral anticoagulation   Consultants:  Cardiology [facility]  Procedures:  Transthoracic  Echocardiogram  Antimicrobials: None    Subjective: No concerns this morning. No recurrent chest pain. No dyspnea. No swelling noted.  Objective: BP (!) 102/52 (BP Location: Left Arm)   Pulse 81   Temp 98 F (36.7 C) (Oral)   Resp 18   Ht 5\' 9"  (1.753 m)   Wt 62.5 kg   SpO2 96%   BMI 20.35 kg/m   Examination:  General exam: Appears calm and comfortable Respiratory system: Clear to auscultation. Respiratory effort normal. Cardiovascular system: S1 & S2 heard, RRR. No murmurs. Gastrointestinal system: Abdomen is nondistended, soft and nontender. Normal bowel sounds heard. Central nervous system: Alert and oriented. No focal neurological deficits. Musculoskeletal: No edema. No calf tenderness Psychiatry: Judgement and insight appear normal. Mood & affect appropriate.    Data Reviewed: I have personally reviewed following labs and imaging studies   Last CBC Lab Results  Component Value Date   WBC 9.6 03/11/2023   HGB 12.9 (L) 03/11/2023   HCT 36.8 (L) 03/11/2023   MCV 92.9 03/11/2023   MCH 32.6 03/11/2023   RDW 14.3 03/11/2023   PLT 320 03/11/2023     Last metabolic panel Lab Results  Component Value Date   GLUCOSE 104 (H) 03/11/2023   NA 127 (L) 03/11/2023   K 3.5 03/11/2023   CL 93 (L) 03/11/2023   CO2 25 03/11/2023   BUN 59 (H) 03/11/2023   CREATININE 2.07 (H) 03/11/2023   GFRNONAA 35 (L) 03/11/2023   CALCIUM 7.9 (L) 03/11/2023   PHOS 3.3 07/23/2022   PROT 5.9 (L) 03/11/2023   ALBUMIN 2.9 (L) 03/11/2023   BILITOT 0.9 03/11/2023   ALKPHOS 230 (H) 03/11/2023   AST 480 (H) 03/11/2023   ALT 530 (H) 03/11/2023   ANIONGAP 9 03/11/2023     Creatinine Clearance: Estimated Creatinine Clearance: 31 mL/min (A) (by C-G formula based on SCr of 2.07  mg/dL (H)).  Recent Results (from the past 240 hours)  Resp panel by RT-PCR (RSV, Flu A&B, Covid) Anterior Nasal Swab     Status: None   Collection Time: 03/09/23  5:21 PM   Specimen: Anterior Nasal Swab  Result Value Ref Range Status   SARS Coronavirus 2 by RT PCR NEGATIVE NEGATIVE Final   Influenza A by PCR NEGATIVE NEGATIVE Final   Influenza B by PCR NEGATIVE NEGATIVE Final    Comment: (NOTE) The Xpert Xpress SARS-CoV-2/FLU/RSV plus assay is intended as an aid in the diagnosis of influenza from Nasopharyngeal swab specimens and should not be used as a sole basis for treatment. Nasal washings and aspirates are unacceptable for Xpert Xpress SARS-CoV-2/FLU/RSV testing.  Fact Sheet for Patients: BloggerCourse.com  Fact Sheet for Healthcare Providers: SeriousBroker.it  This test is not yet approved or cleared by the [place] FDA and has been authorized for detection and/or diagnosis of SARS-CoV-2 by FDA under an Emergency Use Authorization (EUA). This EUA will remain in effect (meaning this test can be used) for the duration of the COVID-19 declaration under Section 564(b)(1) of the Act, 21 U.S.C. section 360bbb-3(b)(1), unless the authorization is terminated or revoked.     Resp Syncytial Virus by PCR NEGATIVE NEGATIVE Final    Comment: (NOTE) Fact Sheet for Patients: BloggerCourse.com  Fact Sheet for Healthcare Providers: SeriousBroker.it  This test is not yet approved or cleared by the [place] FDA and has been authorized for detection and/or diagnosis of SARS-CoV-2 by FDA under an Emergency Use Authorization (EUA). This EUA will remain in effect (meaning this test can  be used) for the duration of the COVID-19 declaration under Section 564(b)(1) of the Act, 21 U.S.C. section 360bbb-3(b)(1), unless the authorization is terminated or revoked.  Performed at [facility], [address] [place], [place] [address]   Blood culture (routine x 2)     Status: None (Preliminary result)   Collection Time: 03/09/23  8:12 PM   Specimen: BLOOD RIGHT ARM  Result Value Ref Range Status   Specimen Description BLOOD RIGHT ARM  Final   Special Requests   Final    BOTTLES DRAWN AEROBIC AND ANAEROBIC Blood Culture adequate volume   Culture   Final    NO GROWTH 2 DAYS Performed at [facility], [address], [place], [place] [address]    Report Status PENDING  Incomplete  Blood culture (routine x 2)     Status: None (Preliminary result)   Collection Time: 03/09/23  8:12 PM   Specimen: BLOOD LEFT ARM  Result Value Ref Range Status   Specimen Description BLOOD LEFT ARM  Final   Special Requests   Final    BOTTLES DRAWN AEROBIC AND ANAEROBIC Blood Culture adequate volume   Culture   Final    NO GROWTH 2 DAYS Performed at [facility], [address] [place], [place] [address]    Report Status PENDING  Incomplete      Radiology Studies: ECHOCARDIOGRAM COMPLETE Result Date: 03/10/2023    ECHOCARDIOGRAM REPORT   Patient Name:   Martin Thomas Date of Exam: 03/10/2023 Medical Rec #:  [id number]        Height:       69.0 in Accession #:    [id number]       Weight:       186.0 lb Date of Birth:  11-29-1955         BSA:          2.003 m Patient Age:    67 years         BP:           128//67 mmHg Patient Gender: M                HR:           124 bpm. Exam Location:  Inpatient Procedure: 2D Echo, Cardiac Doppler and Color Doppler Indications:     Chest Pain R07.9  History:         Patient has prior history of Echocardiogram examinations, most                  recent 08/05/2022. COPD; Risk Factors:Hypertension and Diabetes.  Sonographer:     Darlys Gales Referring Phys:  [phone] JARED M GARDNER Diagnosing Phys: Clearnce Hasten IMPRESSIONS  1. Left ventricular ejection fraction, by estimation, is 35 to 40%. The left ventricle has moderately decreased  function. The left ventricle demonstrates regional wall motion abnormalities (see scoring diagram/findings for description). Indeterminate diastolic filling due to E-A fusion. Hypokineis of the inferior and inferoseptal walls.  2. Right ventricular systolic function is mildly reduced. The right ventricular size is normal.  3. The mitral valve is normal in structure. No evidence of mitral valve regurgitation. No evidence of mitral stenosis.  4. The aortic valve is normal in structure. There is moderate calcification of the aortic valve. Aortic valve regurgitation is mild to moderate. Aortic valve sclerosis/calcification is present, without any evidence of aortic stenosis.  5. The inferior vena cava is normal in size with greater  than 50% respiratory variability, suggesting right atrial pressure of 3 mmHg. FINDINGS  Left Ventricle: Left ventricular ejection fraction, by estimation, is 35 to 40%. The left ventricle has moderately decreased function. The left ventricle demonstrates regional wall motion abnormalities. The left ventricular internal cavity size was normal in size. There is no left ventricular hypertrophy. Indeterminate diastolic filling due to E-A fusion.  LV Wall Scoring: Hypokineis of the inferior and inferoseptal walls. Right Ventricle: The right ventricular size is normal. No increase in right ventricular wall thickness. Right ventricular systolic function is mildly reduced. Left Atrium: Left atrial size was normal in size. Right Atrium: Right atrial size was normal in size. Pericardium: There is no evidence of pericardial effusion. Mitral Valve: The mitral valve is normal in structure. No evidence of mitral valve regurgitation. No evidence of mitral valve stenosis. Tricuspid Valve: The tricuspid valve is normal in structure. Tricuspid valve regurgitation is not demonstrated. No evidence of tricuspid stenosis. Aortic Valve: The aortic valve is normal in structure. There is moderate calcification of the  aortic valve. Aortic valve regurgitation is mild to moderate. Aortic regurgitation PHT measures 323 msec. Aortic valve sclerosis/calcification is present, without any evidence of aortic stenosis. Aortic valve peak gradient measures 15.7 mmHg. Pulmonic Valve: The pulmonic valve was normal in structure. Pulmonic valve regurgitation is not visualized. No evidence of pulmonic stenosis. Aorta: The aortic root is normal in size and structure. Venous: The inferior vena cava is normal in size with greater than 50% respiratory variability, suggesting right atrial pressure of 3 mmHg. IAS/Shunts: No atrial level shunt detected by color flow Doppler.  LEFT VENTRICLE PLAX 2D LVIDd:         4.60 cm LVIDs:         3.70 cm LV PW:         0.90 cm LV IVS:        0.90 cm  RIGHT VENTRICLE RV S prime:     10.10 cm/s TAPSE (M-mode): 1.2 cm LEFT ATRIUM             Index       RIGHT ATRIUM          Index LA Vol (A2C):   16.2 ml 8.09 ml/m  RA Area:     7.56 cm LA Vol (A4C):   19.8 ml 9.88 ml/m  RA Volume:   12.20 ml 6.09 ml/m LA Biplane Vol: 19.0 ml 9.48 ml/m  AORTIC VALVE AV Vmax:      198.00 cm/s AV Peak Grad: 15.7 mmHg LVOT Vmax:    112.00 cm/s AI PHT:       323 msec  AORTA Ao Root diam: 2.70 cm MITRAL VALVE MV Area (PHT): 5.13 cm MV Decel Time: 148 msec MV E velocity: 126.00 cm/s Clearnce Hasten Electronically signed by Clearnce Hasten Signature Date/Time: 03/10/2023/1:13:42 PM    Final (Updated)    NM Pulmonary Perfusion Result Date: 03/10/2023 CLINICAL DATA:  Nonspecific chest pain, positive D-dimer EXAM: NUCLEAR MEDICINE PERFUSION LUNG SCAN TECHNIQUE: Perfusion images were obtained in multiple projections after intravenous injection of radiopharmaceutical. Ventilation scans intentionally deferred if perfusion scan and chest x-ray adequate for interpretation during COVID 19 epidemic. RADIOPHARMACEUTICALS:  4.0 mCi Tc-36m MAA IV COMPARISON:  03/09/2023, 11/28/2022 FINDINGS: Planar images of the lungs are obtained in multiple  projections during the perfusion exam. There is a perfusion defect involving the apical segment of the right upper lobe, best seen on the anterior and posterior projections. No corresponding abnormality on chest x-ray. Otherwise normal  perfusion throughout the remainder of the lung parenchyma. IMPRESSION: 1. Segmental perfusion defect apical segment right upper lobe without corresponding chest x-ray finding, consistent with pulmonary embolus. Critical Value/emergent results were called by telephone at the time of interpretation on 03/10/2023 at 1201 pm to provider DR Durwin Nora, who verbally acknowledged these results. Electronically Signed   By: Sharlet Salina M.D.   On: 03/10/2023 12:11   US Abdomen Limited RUQ (LIVER/GB) Result Date: 03/09/2023 CLINICAL DATA:  Transaminitis EXAM: ULTRASOUND ABDOMEN LIMITED RIGHT UPPER QUADRANT COMPARISON:  CT today FINDINGS: Gallbladder: Small layering stones. No wall thickening or sonographic Murphy sign. Common bile duct: Diameter: Normal caliber, 5-6 mm. Liver: No focal lesion identified. Within normal limits in parenchymal echogenicity. Portal vein is patent on color Doppler imaging with normal direction of blood flow towards the liver. Other: None. IMPRESSION: Cholelithiasis.  No sonographic evidence of acute cholecystitis. Electronically Signed   By: Charlett Nose M.D.   On: 03/09/2023 22:22   CT ABDOMEN PELVIS WO CONTRAST Result Date: 03/09/2023 CLINICAL DATA:  Abdominal pain, vomiting, elevated LFTs. EXAM: CT ABDOMEN AND PELVIS WITHOUT CONTRAST TECHNIQUE: Multidetector CT imaging of the abdomen and pelvis was performed following the standard protocol without IV contrast. RADIATION DOSE REDUCTION: This exam was performed according to the departmental dose-optimization program which includes automated exposure control, adjustment of the mA and/or kV according to patient size and/or use of iterative reconstruction technique. COMPARISON:  None Available. FINDINGS: Lower  chest: Calcifications crash that extensive calcifications throughout the visualized right and left circumflex coronary arteries. Right lower lobe pulmonary nodule measures 1.4 cm on image 26. this compares to 1.7 cm on prior PET CT from 11/28/2022. No effusions. Hepatobiliary: Small layering gallstones within the gallbladder. No focal hepatic abnormality or biliary ductal dilatation. Pancreas: No focal abnormality or ductal dilatation. Spleen: No focal abnormality.  Normal size. Adrenals/Urinary Tract: No adrenal abnormality. No focal renal abnormality. No stones or hydronephrosis. Urinary bladder is unremarkable. Stomach/Bowel: Sigmoid diverticulosis. No active diverticulitis. Stomach and small bowel decompressed, unremarkable. Vascular/Lymphatic: Diffuse aortoiliac atherosclerosis. No evidence of aneurysm or adenopathy. Reproductive: No visible focal abnormality. Other: No free fluid or free air. Musculoskeletal: No acute bony abnormality. IMPRESSION: 1.4 cm right lower lobe pulmonary nodule, stable or slightly decreased in size since prior PET CT. Small layering gallstones. No evidence of acute cholecystitis or biliary ductal dilatation. Coronary artery disease, aortoiliac atherosclerosis. Sigmoid diverticulosis. No acute findings. Electronically Signed   By: Charlett Nose M.D.   On: 03/09/2023 20:59   DG Chest Port 1 View Result Date: 03/09/2023 CLINICAL DATA:  Pain.  Chest pain and shortness of breath. EXAM: PORTABLE CHEST 1 VIEW COMPARISON:  08/04/2022 FINDINGS: Normal heart size and pulmonary vascularity. No focal airspace disease or consolidation in the lungs. No blunting of costophrenic angles. No pneumothorax. Mediastinal contours appear intact. Calcification of the aorta. IMPRESSION: No active disease. Electronically Signed   By: Burman Nieves M.D.   On: 03/09/2023 18:52      LOS: 1 day    Jacquelin Hawking, MD [facility] 03/11/2023, 8:35 AM   If 7PM-7AM, please contact  night-coverage [email or web address]

## 2023-03-11 NOTE — Plan of Care (Signed)
  Problem: Education: Goal: Ability to describe self-care measures that may prevent or decrease complications (Diabetes Survival Skills Education) will improve Outcome: Progressing Goal: Individualized Educational Video(s) Outcome: Progressing   Problem: Coping: Goal: Ability to adjust to condition or change in health will improve Outcome: Progressing   Problem: Fluid Volume: Goal: Ability to maintain a balanced intake and output will improve Outcome: Progressing   Problem: Health Behavior/Discharge Planning: Goal: Ability to identify and utilize available resources and services will improve Outcome: Progressing Goal: Ability to manage health-related needs will improve Outcome: Progressing   Problem: Metabolic: Goal: Ability to maintain appropriate glucose levels will improve Outcome: Progressing   Problem: Nutritional: Goal: Maintenance of adequate nutrition will improve Outcome: Progressing Goal: Progress toward achieving an optimal weight will improve Outcome: Progressing   Problem: Skin Integrity: Goal: Risk for impaired skin integrity will decrease Outcome: Progressing   Problem: Tissue Perfusion: Goal: Adequacy of tissue perfusion will improve Outcome: Progressing   Problem: Education: Goal: Understanding of cardiac disease, CV risk reduction, and recovery process will improve Outcome: Progressing Goal: Individualized Educational Video(s) Outcome: Progressing   Problem: Activity: Goal: Ability to tolerate increased activity will improve Outcome: Progressing   Problem: Cardiac: Goal: Ability to achieve and maintain adequate cardiovascular perfusion will improve Outcome: Progressing   Problem: Health Behavior/Discharge Planning: Goal: Ability to safely manage health-related needs after discharge will improve Outcome: Progressing   Problem: Education: Goal: Knowledge of General Education information will improve Description: Including pain rating scale,  medication(s)/side effects and non-pharmacologic comfort measures Outcome: Progressing   Problem: Health Behavior/Discharge Planning: Goal: Ability to manage health-related needs will improve Outcome: Progressing   Problem: Clinical Measurements: Goal: Ability to maintain clinical measurements within normal limits will improve Outcome: Progressing Goal: Will remain free from infection Outcome: Progressing Goal: Diagnostic test results will improve Outcome: Progressing Goal: Respiratory complications will improve Outcome: Progressing Goal: Cardiovascular complication will be avoided Outcome: Progressing   Problem: Activity: Goal: Risk for activity intolerance will decrease Outcome: Progressing   Problem: Nutrition: Goal: Adequate nutrition will be maintained Outcome: Progressing   Problem: Coping: Goal: Level of anxiety will decrease Outcome: Progressing   Problem: Elimination: Goal: Will not experience complications related to bowel motility Outcome: Progressing Goal: Will not experience complications related to urinary retention Outcome: Progressing   Problem: Pain Management: Goal: General experience of comfort will improve Outcome: Progressing   Problem: Safety: Goal: Ability to remain free from injury will improve Outcome: Progressing   Problem: Skin Integrity: Goal: Risk for impaired skin integrity will decrease Outcome: Progressing

## 2023-03-12 DIAGNOSIS — N1831 Chronic kidney disease, stage 3a: Secondary | ICD-10-CM

## 2023-03-12 DIAGNOSIS — I519 Heart disease, unspecified: Secondary | ICD-10-CM | POA: Diagnosis not present

## 2023-03-12 DIAGNOSIS — R7989 Other specified abnormal findings of blood chemistry: Secondary | ICD-10-CM | POA: Diagnosis not present

## 2023-03-12 DIAGNOSIS — R079 Chest pain, unspecified: Secondary | ICD-10-CM | POA: Diagnosis not present

## 2023-03-12 DIAGNOSIS — N179 Acute kidney failure, unspecified: Secondary | ICD-10-CM | POA: Diagnosis not present

## 2023-03-12 DIAGNOSIS — R000 Tachycardia, unspecified: Secondary | ICD-10-CM | POA: Diagnosis not present

## 2023-03-12 DIAGNOSIS — I2699 Other pulmonary embolism without acute cor pulmonale: Secondary | ICD-10-CM | POA: Diagnosis not present

## 2023-03-12 LAB — GLUCOSE, CAPILLARY
Glucose-Capillary: 165 mg/dL — ABNORMAL HIGH (ref 70–99)
Glucose-Capillary: 190 mg/dL — ABNORMAL HIGH (ref 70–99)
Glucose-Capillary: 197 mg/dL — ABNORMAL HIGH (ref 70–99)
Glucose-Capillary: 200 mg/dL — ABNORMAL HIGH (ref 70–99)

## 2023-03-12 LAB — COMPREHENSIVE METABOLIC PANEL
ALT: 273 U/L — ABNORMAL HIGH (ref 0–44)
AST: 134 U/L — ABNORMAL HIGH (ref 15–41)
Albumin: 2.4 g/dL — ABNORMAL LOW (ref 3.5–5.0)
Alkaline Phosphatase: 219 U/L — ABNORMAL HIGH (ref 38–126)
Anion gap: 14 (ref 5–15)
BUN: 42 mg/dL — ABNORMAL HIGH (ref 8–23)
CO2: 25 mmol/L (ref 22–32)
Calcium: 8.2 mg/dL — ABNORMAL LOW (ref 8.9–10.3)
Chloride: 97 mmol/L — ABNORMAL LOW (ref 98–111)
Creatinine, Ser: 1.68 mg/dL — ABNORMAL HIGH (ref 0.61–1.24)
GFR, Estimated: 45 mL/min — ABNORMAL LOW (ref >60)
Glucose, Bld: 195 mg/dL — ABNORMAL HIGH (ref 70–99)
Potassium: 4.2 mmol/L (ref 3.5–5.1)
Sodium: 136 mmol/L (ref 135–145)
Total Bilirubin: 0.7 mg/dL (ref <1.2)
Total Protein: 5.2 g/dL — ABNORMAL LOW (ref 6.5–8.1)

## 2023-03-12 LAB — CBC
HCT: 33.4 % — ABNORMAL LOW (ref 39.0–52.0)
Hemoglobin: 11.5 g/dL — ABNORMAL LOW (ref 13.0–17.0)
MCH: 32.3 pg (ref 26.0–34.0)
MCHC: 34.4 g/dL (ref 30.0–36.0)
MCV: 93.8 fL (ref 80.0–100.0)
Platelets: 288 10*3/uL (ref 150–400)
RBC: 3.56 MIL/uL — ABNORMAL LOW (ref 4.22–5.81)
RDW: 14.3 % (ref 11.5–15.5)
WBC: 9.6 10*3/uL (ref 4.0–10.5)
nRBC: 0 % (ref 0.0–0.2)

## 2023-03-12 LAB — HEPARIN LEVEL (UNFRACTIONATED)
Heparin Unfractionated: 0.27 [IU]/mL — ABNORMAL LOW (ref 0.30–0.70)
Heparin Unfractionated: 0.48 [IU]/mL (ref 0.30–0.70)
Heparin Unfractionated: 0.83 [IU]/mL — ABNORMAL HIGH (ref 0.30–0.70)

## 2023-03-12 MED ORDER — MORPHINE SULFATE (PF) 2 MG/ML IV SOLN: 2.0000 mg | INTRAVENOUS | Status: DC | PRN

## 2023-03-12 NOTE — Progress Notes (Signed)
PROGRESS NOTE    Martin Thomas  OZH:[id number] DOB: [birth year] DOA: 03/09/2023 PCP: Default, Provider, MD   Brief Narrative: Martin Thomas is a 67 y.o. 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  Patient presented secondary to chest pain concerning for possible cardiac etiology.  D-dimer elevated and VQ scan obtained which was significant for a right upper lobe pulmonary embolism.  Hospitalization also complicated by elevated AST/ALT/bilirubin with negative imaging.  Cardiology and gastroenterology consulted..   Assessment/Plan:  Chest pain Patient is high risk for cardiac chest pain with history of CAD/NSTEMI in May status post PCI to RCA with DES.  Patient with persistent tachycardia on admission and troponin elevation to 24.  D-dimer ordered and is elevated at 0.84.  Cardiology was consulted for evaluation as well.  Perfusion scan ordered and is significant for right upper lobe pulmonary embolism.  Chest pain is currently resolved. Transthoracic Echocardiogram significant for reduced LVEF of 35-40% with regional wall motion abnormality. -Treat pulmonary embolism -Cardiology recommendations: pending today  CAD Recent PCI with stent placement. Patient is on aspirin and Brilinta as an outpatient. -Continue aspirin and Brilinta (confirmed in concurrent use of Heparin IV) per cardiology  AKI Baseline creatinine of about 1.3-1.6. Creatinine of 2.65 on admission. Presumed secondary to prerenal causes from hypovolemia secondary to nausea/vomiting. CT imaging without evidence of hydronephrosis or ureteral obstruction.  Patient started on IV fluids with some initial improvement of creatinine down to 2.07 today. -Repeat metabolic panel in a.m.  Elevated AST/ALT Elevated ALP hyperbilirubinemia No associated abdominal pain. AST/ALT trending up; bilirubin improved. Imaging significant for evidence  of cholelithiasis but without evidence of cholecystitis or biliary dilation. GI consulted and recommend holding MRCP in setting of AKI. Autoimmune workup initiated. Hepatitis A total antibody reactive with non-reactive IgM indicating likely immunity. GGT elevated. Autoimmune workup negative. -GI recommendations: hoping for improved renal function and consideration of MRCP. Possibly drug induced liver damage.  Hyponatremia Patient is euvolemic on exam. Concern for hypovolemic cause of hyponatremia no admission. Serum osmolality is slightly elevated at 300, however. Urine osmolality/sodium of 452/19 respectively. Sodium improved/stable. -Follow-up CMP today  Acute right upper lobe pulmonary embolism Patient with chest pain and d-dimer of 0.84. Pulmonary perfusion scan performed on 12/23 identifying evidence of pulmonary embolism.  Echocardiogram this admission significant for mildly reduced right ventricular systolic function with normal right ventricular size.  Patient started on heparin IV. -Continue heparin IV -Transition to Eliquis once ready for discharge  HFrEF Previous LVEF of 45 to 50% in May 2024 LVEF worsened this admission to 35 to 40% with regional wall motion abnormalities.  Right lower lobe pulmonary nodule Noted on CT abdomen imaging. Nodule measuring 14 mm with slightly decreased size compared to prior.  Hypercholesterolemia Patient is on Lipitor and Repatha as an outpatient. -Hold Lipitor secondary to elevated AST/ALT  Primary hypertension Patient is on losartan and metoprolol as an outpatient. Losartan held secondary to AKI. -Continue metoprolol  Diabetes mellitus type 2 Patient is on Jardiance and insuline glargine as an outpatient. Diabetes is poorly controlled with a hemoglobin A1C of 8.2%. -Continue SSI -Will restart insulin glargine if blood sugars become uncontrolled   DVT prophylaxis: Heparin IV Code Status:   Code Status: Full Code Family Communication: None  at bedside Disposition Plan: Discharge home pending continued specialist recommendations and transition to oral anticoagulation   Consultants:  Cardiology [facility]  Procedures:  Transthoracic Echocardiogram  Antimicrobials:  None    Subjective: No chest pain or dyspnea. No issues from overnight.  Objective: BP (!) 116/41 (BP Location: Right Arm)   Pulse 73   Temp 98 F (36.7 C) (Oral)   Resp 20   Ht 5\' 9"  (1.753 m)   Wt 62.7 kg   SpO2 96%   BMI 20.41 kg/m   Examination:  General exam: Appears calm and comfortable Respiratory system: Clear to auscultation. Respiratory effort normal. Cardiovascular system: S1 & S2 heard, RRR. No murmurs. Gastrointestinal system: Abdomen is nondistended, soft and nontender. Normal bowel sounds heard. Central nervous system: Alert and oriented. No focal neurological deficits. Musculoskeletal: No edema. No calf tenderness. Left BKA. Psychiatry: Judgement and insight appear normal. Mood & affect appropriate.    Data Reviewed: I have personally reviewed following labs and imaging studies   Last CBC Lab Results  Component Value Date   WBC 9.6 03/12/2023   HGB 11.5 (L) 03/12/2023   HCT 33.4 (L) 03/12/2023   MCV 93.8 03/12/2023   MCH 32.3 03/12/2023   RDW 14.3 03/12/2023   PLT 288 03/12/2023     Last metabolic panel Lab Results  Component Value Date   GLUCOSE 104 (H) 03/11/2023   NA 132 (L) 03/11/2023   K 3.5 03/11/2023   CL 93 (L) 03/11/2023   CO2 25 03/11/2023   BUN 59 (H) 03/11/2023   CREATININE 2.07 (H) 03/11/2023   GFRNONAA 35 (L) 03/11/2023   CALCIUM 7.9 (L) 03/11/2023   PHOS 3.3 07/23/2022   PROT 5.9 (L) 03/11/2023   ALBUMIN 2.9 (L) 03/11/2023   BILITOT 0.9 03/11/2023   ALKPHOS 230 (H) 03/11/2023   AST 480 (H) 03/11/2023   ALT 530 (H) 03/11/2023   ANIONGAP 9 03/11/2023     Creatinine Clearance: Estimated Creatinine Clearance: 31.1 mL/min (A) (by C-G formula based on SCr of 2.07 mg/dL  (H)).  Recent Results (from the past 240 hours)  Resp panel by RT-PCR (RSV, Flu A&B, Covid) Anterior Nasal Swab     Status: None   Collection Time: 03/09/23  5:21 PM   Specimen: Anterior Nasal Swab  Result Value Ref Range Status   SARS Coronavirus 2 by RT PCR NEGATIVE NEGATIVE Final   Influenza A by PCR NEGATIVE NEGATIVE Final   Influenza B by PCR NEGATIVE NEGATIVE Final    Comment: (NOTE) The Xpert Xpress SARS-CoV-2/FLU/RSV plus assay is intended as an aid in the diagnosis of influenza from Nasopharyngeal swab specimens and should not be used as a sole basis for treatment. Nasal washings and aspirates are unacceptable for Xpert Xpress SARS-CoV-2/FLU/RSV testing.  Fact Sheet for Patients: BloggerCourse.com  Fact Sheet for Healthcare Providers: SeriousBroker.it  This test is not yet approved or cleared by the [place] FDA and has been authorized for detection and/or diagnosis of SARS-CoV-2 by FDA under an Emergency Use Authorization (EUA). This EUA will remain in effect (meaning this test can be used) for the duration of the COVID-19 declaration under Section 564(b)(1) of the Act, 21 U.S.C. section 360bbb-3(b)(1), unless the authorization is terminated or revoked.     Resp Syncytial Virus by PCR NEGATIVE NEGATIVE Final    Comment: (NOTE) Fact Sheet for Patients: BloggerCourse.com  Fact Sheet for Healthcare Providers: SeriousBroker.it  This test is not yet approved or cleared by the [place] FDA and has been authorized for detection and/or diagnosis of SARS-CoV-2 by FDA under an Emergency Use Authorization (EUA). This EUA will remain in effect (meaning this test can be used) for the duration  of the COVID-19 declaration under Section 564(b)(1) of the Act, 21 U.S.C. section 360bbb-3(b)(1), unless the authorization is terminated or revoked.  Performed at [facility], [address], [place], [place] [address]   Blood culture (routine x 2)     Status: None (Preliminary result)   Collection Time: 03/09/23  8:12 PM   Specimen: BLOOD RIGHT ARM  Result Value Ref Range Status   Specimen Description BLOOD RIGHT ARM  Final   Special Requests   Final    BOTTLES DRAWN AEROBIC AND ANAEROBIC Blood Culture adequate volume   Culture   Final    NO GROWTH 3 DAYS Performed at [facility], [address], [place], [place] [address]    Report Status PENDING  Incomplete  Blood culture (routine x 2)     Status: None (Preliminary result)   Collection Time: 03/09/23  8:12 PM   Specimen: BLOOD LEFT ARM  Result Value Ref Range Status   Specimen Description BLOOD LEFT ARM  Final   Special Requests   Final    BOTTLES DRAWN AEROBIC AND ANAEROBIC Blood Culture adequate volume   Culture   Final    NO GROWTH 3 DAYS Performed at [facility], [address] [place], [place] [address]    Report Status PENDING  Incomplete      Radiology Studies: ECHOCARDIOGRAM COMPLETE Result Date: 03/10/2023    ECHOCARDIOGRAM REPORT   Patient Name:   LENVIL GABOUREL Date of Exam: 03/10/2023 Medical Rec #:  [id number]        Height:       69.0 in Accession #:    [id number]       Weight:       186.0 lb Date of Birth:  [birth year]         BSA:          2.003 m Patient Age:    67 years         BP:           128//67 mmHg Patient Gender: M                HR:           124 bpm. Exam Location:  Inpatient Procedure: 2D Echo, Cardiac Doppler and Color Doppler Indications:     Chest Pain R07.9  History:         Patient has prior history of Echocardiogram examinations, most                  recent 08/05/2022. COPD; Risk Factors:Hypertension and Diabetes.  Sonographer:     Darlys Gales Referring Phys:  [phone] JARED M GARDNER Diagnosing Phys: Clearnce Hasten IMPRESSIONS  1. Left ventricular ejection fraction, by estimation, is 35 to 40%. The left ventricle has moderately decreased function.  The left ventricle demonstrates regional wall motion abnormalities (see scoring diagram/findings for description). Indeterminate diastolic filling due to E-A fusion. Hypokineis of the inferior and inferoseptal walls.  2. Right ventricular systolic function is mildly reduced. The right ventricular size is normal.  3. The mitral valve is normal in structure. No evidence of mitral valve regurgitation. No evidence of mitral stenosis.  4. The aortic valve is normal in structure. There is moderate calcification of the aortic valve. Aortic valve regurgitation is mild to moderate. Aortic valve sclerosis/calcification is present, without any evidence of aortic stenosis.  5. The inferior vena cava is normal in size with greater than 50% respiratory variability, suggesting  right atrial pressure of 3 mmHg. FINDINGS  Left Ventricle: Left ventricular ejection fraction, by estimation, is 35 to 40%. The left ventricle has moderately decreased function. The left ventricle demonstrates regional wall motion abnormalities. The left ventricular internal cavity size was normal in size. There is no left ventricular hypertrophy. Indeterminate diastolic filling due to E-A fusion.  LV Wall Scoring: Hypokineis of the inferior and inferoseptal walls. Right Ventricle: The right ventricular size is normal. No increase in right ventricular wall thickness. Right ventricular systolic function is mildly reduced. Left Atrium: Left atrial size was normal in size. Right Atrium: Right atrial size was normal in size. Pericardium: There is no evidence of pericardial effusion. Mitral Valve: The mitral valve is normal in structure. No evidence of mitral valve regurgitation. No evidence of mitral valve stenosis. Tricuspid Valve: The tricuspid valve is normal in structure. Tricuspid valve regurgitation is not demonstrated. No evidence of tricuspid stenosis. Aortic Valve: The aortic valve is normal in structure. There is moderate calcification of the aortic  valve. Aortic valve regurgitation is mild to moderate. Aortic regurgitation PHT measures 323 msec. Aortic valve sclerosis/calcification is present, without any evidence of aortic stenosis. Aortic valve peak gradient measures 15.7 mmHg. Pulmonic Valve: The pulmonic valve was normal in structure. Pulmonic valve regurgitation is not visualized. No evidence of pulmonic stenosis. Aorta: The aortic root is normal in size and structure. Venous: The inferior vena cava is normal in size with greater than 50% respiratory variability, suggesting right atrial pressure of 3 mmHg. IAS/Shunts: No atrial level shunt detected by color flow Doppler.  LEFT VENTRICLE PLAX 2D LVIDd:         4.60 cm LVIDs:         3.70 cm LV PW:         0.90 cm LV IVS:        0.90 cm  RIGHT VENTRICLE RV S prime:     10.10 cm/s TAPSE (M-mode): 1.2 cm LEFT ATRIUM             Index       RIGHT ATRIUM          Index LA Vol (A2C):   16.2 ml 8.09 ml/m  RA Area:     7.56 cm LA Vol (A4C):   19.8 ml 9.88 ml/m  RA Volume:   12.20 ml 6.09 ml/m LA Biplane Vol: 19.0 ml 9.48 ml/m  AORTIC VALVE AV Vmax:      198.00 cm/s AV Peak Grad: 15.7 mmHg LVOT Vmax:    112.00 cm/s AI PHT:       323 msec  AORTA Ao Root diam: 2.70 cm MITRAL VALVE MV Area (PHT): 5.13 cm MV Decel Time: 148 msec MV E velocity: 126.00 cm/s Clearnce Hasten Electronically signed by Clearnce Hasten Signature Date/Time: 03/10/2023/1:13:42 PM    Final (Updated)    NM Pulmonary Perfusion Result Date: 03/10/2023 CLINICAL DATA:  Nonspecific chest pain, positive D-dimer EXAM: NUCLEAR MEDICINE PERFUSION LUNG SCAN TECHNIQUE: Perfusion images were obtained in multiple projections after intravenous injection of radiopharmaceutical. Ventilation scans intentionally deferred if perfusion scan and chest x-ray adequate for interpretation during COVID 19 epidemic. RADIOPHARMACEUTICALS:  4.0 mCi Tc-63m MAA IV COMPARISON:  03/09/2023, 11/28/2022 FINDINGS: Planar images of the lungs are obtained in multiple  projections during the perfusion exam. There is a perfusion defect involving the apical segment of the right upper lobe, best seen on the anterior and posterior projections. No corresponding abnormality on chest x-ray. Otherwise normal perfusion throughout the remainder of  the lung parenchyma. IMPRESSION: 1. Segmental perfusion defect apical segment right upper lobe without corresponding chest x-ray finding, consistent with pulmonary embolus. Critical Value/emergent results were called by telephone at the time of interpretation on 03/10/2023 at 1201 pm to provider DR Durwin Nora, who verbally acknowledged these results. Electronically Signed   By: Sharlet Salina M.D.   On: 03/10/2023 12:11      LOS: 2 days    Jacquelin Hawking, MD [facility] 03/12/2023, 9:08 AM   If 7PM-7AM, please contact night-coverage [email or web address]
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
## 2023-03-12 NOTE — Progress Notes (Addendum)
PHARMACY - ANTICOAGULATION CONSULT NOTE  Pharmacy Consult for heparin Indication: pulmonary embolus  Allergies  Allergen Reactions   Contrast Media [Iodinated Contrast Media] Anaphylaxis   Iodine Anaphylaxis   Sulfamethoxazole-Trimethoprim Nausea And Vomiting and Swelling    Other Reaction(s): Renal failure syndrome   Lisinopril Other (See Comments)    hyperkalemia  Other Reaction(s): Hyperkalemia   Varenicline     Other Reaction(s): Altered mental status   Ciprofloxacin Other (See Comments)    unknown   Metformin Other (See Comments)    Increased lactic acid  Other Reaction(s): Increased lactic acid level    Patient Measurements: Height: 5\' 9"  (175.3 cm) Weight: 62.7 kg (138 lb 3.7 oz) IBW/kg (Calculated) : 70.7 Heparin Dosing Weight: TBW  Vital Signs: Temp: 98 F (36.7 C) (12/25 0729) Temp Source: Oral (12/25 0729) BP: 106/43 (12/25 1421) Pulse Rate: 100 (12/25 1422)  Labs: Recent Labs    03/09/23 2311 03/10/23 0038 03/10/23 0729 03/10/23 1611 03/10/23 2015 03/11/23 0240 03/11/23 1017 03/11/23 2032 03/12/23 0230 03/12/23 1340  HGB  --   --   --   --   --  12.9*  --   --  11.5*  --   HCT  --   --   --   --   --  36.8*  --   --  33.4*  --   PLT  --   --   --   --   --  320  --   --  288  --   APTT  --   --   --   --   --   --   --  45*  --   --   LABPROT  --   --   --  13.5  --   --   --   --   --   --   INR  --   --   --  1.0  --   --   --   --   --   --   HEPARINUNFRC  --   --   --   --    < >  --    < > 0.20* 0.27* 0.48  CREATININE 2.42*  --  2.25*  --   --  2.07*  --   --   --   --   CKTOTAL  --  44*  --   --   --   --   --   --   --   --   TROPONINIHS 24*  --   --   --   --   --   --   --   --   --    < > = values in this interval not displayed.    Estimated Creatinine Clearance: 31.1 mL/min (A) (by C-G formula based on SCr of 2.07 mg/dL (H)).   Medical History: Past Medical History:  Diagnosis Date   Cancer (HCC)    lung cancer   COPD  (chronic obstructive pulmonary disease) (HCC)    Diabetes mellitus without complication (HCC)    Hyperlipemia    Hypertension     Assessment: 33 YOM presenting with CP, VQ scan shows mismatch consistent with PE, he is not on anticoagulation PTA however lovenox 30mg  SQ administered 12/23 @0925  - will give augmented heparin bolus considering this. Per MD will hold off on transition to oral anticoagulant until procedures are complete.  12/25 PM: Heparin level 0.48, therapeutic on 1800  units/hr. No issues with heparin infusion running or signs of bleeding per RN. CBC stable (Hgb 11.5, PLT 288).   Goal of Therapy:  Heparin level 0.3-0.7 units/ml Monitor platelets by anticoagulation protocol: Yes   Plan:  Continue heparin 1800 units/hr Monitor daily heparin level, CBC, and signs/symptoms of bleeding F/u long term AC plan  ADDENDUM 2100: Heparin level 0.83, supra-therapeutic on 1800 units/hr. No signs of bleeding per RN. Will decrease heparin infusion to 1700 units/hr. Recheck heparin level in 6 hours.   Enos Fling, PharmD PGY-1 Acute Care Pharmacy Resident 03/12/2023 2:23 PM

## 2023-03-12 NOTE — Progress Notes (Deleted)
PHARMACY - ANTICOAGULATION CONSULT NOTE  Pharmacy Consult for heparin Indication: pulmonary embolus  Allergies  Allergen Reactions   Contrast Media [Iodinated Contrast Media] Anaphylaxis   Iodine Anaphylaxis   Sulfamethoxazole-Trimethoprim Nausea And Vomiting and Swelling    Other Reaction(s): Renal failure syndrome   Lisinopril Other (See Comments)    hyperkalemia  Other Reaction(s): Hyperkalemia   Varenicline     Other Reaction(s): Altered mental status   Ciprofloxacin Other (See Comments)    unknown   Metformin Other (See Comments)    Increased lactic acid  Other Reaction(s): Increased lactic acid level    Patient Measurements: Height: 5\' 9"  (175.3 cm) Weight: 62.7 kg (138 lb 3.7 oz) IBW/kg (Calculated) : 70.7 Heparin Dosing Weight: TBW  Vital Signs: Temp: 98 F (36.7 C) (12/25 0729) Temp Source: Oral (12/25 0729) BP: 106/43 (12/25 1421) Pulse Rate: 100 (12/25 1422)  Labs: Recent Labs    03/09/23 2311 03/10/23 0038 03/10/23 0729 03/10/23 1611 03/10/23 2015 03/11/23 0240 03/11/23 1017 03/11/23 2032 03/12/23 0230 03/12/23 1340  HGB  --   --   --   --   --  12.9*  --   --  11.5*  --   HCT  --   --   --   --   --  36.8*  --   --  33.4*  --   PLT  --   --   --   --   --  320  --   --  288  --   APTT  --   --   --   --   --   --   --  45*  --   --   LABPROT  --   --   --  13.5  --   --   --   --   --   --   INR  --   --   --  1.0  --   --   --   --   --   --   HEPARINUNFRC  --   --   --   --    < >  --    < > 0.20* 0.27* 0.48  CREATININE 2.42*  --  2.25*  --   --  2.07*  --   --   --   --   CKTOTAL  --  44*  --   --   --   --   --   --   --   --   TROPONINIHS 24*  --   --   --   --   --   --   --   --   --    < > = values in this interval not displayed.    Estimated Creatinine Clearance: 31.1 mL/min (A) (by C-G formula based on SCr of 2.07 mg/dL (H)).   Assessment: 71 YOM presenting with CP, VQ scan shows mismatch consistent with PE, he is not on  anticoagulation PTA however lovenox 30mg  SQ administered 12/23 @0925  - will give augmented heparin bolus considering this  Heparin level therapeutic (0.48) on infusion at 1600 units/hr. No bleeding noted.  Goal of Therapy:  Heparin level 0.3-0.7 units/ml Monitor platelets by anticoagulation protocol: Yes   Plan:  Continue heparin infusion at 1800 unit/hrs F/u 6 hr confirmatory heparin level  Christoper Fabian, PharmD, BCPS Please see amion for complete clinical pharmacist phone list 03/12/2023 2:23 PM

## 2023-03-12 NOTE — Plan of Care (Signed)
Pt continues to have bleeding areas to arms. Dressings applied to open areas

## 2023-03-12 NOTE — Progress Notes (Signed)
PHARMACY - ANTICOAGULATION CONSULT NOTE  Pharmacy Consult for heparin Indication: pulmonary embolus  Labs: Recent Labs    03/09/23 1422 03/09/23 2311 03/10/23 0038 03/10/23 0729 03/10/23 1611 03/10/23 2015 03/11/23 0240 03/11/23 1017 03/11/23 2032 03/12/23 0230  HGB 16.4  --   --   --   --   --  12.9*  --   --  11.5*  HCT 46.8  --   --   --   --   --  36.8*  --   --  33.4*  PLT 396  --   --   --   --   --  320  --   --  288  APTT  --   --   --   --   --   --   --   --  45*  --   LABPROT  --   --   --   --  13.5  --   --   --   --   --   INR  --   --   --   --  1.0  --   --   --   --   --   HEPARINUNFRC  --   --   --   --   --    < >  --  <0.10* 0.20* 0.27*  CREATININE 2.65* 2.42*  --  2.25*  --   --  2.07*  --   --   --   CKTOTAL  --   --  44*  --   --   --   --   --   --   --   TROPONINIHS 33* 24*  --   --   --   --   --   --   --   --    < > = values in this interval not displayed.   Assessment: 67yo male subtherapeutic on heparin after rate change; no signs of bleeding per RN but she does note multiple IV line occlusions.  Goal of Therapy:  Heparin level 0.3-0.7 units/ml   Plan:  Increase heparin infusion by 10% to 1800 units/hr. Check level in 8 hours.   Vernard Gambles, PharmD, BCPS 03/12/2023 4:29 AM

## 2023-03-12 NOTE — Plan of Care (Signed)
  Problem: Education: Goal: Ability to describe self-care measures that may prevent or decrease complications (Diabetes Survival Skills Education) will improve Outcome: Progressing Goal: Individualized Educational Video(s) Outcome: Progressing   Problem: Coping: Goal: Ability to adjust to condition or change in health will improve Outcome: Progressing   Problem: Fluid Volume: Goal: Ability to maintain a balanced intake and output will improve Outcome: Progressing   Problem: Health Behavior/Discharge Planning: Goal: Ability to identify and utilize available resources and services will improve Outcome: Progressing Goal: Ability to manage health-related needs will improve Outcome: Progressing   Problem: Metabolic: Goal: Ability to maintain appropriate glucose levels will improve Outcome: Progressing   Problem: Nutritional: Goal: Maintenance of adequate nutrition will improve Outcome: Progressing Goal: Progress toward achieving an optimal weight will improve Outcome: Progressing   Problem: Skin Integrity: Goal: Risk for impaired skin integrity will decrease Outcome: Progressing   Problem: Activity: Goal: Ability to tolerate increased activity will improve Outcome: Progressing

## 2023-03-13 ENCOUNTER — Inpatient Hospital Stay (HOSPITAL_COMMUNITY)
Admit: 2023-03-13 | Discharge: 2023-03-13 | Disposition: A | Discharge status: Home / Self Care | Payer: Medicare PPO | Attending: Physician Assistant | Admitting: Physician Assistant

## 2023-03-13 ENCOUNTER — Other Ambulatory Visit (HOSPITAL_COMMUNITY): Payer: Self-pay

## 2023-03-13 DIAGNOSIS — E871 Hypo-osmolality and hyponatremia: Secondary | ICD-10-CM | POA: Diagnosis not present

## 2023-03-13 DIAGNOSIS — E1165 Type 2 diabetes mellitus with hyperglycemia: Secondary | ICD-10-CM

## 2023-03-13 DIAGNOSIS — N1831 Chronic kidney disease, stage 3a: Secondary | ICD-10-CM | POA: Diagnosis not present

## 2023-03-13 DIAGNOSIS — R112 Nausea with vomiting, unspecified: Secondary | ICD-10-CM

## 2023-03-13 DIAGNOSIS — I471 Supraventricular tachycardia, unspecified: Secondary | ICD-10-CM

## 2023-03-13 DIAGNOSIS — N179 Acute kidney failure, unspecified: Secondary | ICD-10-CM | POA: Diagnosis not present

## 2023-03-13 DIAGNOSIS — I2699 Other pulmonary embolism without acute cor pulmonale: Secondary | ICD-10-CM | POA: Diagnosis not present

## 2023-03-13 DIAGNOSIS — Z794 Long term (current) use of insulin: Secondary | ICD-10-CM

## 2023-03-13 DIAGNOSIS — R7989 Other specified abnormal findings of blood chemistry: Secondary | ICD-10-CM | POA: Diagnosis not present

## 2023-03-13 LAB — COMPREHENSIVE METABOLIC PANEL
ALT: 242 U/L — ABNORMAL HIGH (ref 0–44)
AST: 127 U/L — ABNORMAL HIGH (ref 15–41)
Albumin: 2.5 g/dL — ABNORMAL LOW (ref 3.5–5.0)
Alkaline Phosphatase: 336 U/L — ABNORMAL HIGH (ref 38–126)
Anion gap: 9 (ref 5–15)
BUN: 36 mg/dL — ABNORMAL HIGH (ref 8–23)
CO2: 25 mmol/L (ref 22–32)
Calcium: 8.1 mg/dL — ABNORMAL LOW (ref 8.9–10.3)
Chloride: 101 mmol/L (ref 98–111)
Creatinine, Ser: 1.59 mg/dL — ABNORMAL HIGH (ref 0.61–1.24)
GFR, Estimated: 48 mL/min — ABNORMAL LOW (ref >60)
Glucose, Bld: 187 mg/dL — ABNORMAL HIGH (ref 70–99)
Potassium: 4.2 mmol/L (ref 3.5–5.1)
Sodium: 135 mmol/L (ref 135–145)
Total Bilirubin: 0.7 mg/dL (ref <1.2)
Total Protein: 5.6 g/dL — ABNORMAL LOW (ref 6.5–8.1)

## 2023-03-13 LAB — GLUCOSE, CAPILLARY
Glucose-Capillary: 148 mg/dL — ABNORMAL HIGH (ref 70–99)
Glucose-Capillary: 188 mg/dL — ABNORMAL HIGH (ref 70–99)
Glucose-Capillary: 157 mg/dL — ABNORMAL HIGH (ref 70–99)
Glucose-Capillary: 119 mg/dL — ABNORMAL HIGH (ref 70–99)

## 2023-03-13 LAB — HEPARIN LEVEL (UNFRACTIONATED)
Heparin Unfractionated: 0.86 [IU]/mL — ABNORMAL HIGH (ref 0.30–0.70)
Heparin Unfractionated: 0.41 [IU]/mL (ref 0.30–0.70)

## 2023-03-13 LAB — POTASSIUM: Potassium: 4.2 mmol/L (ref 3.5–5.1)

## 2023-03-13 LAB — CBC
HCT: 33.6 % — ABNORMAL LOW (ref 39.0–52.0)
Hemoglobin: 11.4 g/dL — ABNORMAL LOW (ref 13.0–17.0)
MCH: 32.5 pg (ref 26.0–34.0)
MCHC: 33.9 g/dL (ref 30.0–36.0)
MCV: 95.7 fL (ref 80.0–100.0)
Platelets: 309 10*3/uL (ref 150–400)
RBC: 3.51 MIL/uL — ABNORMAL LOW (ref 4.22–5.81)
RDW: 14.6 % (ref 11.5–15.5)
WBC: 8.4 10*3/uL (ref 4.0–10.5)
nRBC: 0 % (ref 0.0–0.2)

## 2023-03-13 LAB — MAGNESIUM: Magnesium: 1.6 mg/dL — ABNORMAL LOW (ref 1.7–2.4)

## 2023-03-13 MED ORDER — APIXABAN 5 MG PO TABS: 5.0000 mg | ORAL_TABLET | Freq: Two times a day (BID) | ORAL | Status: DC

## 2023-03-13 MED ORDER — MAGNESIUM SULFATE 2 GM/50ML IV SOLN
2.0000 g | Freq: Once | INTRAVENOUS | Status: AC
  Administered 2023-03-13: 2 g via INTRAVENOUS
  Filled 2023-03-13: qty 50

## 2023-03-13 MED ORDER — INSULIN GLARGINE-YFGN 100 UNIT/ML ~~LOC~~ SOLN
5.0000 [IU] | Freq: Every day | SUBCUTANEOUS | Status: DC
  Administered 2023-03-13 – 2023-03-17 (×5): 5 [IU] via SUBCUTANEOUS
  Filled 2023-03-13 (×5): qty 0.05

## 2023-03-13 MED ORDER — METOPROLOL SUCCINATE ER 25 MG PO TB24
25.0000 mg | ORAL_TABLET | Freq: Every day | ORAL | Status: DC
  Administered 2023-03-13 – 2023-03-16 (×4): 25 mg via ORAL
  Filled 2023-03-13 (×4): qty 1

## 2023-03-13 MED ORDER — APIXABAN 5 MG PO TABS
10.0000 mg | ORAL_TABLET | Freq: Two times a day (BID) | ORAL | Status: DC
  Administered 2023-03-13 – 2023-03-17 (×9): 10 mg via ORAL
  Filled 2023-03-13 (×9): qty 2

## 2023-03-13 NOTE — Progress Notes (Signed)
Zio monitor placed on patient.

## 2023-03-13 NOTE — Plan of Care (Signed)
  Problem: Fluid Volume: Goal: Ability to maintain a balanced intake and output will improve Outcome: Progressing   Problem: Activity: Goal: Ability to tolerate increased activity will improve Outcome: Progressing   Problem: Activity: Goal: Risk for activity intolerance will decrease Outcome: Progressing   Problem: Coping: Goal: Ability to adjust to condition or change in health will improve Outcome: Not Progressing   Problem: Health Behavior/Discharge Planning: Goal: Ability to manage health-related needs will improve Outcome: Not Progressing

## 2023-03-13 NOTE — Progress Notes (Signed)
PHARMACY - ANTICOAGULATION CONSULT NOTE  Pharmacy Consult for heparin>>Apixaban Indication: pulmonary embolus  Allergies  Allergen Reactions   Contrast Media [Iodinated Contrast Media] Anaphylaxis   Iodine Anaphylaxis   Sulfamethoxazole-Trimethoprim Nausea And Vomiting and Swelling    Other Reaction(s): Renal failure syndrome   Lisinopril Other (See Comments)    hyperkalemia  Other Reaction(s): Hyperkalemia   Varenicline     Other Reaction(s): Altered mental status   Ciprofloxacin Other (See Comments)    unknown   Metformin Other (See Comments)    Increased lactic acid  Other Reaction(s): Increased lactic acid level    Patient Measurements: Height: 5\' 9"  (175.3 cm) Weight: 61.3 kg (135 lb 2.3 oz) IBW/kg (Calculated) : 70.7 Heparin Dosing Weight: TBW  Vital Signs: Temp: 98 F (36.7 C) (12/26 0850) Temp Source: Oral (12/26 0850) BP: 124/61 (12/26 0850) Pulse Rate: 87 (12/26 0850)  Labs: Recent Labs    03/10/23 1611 03/10/23 2015 03/11/23 0240 03/11/23 1017 03/11/23 2032 03/12/23 0230 03/12/23 1340 03/12/23 2027 03/13/23 0233 03/13/23 0234  HGB  --    < > 12.9*  --   --  11.5*  --   --  11.4*  --   HCT  --   --  36.8*  --   --  33.4*  --   --  33.6*  --   PLT  --   --  320  --   --  288  --   --  309  --   APTT  --   --   --   --  45*  --   --   --   --   --   LABPROT 13.5  --   --   --   --   --   --   --   --   --   INR 1.0  --   --   --   --   --   --   --   --   --   HEPARINUNFRC  --    < >  --    < > 0.20* 0.27* 0.48 0.83* 0.86*  --   CREATININE  --   --  2.07*  --   --   --  1.68*  --   --  1.59*   < > = values in this interval not displayed.    Estimated Creatinine Clearance: 39.6 mL/min (A) (by C-G formula based on SCr of 1.59 mg/dL (H)).   Medical History: Past Medical History:  Diagnosis Date   Cancer (HCC)    lung cancer   COPD (chronic obstructive pulmonary disease) (HCC)    Diabetes mellitus without complication (HCC)    Hyperlipemia     Hypertension     Assessment: Martin Thomas is a 67 y.o. year old male admitted on 03/09/2023 with concern for CP found to have PE. VQ scan with mismatch consistent with PE. No anticoagulation prior to admission. Prior to start of heparin drip, pt received enoxaparin 30mg  SQ 12/23 0925. Patient has been on heparin. Current HL resulted as 0.41. Cardiololgy now wishes to transition to PO Apixaban    Goal of Therapy:  Monitor platelets by anticoagulation protocol: Yes   Plan:  Stop heparin drip and start Apixaban 10mg  PO BID x 7 days, then 5mg  PO BID Monitor signs/symptoms of bleeding   Ketan Renz A. Jeanella Craze, PharmD, BCPS, FNKF Clinical Pharmacist [place] Please utilize Amion for appropriate phone number to reach the unit pharmacist [facility]  Pharmacy)  03/13/2023,10:53 AM

## 2023-03-13 NOTE — Progress Notes (Signed)
PHARMACY - ANTICOAGULATION CONSULT NOTE  Pharmacy Consult for heparin Indication: pulmonary embolus  Allergies  Allergen Reactions   Contrast Media [Iodinated Contrast Media] Anaphylaxis   Iodine Anaphylaxis   Sulfamethoxazole-Trimethoprim Nausea And Vomiting and Swelling    Other Reaction(s): Renal failure syndrome   Lisinopril Other (See Comments)    hyperkalemia  Other Reaction(s): Hyperkalemia   Varenicline     Other Reaction(s): Altered mental status   Ciprofloxacin Other (See Comments)    unknown   Metformin Other (See Comments)    Increased lactic acid  Other Reaction(s): Increased lactic acid level    Patient Measurements: Height: 5\' 9"  (175.3 cm) Weight: 62.7 kg (138 lb 3.7 oz) IBW/kg (Calculated) : 70.7 Heparin Dosing Weight: TBW  Vital Signs: Temp: 97.7 F (36.5 C) (12/26 0059) Temp Source: Oral (12/26 0059) BP: 113/77 (12/26 0059) Pulse Rate: 121 (12/26 0059)  Labs: Recent Labs    03/10/23 1611 03/10/23 2015 03/11/23 0240 03/11/23 1017 03/11/23 2032 03/12/23 0230 03/12/23 1340 03/12/23 2027 03/13/23 0233 03/13/23 0234  HGB  --    < > 12.9*  --   --  11.5*  --   --  11.4*  --   HCT  --   --  36.8*  --   --  33.4*  --   --  33.6*  --   PLT  --   --  320  --   --  288  --   --  309  --   APTT  --   --   --   --  45*  --   --   --   --   --   LABPROT 13.5  --   --   --   --   --   --   --   --   --   INR 1.0  --   --   --   --   --   --   --   --   --   HEPARINUNFRC  --    < >  --    < > 0.20* 0.27* 0.48 0.83* 0.86*  --   CREATININE  --   --  2.07*  --   --   --  1.68*  --   --  1.59*   < > = values in this interval not displayed.    Estimated Creatinine Clearance: 40.5 mL/min (A) (by C-G formula based on SCr of 1.59 mg/dL (H)).   Medical History: Past Medical History:  Diagnosis Date   Cancer (HCC)    lung cancer   COPD (chronic obstructive pulmonary disease) (HCC)    Diabetes mellitus without complication (HCC)    Hyperlipemia     Hypertension     Assessment: Martin Thomas is a 67 y.o. year old male admitted on 03/09/2023 with concern for CP found to have PE. VQ scan with mismatch consistent with PE. No anticoagulation prior to admission. Prior to start of heparin drip, pt received enoxaparin 30mg  SQ 12/23 0925. Pharmacy consulted to dose heparin. Per MD, plan to hold transition to oral anticoagulation until all procedures are complete.  Heparin level 0.86, supra-therapeutic  Heparin drip running appropriately at 1700 units/hr. Lab drawn appropriately. Nurse noted some increased bleeding where patient had skin tear, but bleeding was able to be easily controlled with bandage. Will continue to monitor and alert if bleeding increases or requiring frequent bandage changes.   Goal of Therapy:  Heparin level 0.3-0.7 units/ml Monitor  platelets by anticoagulation protocol: Yes   Plan:  Decrease heparin 1600 units/hr 6h heparin level  Monitor daily heparin level, CBC, and signs/symptoms of bleeding F/u long term AC plan  Thank you for allowing pharmacy to participate in this patient's care.  Marja Kays, PharmD Clinical Pharmacist 03/13/2023,3:44 AM

## 2023-03-13 NOTE — Progress Notes (Signed)
Patient ID: Martin Thomas, male   DOB: 08-11-1955, 67 y.o.   MRN: [id number]    Progress Note   Subjective   Day # 4 CC;chest pain, elevated LFT's  IV heparin  Nuc med perfusion scan - RUL PE Echo-EF 35 to 40%, no mitral or aortic stenosis  Labs-  Hgb 11.4/ plts 309 K+ 4.2 BUN 36/creat 1.59- improved  Tbili 0.7/alk phos 336/AST 127/ALT 242  ANA negative/IgG 807 Antimitochondrial antibody negative/anti-smooth muscle antibody negative Hepatitis serologies negative(hepatitis A total Ab +)  Patient is sitting up eating lunch, no complaints, no abdominal discomfort or nausea, no chest pain or shortness of breath   Objective   Vital signs in last 24 hours: Temp:  [97.7 F (36.5 C)-98.5 F (36.9 C)] 97.9 F (36.6 C) (12/26 1120) Pulse Rate:  [77-132] 77 (12/26 1120) Resp:  [14-20] 17 (12/26 1120) BP: (105-126)/(43-77) 105/44 (12/26 1120) SpO2:  [96 %-100 %] 99 % (12/26 1120) Weight:  [61.3 kg] 61.3 kg (12/26 4098) Last BM Date : 03/09/23 (per patient) General:    older WM in NAD Heart:  Regular rate and rhythm; no murmurs-pulse in the 70s Lungs: Respirations even and unlabored, lungs CTA bilaterally Abdomen:  Soft, nontender and nondistended. Normal bowel sounds. Extremities:  Without edema. Neurologic:  Alert and oriented,  grossly normal neurologically. Psych:  Cooperative. Normal mood and affect.  Intake/Output from previous day: 12/25 0701 - 12/26 0700 In: 1513.5 [P.O.:720; I.V.:793.5] Out: 1751 [Urine:1750; Stool:1] Intake/Output this shift: Total I/O In: 60 [P.O.:60] Out: 300 [Urine:300]  Lab Results: Recent Labs    03/11/23 0240 03/12/23 0230 03/13/23 0233  WBC 9.6 9.6 8.4  HGB 12.9* 11.5* 11.4*  HCT 36.8* 33.4* 33.6*  PLT 320 288 309   BMET Recent Labs    03/11/23 0240 03/11/23 1522 03/12/23 1340 03/13/23 0233 03/13/23 0234  NA 127* 132* 136  --  135  K 3.5  --  4.2 4.2 4.2  CL 93*  --  97*  --  101  CO2 25  --  25  --  25  GLUCOSE 104*   --  195*  --  187*  BUN 59*  --  42*  --  36*  CREATININE 2.07*  --  1.68*  --  1.59*  CALCIUM 7.9*  --  8.2*  --  8.1*   LFT Recent Labs    03/13/23 0234  PROT 5.6*  ALBUMIN 2.5*  AST 127*  ALT 242*  ALKPHOS 336*  BILITOT 0.7   PT/INR Recent Labs    03/10/23 1611  LABPROT 13.5  INR 1.0        Assessment / Plan:     #52 67 year old white male with chest pain, shortness of breath-found to have a right upper lobe PE On IV heparin  #2 COPD Hypermetabolic pulmonary nodules  #3 coronary artery disease status post MI May 2024 status post DES #4 congestive heart failure-current EF 35 to 40% #5 acute kidney injury improving #6 elevated LFTs Has had a chronically elevated alkaline phosphatase at least back to 2014, and intermittent AST/ALT elevation Etiology for LFTs currently not clear, low suspicion for choledocholithiasis, 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 ( drug-induced liver injury), with chronically elevated alkaline phosphatase needs further workup  #7 history of achalasia status post POEM 2019 #8 diabetes mellitus  Plan; continue to trend LFTs MRI/MRCP prior to discharge, had been holding until creatinine trended down  Principal Problem:   Chest pain Active Problems:   DM2 (diabetes mellitus, type 2) (HCC)  HTN (hypertension)   High cholesterol   Heart failure with mildly reduced ejection fraction (HFmrEF) (HCC)   CKD stage 3a, GFR 45-59 ml/min (HCC)   Transaminitis   AKI (acute kidney injury) (HCC)   Pulmonary nodule 1 cm or greater in diameter   Elevated liver function tests   Pulmonary embolus (HCC)   Precordial chest pain   Elevated troponin   HFrEF (heart failure with reduced ejection fraction) (HCC)   Hyponatremia     LOS: 3 days   Isaid Salvia PA-C 03/13/2023, 12:19 PM

## 2023-03-13 NOTE — Progress Notes (Signed)
PROGRESS NOTE    Martin Thomas  ZOX:[id number] DOB: 06/08/1955 DOA: 03/09/2023 PCP: Default, Provider, MD   Brief Narrative: Martin Thomas is a 67 y.o. 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  Patient presented secondary to chest pain concerning for possible cardiac etiology.  D-dimer elevated and VQ scan obtained which was significant for a right upper lobe pulmonary embolism.  Hospitalization also complicated by elevated AST/ALT/bilirubin with negative imaging.  Cardiology and gastroenterology consulted..   Assessment/Plan:  Chest pain Patient is high risk for cardiac chest pain with history of CAD/NSTEMI in May status post PCI to RCA with DES.  Patient with persistent tachycardia on admission and troponin elevation to 24.  D-dimer ordered and is elevated at 0.84.  Cardiology was consulted for evaluation as well.  Perfusion scan ordered and is significant for right upper lobe pulmonary embolism.  Chest pain is currently resolved. Transthoracic Echocardiogram significant for reduced LVEF of 35-40% with regional wall motion abnormality. -Treat pulmonary embolism  CAD Recent PCI with stent placement. Patient is on aspirin and Brilinta as an outpatient. -Discontinue aspirin and continue Brilinta while on anticoagulation for PE per cardiology recommendations  AKI Baseline creatinine of about 1.3-1.6. Creatinine of 2.65 on admission. Presumed secondary to prerenal causes from hypovolemia secondary to nausea/vomiting. CT imaging without evidence of hydronephrosis or ureteral obstruction.  Patient started on IV fluids with some initial improvement of creatinine Continued improvement of creatinine, now down to 1.59 today and at baseline.  Elevated AST/ALT Elevated ALP hyperbilirubinemia No associated abdominal pain. Imaging significant for evidence of cholelithiasis but without evidence of  cholecystitis or biliary dilation. GI consulted and recommend holding MRCP in setting of AKI. Autoimmune workup initiated. Hepatitis A total antibody reactive with non-reactive IgM indicating likely immunity. GGT elevated. Autoimmune workup negative. AST/ALT trending down with persistently elevated alkaline phosphatase; still no abdominal pain. -GI recommendations: considering MRCP pending improvement of renal function. No recommendations from 12/25; will follow-up today  Hyponatremia Patient is euvolemic on exam. Concern for hypovolemic cause of hyponatremia no admission. Serum osmolality is slightly elevated at 300, however. Urine osmolality/sodium of 452/19 respectively. Hyponatremia resolved.  Acute right upper lobe pulmonary embolism Patient with chest pain and d-dimer of 0.84. Pulmonary perfusion scan performed on 12/23 identifying evidence of pulmonary embolism.  Echocardiogram this admission significant for mildly reduced right ventricular systolic function with normal right ventricular size.  Patient started on heparin IV and transitioned to Eliquis per cardiology. -Continue Eliquis  HFrEF Previous LVEF of 45 to 50% in May 2024 LVEF worsened this admission to 35 to 40% with regional wall motion abnormalities.  SVT Noted on telemetry per cardiology. Cardiology plan to possible increase metoprolol dosing -Follow-up cardiology recommendations  Right lower lobe pulmonary nodule Noted on CT abdomen imaging. Nodule measuring 14 mm with slightly decreased size compared to prior.  Hypercholesterolemia Patient is on Lipitor and Repatha as an outpatient. -Hold Lipitor secondary to elevated AST/ALT  Primary hypertension Patient is on losartan and metoprolol as an outpatient. Losartan held secondary to AKI. -Continue metoprolol  Diabetes mellitus type 2 Patient is on Jardiance and insuline glargine as an outpatient. Diabetes is poorly controlled with a hemoglobin A1C of 8.2%. -Continue  SSI -Start Semglee 5 units daily   DVT prophylaxis: Heparin IV Code Status:   Code Status: Full Code Family Communication: None at bedside Disposition Plan: Discharge home pending continued specialist recommendations   Consultants:  Cardiology  [facility]  Procedures:  Transthoracic Echocardiogram  Antimicrobials: None    Subjective: No concerns today.  Objective: BP 124/61 (BP Location: Right Arm)   Pulse 87   Temp 98 F (36.7 C) (Oral)   Resp 18   Ht 5\' 9"  (1.753 m)   Wt 61.3 kg   SpO2 99%   BMI 19.96 kg/m   Examination:  General exam: Appears calm and comfortable Respiratory system: Clear to auscultation. Respiratory effort normal. Cardiovascular system: S1 & S2 heard, RRR. Gastrointestinal system: Abdomen is nondistended, soft and nontender. Normal bowel sounds heard. Central nervous system: Alert and oriented. No focal neurological deficits. Psychiatry: Judgement and insight appear normal. Mood & affect appropriate.    Data Reviewed: I have personally reviewed following labs and imaging studies   Last CBC Lab Results  Component Value Date   WBC 8.4 03/13/2023   HGB 11.4 (L) 03/13/2023   HCT 33.6 (L) 03/13/2023   MCV 95.7 03/13/2023   MCH 32.5 03/13/2023   RDW 14.6 03/13/2023   PLT 309 03/13/2023     Last metabolic panel Lab Results  Component Value Date   GLUCOSE 187 (H) 03/13/2023   NA 135 03/13/2023   K 4.2 03/13/2023   CL 101 03/13/2023   CO2 25 03/13/2023   BUN 36 (H) 03/13/2023   CREATININE 1.59 (H) 03/13/2023   GFRNONAA 48 (L) 03/13/2023   CALCIUM 8.1 (L) 03/13/2023   PHOS 3.3 07/23/2022   PROT 5.6 (L) 03/13/2023   ALBUMIN 2.5 (L) 03/13/2023   BILITOT 0.7 03/13/2023   ALKPHOS 336 (H) 03/13/2023   AST 127 (H) 03/13/2023   ALT 242 (H) 03/13/2023   ANIONGAP 9 03/13/2023     Creatinine Clearance: Estimated Creatinine Clearance: 39.6 mL/min (A) (by C-G formula based on SCr of 1.59 mg/dL (H)).  Recent Results  (from the past 240 hours)  Resp panel by RT-PCR (RSV, Flu A&B, Covid) Anterior Nasal Swab     Status: None   Collection Time: 03/09/23  5:21 PM   Specimen: Anterior Nasal Swab  Result Value Ref Range Status   SARS Coronavirus 2 by RT PCR NEGATIVE NEGATIVE Final   Influenza A by PCR NEGATIVE NEGATIVE Final   Influenza B by PCR NEGATIVE NEGATIVE Final    Comment: (NOTE) The Xpert Xpress SARS-CoV-2/FLU/RSV plus assay is intended as an aid in the diagnosis of influenza from Nasopharyngeal swab specimens and should not be used as a sole basis for treatment. Nasal washings and aspirates are unacceptable for Xpert Xpress SARS-CoV-2/FLU/RSV testing.  Fact Sheet for Patients: BloggerCourse.com  Fact Sheet for Healthcare Providers: SeriousBroker.it  This test is not yet approved or cleared by the [place] FDA and has been authorized for detection and/or diagnosis of SARS-CoV-2 by FDA under an Emergency Use Authorization (EUA). This EUA will remain in effect (meaning this test can be used) for the duration of the COVID-19 declaration under Section 564(b)(1) of the Act, 21 U.S.C. section 360bbb-3(b)(1), unless the authorization is terminated or revoked.     Resp Syncytial Virus by PCR NEGATIVE NEGATIVE Final    Comment: (NOTE) Fact Sheet for Patients: BloggerCourse.com  Fact Sheet for Healthcare Providers: SeriousBroker.it  This test is not yet approved or cleared by the [place] FDA and has been authorized for detection and/or diagnosis of SARS-CoV-2 by FDA under an Emergency Use Authorization (EUA). This EUA will remain in effect (meaning this test can be used) for the duration of the COVID-19 declaration under Section 564(b)(1) of the Act,  21 U.S.C. section 360bbb-3(b)(1), unless the authorization is terminated or revoked.  Performed at [facility], [address], [place], [place] [address]   Blood culture (routine x 2)     Status: None (Preliminary result)   Collection Time: 03/09/23  8:12 PM   Specimen: BLOOD RIGHT ARM  Result Value Ref Range Status   Specimen Description BLOOD RIGHT ARM  Final   Special Requests   Final    BOTTLES DRAWN AEROBIC AND ANAEROBIC Blood Culture adequate volume   Culture   Final    NO GROWTH 4 DAYS Performed at [facility], [address], [place], [place] [address]    Report Status PENDING  Incomplete  Blood culture (routine x 2)     Status: None (Preliminary result)   Collection Time: 03/09/23  8:12 PM   Specimen: BLOOD LEFT ARM  Result Value Ref Range Status   Specimen Description BLOOD LEFT ARM  Final   Special Requests   Final    BOTTLES DRAWN AEROBIC AND ANAEROBIC Blood Culture adequate volume   Culture   Final    NO GROWTH 4 DAYS Performed at [facility], [address], [place], [place] [address]    Report Status PENDING  Incomplete      Radiology Studies: No results found.     LOS: 3 days    Jacquelin Hawking, MD [facility] 03/13/2023, 11:27 AM   If 7PM-7AM, please contact night-coverage [email or web address]

## 2023-03-13 NOTE — Progress Notes (Signed)
Patient had runs of SVT on cardiac monitor, assesed patient, alert and oriented,  Denies chest pain, palpitations and shortness of breath. EKG done. Informed Dr. Phillips Odor about the events. With new orders made and carried out. Continue to monitor patient vital signs and cardiac rhythm for any untoward signs and symptoms.
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
## 2023-03-13 NOTE — Plan of Care (Signed)
  Problem: Education: Goal: Ability to describe self-care measures that may prevent or decrease complications (Diabetes Survival Skills Education) will improve Outcome: Progressing Goal: Individualized Educational Video(s) Outcome: Progressing   Problem: Coping: Goal: Ability to adjust to condition or change in health will improve Outcome: Progressing   Problem: Fluid Volume: Goal: Ability to maintain a balanced intake and output will improve Outcome: Progressing   Problem: Health Behavior/Discharge Planning: Goal: Ability to identify and utilize available resources and services will improve Outcome: Progressing Goal: Ability to manage health-related needs will improve Outcome: Progressing   Problem: Metabolic: Goal: Ability to maintain appropriate glucose levels will improve Outcome: Progressing   Problem: Nutritional: Goal: Maintenance of adequate nutrition will improve Outcome: Progressing Goal: Progress toward achieving an optimal weight will improve Outcome: Progressing   Problem: Skin Integrity: Goal: Risk for impaired skin integrity will decrease Outcome: Progressing   Problem: Tissue Perfusion: Goal: Adequacy of tissue perfusion will improve Outcome: Progressing   Problem: Education: Goal: Understanding of cardiac disease, CV risk reduction, and recovery process will improve Outcome: Progressing Goal: Individualized Educational Video(s) Outcome: Progressing   Problem: Activity: Goal: Ability to tolerate increased activity will improve Outcome: Progressing   Problem: Cardiac: Goal: Ability to achieve and maintain adequate cardiovascular perfusion will improve Outcome: Progressing   Problem: Health Behavior/Discharge Planning: Goal: Ability to safely manage health-related needs after discharge will improve Outcome: Progressing   Problem: Education: Goal: Knowledge of General Education information will improve Description: Including pain rating scale,  medication(s)/side effects and non-pharmacologic comfort measures Outcome: Progressing   Problem: Health Behavior/Discharge Planning: Goal: Ability to manage health-related needs will improve Outcome: Progressing   Problem: Clinical Measurements: Goal: Ability to maintain clinical measurements within normal limits will improve Outcome: Progressing Goal: Will remain free from infection Outcome: Progressing Goal: Diagnostic test results will improve Outcome: Progressing Goal: Respiratory complications will improve Outcome: Progressing Goal: Cardiovascular complication will be avoided Outcome: Progressing   Problem: Activity: Goal: Risk for activity intolerance will decrease Outcome: Progressing   Problem: Nutrition: Goal: Adequate nutrition will be maintained Outcome: Progressing   Problem: Coping: Goal: Level of anxiety will decrease Outcome: Progressing   Problem: Elimination: Goal: Will not experience complications related to bowel motility Outcome: Progressing Goal: Will not experience complications related to urinary retention Outcome: Progressing   Problem: Pain Management: Goal: General experience of comfort will improve Outcome: Progressing   Problem: Safety: Goal: Ability to remain free from injury will improve Outcome: Progressing   Problem: Skin Integrity: Goal: Risk for impaired skin integrity will decrease Outcome: Progressing

## 2023-03-14 DIAGNOSIS — R079 Chest pain, unspecified: Secondary | ICD-10-CM | POA: Diagnosis not present

## 2023-03-14 LAB — CULTURE, BLOOD (ROUTINE X 2)
Culture: NO GROWTH
Culture: NO GROWTH
Special Requests: ADEQUATE
Special Requests: ADEQUATE

## 2023-03-14 LAB — COMPREHENSIVE METABOLIC PANEL
ALT: 173 U/L — ABNORMAL HIGH (ref 0–44)
AST: 61 U/L — ABNORMAL HIGH (ref 15–41)
Albumin: 2.6 g/dL — ABNORMAL LOW (ref 3.5–5.0)
Alkaline Phosphatase: 287 U/L — ABNORMAL HIGH (ref 38–126)
Anion gap: 8 (ref 5–15)
BUN: 30 mg/dL — ABNORMAL HIGH (ref 8–23)
CO2: 22 mmol/L (ref 22–32)
Calcium: 8.4 mg/dL — ABNORMAL LOW (ref 8.9–10.3)
Chloride: 104 mmol/L (ref 98–111)
Creatinine, Ser: 1.77 mg/dL — ABNORMAL HIGH (ref 0.61–1.24)
GFR, Estimated: 42 mL/min — ABNORMAL LOW (ref >60)
Glucose, Bld: 122 mg/dL — ABNORMAL HIGH (ref 70–99)
Potassium: 4.7 mmol/L (ref 3.5–5.1)
Sodium: 134 mmol/L — ABNORMAL LOW (ref 135–145)
Total Bilirubin: 0.8 mg/dL (ref <1.2)
Total Protein: 5.8 g/dL — ABNORMAL LOW (ref 6.5–8.1)

## 2023-03-14 LAB — CBC
HCT: 34.5 % — ABNORMAL LOW (ref 39.0–52.0)
Hemoglobin: 11.6 g/dL — ABNORMAL LOW (ref 13.0–17.0)
MCH: 32.3 pg (ref 26.0–34.0)
MCHC: 33.6 g/dL (ref 30.0–36.0)
MCV: 96.1 fL (ref 80.0–100.0)
Platelets: 347 10*3/uL (ref 150–400)
RBC: 3.59 MIL/uL — ABNORMAL LOW (ref 4.22–5.81)
RDW: 14.7 % (ref 11.5–15.5)
WBC: 8.8 10*3/uL (ref 4.0–10.5)
nRBC: 0 % (ref 0.0–0.2)

## 2023-03-14 LAB — GLUCOSE, CAPILLARY
Glucose-Capillary: 118 mg/dL — ABNORMAL HIGH (ref 70–99)
Glucose-Capillary: 251 mg/dL — ABNORMAL HIGH (ref 70–99)
Glucose-Capillary: 173 mg/dL — ABNORMAL HIGH (ref 70–99)
Glucose-Capillary: 148 mg/dL — ABNORMAL HIGH (ref 70–99)

## 2023-03-14 NOTE — Progress Notes (Signed)
PROGRESS NOTE Martin Thomas  ZOX:[id number] DOB: Oct 17, 1955 DOA: 03/09/2023 PCP: Default, Provider, MD  Brief Narrative/Hospital Course: 67 y.o. 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  Patient presented secondary to chest pain concerning for possible cardiac etiology.  D-dimer elevated and VQ scan obtained which was significant for a right upper lobe pulmonary embolism.  Hospitalization also complicated by elevated AST/ALT/bilirubin with negative imaging.  Cardiology and gastroenterology consulted. He chest pain was found to be due to Acute PE, initially on heparin transitioned to Eliquis.  GI planning for MRCP for elevated LFTs.    Subjective: Seen and examined this morning Overnight afebrile BP stable in 90s to 130s Labs shows creatinine slightly up 1.7 from 1.5 stable electrolytes AST ALT down to 61/173 T. bili normal CBC stable. He feels fine on RA   Assessment and Plan: Principal Problem:   Chest pain Active Problems:   Transaminitis   AKI (acute kidney injury) (HCC)   Heart failure with mildly reduced ejection fraction (HFmrEF) (HCC)   DM2 (diabetes mellitus, type 2) (HCC)   HTN (hypertension)   High cholesterol   CKD stage 3a, GFR 45-59 ml/min (HCC)   Pulmonary nodule 1 cm or greater in diameter   Elevated liver function tests   Pulmonary embolus (HCC)   Precordial chest pain   Elevated troponin   HFrEF (heart failure with reduced ejection fraction) (HCC)   Hyponatremia   Nausea and vomiting in adult   Acute RUL PE Elevated troponin due to demand ischemia: CAD/NSTEMI in may  status post PCI to RCA with DES: Presents with chest pain tachycardia elevated troponin and found to have acute PE-seen by cardiology, CKD precludes cardiac cath mild decline in EF at 35-40% from previous 45-50% noted.  Chest pain resolved, continue with Brilinta, aspirin stopped as patient on DOAC. On  completion of PE treatment start back on aspirin 81 as outpatient. Statin on hold due to transaminitis, if further chest pain consider PET stress CT as outpatient.  Cardiology signed off advised Zio patch as outpatient and follow-up  Acute renal failure: B/l creat ~ 1.3-1.6. was 2.65 on admission likely secondary to prerenal congestion and hypovolemia, nausea vomiting.  CT imaging without evidence of hydronephrosis or ureteral obstruction.Patient started on IV fluids with some initial improvement of creatinine -slightly elevated but monitor  Recent Labs    07/23/22 0040 08/04/22 1750 08/04/22 1800 08/05/22 0700 03/09/23 1422 03/09/23 2311 03/10/23 0729 03/11/23 0240 03/12/23 1340 03/13/23 0233 03/13/23 0234 03/14/23 0240  BUN 30* 18 25* 18 73* 67* 60* 59* 42*  --  36* 30*  CREATININE 1.48* 1.33* 1.30* 1.42* 2.65* 2.42* 2.25* 2.07* 1.68*  --  1.59* 1.77*  CO2 25 24  --  27 20* 20* 21* 25 25  --  25 22  K 4.7 5.5* 5.7* 4.2 3.9 3.6 3.7 3.5 4.2 4.2 4.2 4.7    Transaminitis: Patient asymptomatic without abdominal pain.  S/P cholelithiasis GI following plan for MRCP-but holding due to AKI-recommending MRCP prior to discharge- follow-up autoimmune workup viral serologies, fortunately LFTs improving.  HFrEF: Previous EF 45-50% Mildly reduced to 35-40%, seen by cardiology GDMT-continue due to patient's renal dysfunction.Cont on Toprol  SVT: Continue Toprol  RLL pulm nodule: Incidentally noted on CT 14 mm slightly decreased compared to prior, follow-up outpatient  HLD: Statins on hold  HTN: Continue metoprolol losartan on hold due to AKI  Diabetes mellitus T2: Patient is on  Jardiance and insuline glargine as an outpatient. Diabetes is poorly controlled with a hemoglobin A1C of 8.2%.Continue SSI and Semglee 5 units daily. Recent Labs  Lab 03/10/23 0038 03/10/23 0808 03/13/23 1119 03/13/23 1503 03/13/23 2108 03/14/23 0726 03/14/23 1119  GLUCAP  --    < > 188* 157* 119* 118*  251*  HGBA1C 8.2*  --   --   --   --   --   --    < > = values in this interval not displayed.    Ambulatory dysfunction LT AKA: WC bound since 2019,  DVT prophylaxis: eliquis Code Status:   Code Status: Full Code Family Communication: plan of care discussed with patient at bedside. Patient status is: Remains hospitalized because of severity of illness Level of care: Telemetry Cardiac   Dispo: The patient is from: home with brother            Anticipated disposition: TBD Objective: Vitals last 24 hrs: Vitals:   03/14/23 0400 03/14/23 0726 03/14/23 1000 03/14/23 1120  BP:  (!) 115/44  (!) 129/58  Pulse:  79  99  Resp: 14 16 18 18   Temp:  98.6 F (37 C)  97.7 F (36.5 C)  TempSrc:  Oral  Oral  SpO2:  96%  97%  Weight:      Height:       Weight change:   Physical Examination: General exam: alert awake,at baseline, older than stated age HEENT:Oral mucosa moist, Ear/Nose WNL grossly Respiratory system: Bilaterally clear BS,no use of accessory muscle Cardiovascular system: S1 & S2 +, No JVD. Gastrointestinal system: Abdomen soft,NT,ND, BS+ Nervous System: Alert, awake, moving all extremities,and following commands. Extremities: LE edema neg,distal peripheral pulses palpable and warm.  Skin: No rashes,no icterus. MSK: Normal muscle bulk,tone, power   Medications reviewed:  Scheduled Meds:  apixaban  10 mg Oral BID   Followed by   Melene Muller ON 03/20/2023] apixaban  5 mg Oral BID   insulin aspart  0-9 Units Subcutaneous TID WC   insulin glargine-yfgn  5 Units Subcutaneous Daily   metoprolol succinate  25 mg Oral QHS   metoprolol succinate  50 mg Oral Daily   mirtazapine  30 mg Oral QHS   ticagrelor  90 mg Oral BID   Continuous Infusions:    Diet Order             Diet Carb Modified Fluid consistency: Thin; Room service appropriate? Yes  Diet effective now                  Intake/Output Summary (Last 24 hours) at 03/14/2023 1240 Last data filed at 03/14/2023  1048 Gross per 24 hour  Intake 300 ml  Output 1280 ml  Net -980 ml   Net IO Since Admission: -2,512.46 mL [03/14/23 1240]  Wt Readings from Last 3 Encounters:  03/13/23 61.3 kg  08/04/22 83.5 kg  07/23/22 81.4 kg     Unresulted Labs (From admission, onward)     Start     Ordered   03/13/23 0500  CBC  Daily,   R     Question:  Specimen collection method  Answer:  Lab=Lab collect  Placed in "And" Linked Group   03/12/23 2103   03/12/23 0736  Comprehensive metabolic panel  Daily,   R      03/12/23 0735          Data Reviewed: I have personally reviewed following labs and imaging studies CBC: Recent Labs  Lab 03/09/23 1422  03/11/23 0240 03/12/23 0230 03/13/23 0233 03/14/23 0240  WBC 13.4* 9.6 9.6 8.4 8.8  HGB 16.4 12.9* 11.5* 11.4* 11.6*  HCT 46.8 36.8* 33.4* 33.6* 34.5*  MCV 92.3 92.9 93.8 95.7 96.1  PLT 396 320 288 309 347   Basic Metabolic Panel:  Recent Labs  Lab 03/09/23 2311 03/10/23 0729 03/11/23 0240 03/11/23 1522 03/12/23 1340 03/13/23 0233 03/13/23 0234 03/14/23 0240  NA 127* 129* 127* 132* 136  --  135 134*  K 3.6 3.7 3.5  --  4.2 4.2 4.2 4.7  CL 95* 97* 93*  --  97*  --  101 104  CO2 20* 21* 25  --  25  --  25 22  GLUCOSE 116* 93 104*  --  195*  --  187* 122*  BUN 67* 60* 59*  --  42*  --  36* 30*  CREATININE 2.42* 2.25* 2.07*  --  1.68*  --  1.59* 1.77*  CALCIUM 7.6* 7.9* 7.9*  --  8.2*  --  8.1* 8.4*  MG 1.6*  --   --   --   --  1.6*  --   --    GFR: Estimated Creatinine Clearance: 35.6 mL/min (A) (by C-G formula based on SCr of 1.77 mg/dL (H)). Liver Function Tests:  Recent Labs  Lab 03/10/23 0729 03/11/23 0240 03/12/23 1340 03/13/23 0234 03/14/23 0240  AST 377* 480* 134* 127* 61*  ALT 342* 530* 273* 242* 173*  ALKPHOS 154* 230* 219* 336* 287*  BILITOT 1.9* 0.9 0.7 0.7 0.8  PROT 5.3* 5.9* 5.2* 5.6* 5.8*  ALBUMIN 2.5* 2.9* 2.4* 2.5* 2.6*   No results for input(s): "LIPASE", "AMYLASE" in the last 168 hours. No results for  input(s): "AMMONIA" in the last 168 hours. Coagulation Profile:  Recent Labs  Lab 03/10/23 1611  INR 1.0   Recent Results (from the past 240 hours)  Resp panel by RT-PCR (RSV, Flu A&B, Covid) Anterior Nasal Swab     Status: None   Collection Time: 03/09/23  5:21 PM   Specimen: Anterior Nasal Swab  Result Value Ref Range Status   SARS Coronavirus 2 by RT PCR NEGATIVE NEGATIVE Final   Influenza A by PCR NEGATIVE NEGATIVE Final   Influenza B by PCR NEGATIVE NEGATIVE Final    Comment: (NOTE) The Xpert Xpress SARS-CoV-2/FLU/RSV plus assay is intended as an aid in the diagnosis of influenza from Nasopharyngeal swab specimens and should not be used as a sole basis for treatment. Nasal washings and aspirates are unacceptable for Xpert Xpress SARS-CoV-2/FLU/RSV testing.  Fact Sheet for Patients: BloggerCourse.com  Fact Sheet for Healthcare Providers: SeriousBroker.it  This test is not yet approved or cleared by the [place] FDA and has been authorized for detection and/or diagnosis of SARS-CoV-2 by FDA under an Emergency Use Authorization (EUA). This EUA will remain in effect (meaning this test can be used) for the duration of the COVID-19 declaration under Section 564(b)(1) of the Act, 21 U.S.C. section 360bbb-3(b)(1), unless the authorization is terminated or revoked.     Resp Syncytial Virus by PCR NEGATIVE NEGATIVE Final    Comment: (NOTE) Fact Sheet for Patients: BloggerCourse.com  Fact Sheet for Healthcare Providers: SeriousBroker.it  This test is not yet approved or cleared by the [place] FDA and has been authorized for detection and/or diagnosis of SARS-CoV-2 by FDA under an Emergency Use Authorization (EUA). This EUA will remain in effect (meaning this test can be used) for the duration of the COVID-19 declaration under Section  564(b)(1) of the Act, 21  U.S.C. section 360bbb-3(b)(1), unless the authorization is terminated or revoked.  Performed at [facility], [address] [place], [place] [address]   Blood culture (routine x 2)     Status: None   Collection Time: 03/09/23  8:12 PM   Specimen: BLOOD RIGHT ARM  Result Value Ref Range Status   Specimen Description BLOOD RIGHT ARM  Final   Special Requests   Final    BOTTLES DRAWN AEROBIC AND ANAEROBIC Blood Culture adequate volume   Culture   Final    NO GROWTH 5 DAYS Performed at [facility], [address], [place], [place] [address]    Report Status 03/14/2023 FINAL  Final  Blood culture (routine x 2)     Status: None   Collection Time: 03/09/23  8:12 PM   Specimen: BLOOD LEFT ARM  Result Value Ref Range Status   Specimen Description BLOOD LEFT ARM  Final   Special Requests   Final    BOTTLES DRAWN AEROBIC AND ANAEROBIC Blood Culture adequate volume   Culture   Final    NO GROWTH 5 DAYS Performed at [facility], [address], [place], [place] [address]    Report Status 03/14/2023 FINAL  Final    Antimicrobials/Microbiology: Anti-infectives (From admission, onward)    None         Component Value Date/Time   SDES BLOOD RIGHT ARM 03/09/2023 2012   SDES BLOOD LEFT ARM 03/09/2023 2012   SPECREQUEST  03/09/2023 2012    BOTTLES DRAWN AEROBIC AND ANAEROBIC Blood Culture adequate volume   SPECREQUEST  03/09/2023 2012    BOTTLES DRAWN AEROBIC AND ANAEROBIC Blood Culture adequate volume   CULT  03/09/2023 2012    NO GROWTH 5 DAYS Performed at [facility], [address] [place], [place] [address]    CULT  03/09/2023 2012    NO GROWTH 5 DAYS Performed at [facility], [address], [place], [place] [address]    REPTSTATUS 03/14/2023 FINAL 03/09/2023 2012   REPTSTATUS 03/14/2023 FINAL 03/09/2023 2012   Radiology Studies: No results found.  LOS: 4 days   Total time spent in review of labs and imaging, patient evaluation,  formulation of plan, documentation and communication with family: 25 minutes  Lanae Boast, MD [facility]  03/14/2023, 12:40 PM

## 2023-03-14 NOTE — Plan of Care (Signed)
  Problem: Tissue Perfusion: Goal: Adequacy of tissue perfusion will improve Outcome: Progressing   Problem: Education: Goal: Ability to describe self-care measures that may prevent or decrease complications (Diabetes Survival Skills Education) will improve Outcome: Not Progressing   Problem: Coping: Goal: Ability to adjust to condition or change in health will improve Outcome: Not Progressing   Problem: Health Behavior/Discharge Planning: Goal: Ability to manage health-related needs will improve Outcome: Not Progressing   Problem: Education: Goal: Understanding of cardiac disease, CV risk reduction, and recovery process will improve Outcome: Not Progressing

## 2023-03-14 NOTE — Consult Note (Signed)
WOC Nurse Consult Note: Reason for Consult: Consult requested for back and arm. Left posterior back with black raised full thickness lesion; atypical in appearance; possible melanoma.  Surrounded by red moist full thickness wound with small amt bleeding. .5X.5X.1cm.   Right forearm arm with 2 areas of full thickness wounds red and moist with small amt bleeding; .5X.5X.2cm and .4X.4X.2cm  Dressing procedure/placement/frequency:  Recommend patient obtain a consult with dermatology after discharge to obtain a biopsy of the back lesion.  Topical treatment orders provided for bedside nurses to perform as follows: Foam dressing to left back and right arm, change Q 3 days or PRN soiling. Please re-consult if further assistance is needed.  Thank-you,  Cammie Mcgee MSN, RN, CWOCN, [place], CNS [phone]

## 2023-03-14 NOTE — Plan of Care (Signed)
  Problem: Education: Goal: Ability to describe self-care measures that may prevent or decrease complications (Diabetes Survival Skills Education) will improve Outcome: Progressing Goal: Individualized Educational Video(s) Outcome: Progressing   Problem: Coping: Goal: Ability to adjust to condition or change in health will improve Outcome: Progressing   Problem: Fluid Volume: Goal: Ability to maintain a balanced intake and output will improve Outcome: Progressing   Problem: Health Behavior/Discharge Planning: Goal: Ability to identify and utilize available resources and services will improve Outcome: Progressing Goal: Ability to manage health-related needs will improve Outcome: Progressing   Problem: Metabolic: Goal: Ability to maintain appropriate glucose levels will improve Outcome: Progressing   Problem: Nutritional: Goal: Maintenance of adequate nutrition will improve Outcome: Progressing Goal: Progress toward achieving an optimal weight will improve Outcome: Progressing   Problem: Skin Integrity: Goal: Risk for impaired skin integrity will decrease Outcome: Progressing   Problem: Tissue Perfusion: Goal: Adequacy of tissue perfusion will improve Outcome: Progressing   Problem: Education: Goal: Understanding of cardiac disease, CV risk reduction, and recovery process will improve Outcome: Progressing Goal: Individualized Educational Video(s) Outcome: Progressing   Problem: Activity: Goal: Ability to tolerate increased activity will improve Outcome: Progressing   Problem: Cardiac: Goal: Ability to achieve and maintain adequate cardiovascular perfusion will improve Outcome: Progressing   Problem: Health Behavior/Discharge Planning: Goal: Ability to safely manage health-related needs after discharge will improve Outcome: Progressing   Problem: Education: Goal: Knowledge of General Education information will improve Description: Including pain rating scale,  medication(s)/side effects and non-pharmacologic comfort measures Outcome: Progressing   Problem: Health Behavior/Discharge Planning: Goal: Ability to manage health-related needs will improve Outcome: Progressing   Problem: Clinical Measurements: Goal: Ability to maintain clinical measurements within normal limits will improve Outcome: Progressing Goal: Will remain free from infection Outcome: Progressing Goal: Diagnostic test results will improve Outcome: Progressing Goal: Respiratory complications will improve Outcome: Progressing Goal: Cardiovascular complication will be avoided Outcome: Progressing   Problem: Activity: Goal: Risk for activity intolerance will decrease Outcome: Progressing   Problem: Nutrition: Goal: Adequate nutrition will be maintained Outcome: Progressing   Problem: Coping: Goal: Level of anxiety will decrease Outcome: Progressing   Problem: Elimination: Goal: Will not experience complications related to bowel motility Outcome: Progressing Goal: Will not experience complications related to urinary retention Outcome: Progressing   Problem: Pain Management: Goal: General experience of comfort will improve Outcome: Progressing   Problem: Safety: Goal: Ability to remain free from injury will improve Outcome: Progressing   Problem: Skin Integrity: Goal: Risk for impaired skin integrity will decrease Outcome: Progressing

## 2023-03-14 NOTE — Hospital Course (Addendum)
67 y.o. 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  Patient presented secondary to chest pain concerning for possible cardiac etiology.  D-dimer elevated and VQ scan obtained which was significant for a right upper lobe pulmonary embolism.  Hospitalization also complicated by elevated AST/ALT/bilirubin with negative imaging.  Cardiology and gastroenterology consulted. He chest pain was found to be due to Acute PE, initially on heparin transitioned to Eliquis.GI recommended MRCP to definitively exclude choledocholithiasis but due to AKI unable to be performed.,  Creatinine has been fluctuating, discussed with Dr. Tomasa Rand from GI, given that concern for choledocholithiasis is essentially  zero at this point and his renal function is not showing any quick recovery he is planning for outpatient potential MRCP vs liver biopsy.  His Zio patch got lost> cardiology will be replacing at the time of discharge. Patient feels okay and ready for discharge home today, I had discussed with patient niece 12/29 regarding discharge for 12/30 PRN for different follow-ups for his ongoing care

## 2023-03-14 NOTE — TOC CM/SW Note (Signed)
Transition of Care [facility]) - Inpatient Brief Assessment   Patient Details  Name: Martin Thomas MRN: [id number] Date of Birth: 04-Apr-1955  [facility]) CM/SW Contact:    Kermit Balo, RN Phone Number: 03/14/2023, 3:47 PM   Clinical Narrative:  [facility] He [facility] his [facility]s through [facility] pharmacy. Will need them to go to local pharmacy at dc so he can have them same day.  Pt has transportation home when medically ready.  Transition of Care Asessment: Insurance and Status: Insurance coverage has been reviewed Patient has primary care physician: Yes Home environment has been reviewed: home with brother and niece   Prior/Current Home Services: No current home services Social Drivers of Health Review: SDOH reviewed no interventions necessary Readmission risk has been reviewed: Yes Transition of care needs: no transition of care needs at this time

## 2023-03-15 DIAGNOSIS — R079 Chest pain, unspecified: Secondary | ICD-10-CM | POA: Diagnosis not present

## 2023-03-15 DIAGNOSIS — N1831 Chronic kidney disease, stage 3a: Secondary | ICD-10-CM | POA: Diagnosis not present

## 2023-03-15 DIAGNOSIS — R7989 Other specified abnormal findings of blood chemistry: Secondary | ICD-10-CM | POA: Diagnosis not present

## 2023-03-15 DIAGNOSIS — N179 Acute kidney failure, unspecified: Secondary | ICD-10-CM | POA: Diagnosis not present

## 2023-03-15 LAB — COMPREHENSIVE METABOLIC PANEL
ALT: 117 U/L — ABNORMAL HIGH (ref 0–44)
AST: 52 U/L — ABNORMAL HIGH (ref 15–41)
Albumin: 2.3 g/dL — ABNORMAL LOW (ref 3.5–5.0)
Alkaline Phosphatase: 333 U/L — ABNORMAL HIGH (ref 38–126)
Anion gap: 9 (ref 5–15)
BUN: 36 mg/dL — ABNORMAL HIGH (ref 8–23)
CO2: 23 mmol/L (ref 22–32)
Calcium: 8.5 mg/dL — ABNORMAL LOW (ref 8.9–10.3)
Chloride: 103 mmol/L (ref 98–111)
Creatinine, Ser: 1.99 mg/dL — ABNORMAL HIGH (ref 0.61–1.24)
GFR, Estimated: 36 mL/min — ABNORMAL LOW (ref >60)
Glucose, Bld: 186 mg/dL — ABNORMAL HIGH (ref 70–99)
Potassium: 4.7 mmol/L (ref 3.5–5.1)
Sodium: 135 mmol/L (ref 135–145)
Total Bilirubin: 0.4 mg/dL (ref <1.2)
Total Protein: 5.5 g/dL — ABNORMAL LOW (ref 6.5–8.1)

## 2023-03-15 LAB — CBC
HCT: 30.7 % — ABNORMAL LOW (ref 39.0–52.0)
Hemoglobin: 10.3 g/dL — ABNORMAL LOW (ref 13.0–17.0)
MCH: 32.3 pg (ref 26.0–34.0)
MCHC: 33.6 g/dL (ref 30.0–36.0)
MCV: 96.2 fL (ref 80.0–100.0)
Platelets: 339 10*3/uL (ref 150–400)
RBC: 3.19 MIL/uL — ABNORMAL LOW (ref 4.22–5.81)
RDW: 14.7 % (ref 11.5–15.5)
WBC: 9.7 10*3/uL (ref 4.0–10.5)
nRBC: 0 % (ref 0.0–0.2)

## 2023-03-15 LAB — GLUCOSE, CAPILLARY
Glucose-Capillary: 162 mg/dL — ABNORMAL HIGH (ref 70–99)
Glucose-Capillary: 212 mg/dL — ABNORMAL HIGH (ref 70–99)
Glucose-Capillary: 182 mg/dL — ABNORMAL HIGH (ref 70–99)
Glucose-Capillary: 192 mg/dL — ABNORMAL HIGH (ref 70–99)

## 2023-03-15 MED ORDER — DEXTROSE-SODIUM CHLORIDE 5-0.45 % IV SOLN: INTRAVENOUS | Status: AC

## 2023-03-15 NOTE — Progress Notes (Signed)
Notified by nurse that the previously placed ZIO AT patch was lost. Patient will stay over the weekend and likely has a procedure next week. Will need to order another ZIO AT monitor in the morning of the discharge. Requested primary team to notify us when he is ready to be discharged.

## 2023-03-15 NOTE — Plan of Care (Signed)
  Problem: Fluid Volume: Goal: Ability to maintain a balanced intake and output will improve Outcome: Progressing   Problem: Metabolic: Goal: Ability to maintain appropriate glucose levels will improve Outcome: Progressing   Problem: Skin Integrity: Goal: Risk for impaired skin integrity will decrease Outcome: Progressing   Problem: Activity: Goal: Ability to tolerate increased activity will improve Outcome: Progressing   Problem: Clinical Measurements: Goal: Cardiovascular complication will be avoided Outcome: Progressing   Problem: Safety: Goal: Ability to remain free from injury will improve Outcome: Progressing

## 2023-03-15 NOTE — Discharge Instructions (Signed)
Information on my medicine - ELIQUIS (apixaban)  This medication education was reviewed with me or my healthcare representative as part of my discharge preparation.    Why was Eliquis prescribed for you? Eliquis was prescribed to treat blood clots that may have been found in the veins of your legs (deep vein thrombosis) or in your lungs (pulmonary embolism) and to reduce the risk of them occurring again.  What do You need to know about Eliquis ? The starting dose is 10 mg (two 5 mg tablets) taken TWICE daily for the FIRST SEVEN (7) DAYS, then on (enter date)  03/20/23  the dose is reduced to ONE 5 mg tablet taken TWICE daily.  Eliquis may be taken with or without food.   Try to take the dose about the same time in the morning and in the evening. If you have difficulty swallowing the tablet whole please discuss with your pharmacist how to take the medication safely.  Take Eliquis exactly as prescribed and DO NOT stop taking Eliquis without talking to the doctor who prescribed the medication.  Stopping may increase your risk of developing a new blood clot.  Refill your prescription before you run out.  After discharge, you should have regular check-up appointments with your healthcare provider that is prescribing your Eliquis.    What do you do if you miss a dose? If a dose of ELIQUIS is not taken at the scheduled time, take it as soon as possible on the same day and twice-daily administration should be resumed. The dose should not be doubled to make up for a missed dose.  Important Safety Information A possible side effect of Eliquis is bleeding. You should call your healthcare provider right away if you experience any of the following: Bleeding from an injury or your nose that does not stop. Unusual colored urine (red or dark brown) or unusual colored stools (red or black). Unusual bruising for unknown reasons. A serious fall or if you hit your head (even if there is no  bleeding).  Some medicines may interact with Eliquis and might increase your risk of bleeding or clotting while on Eliquis. To help avoid this, consult your healthcare provider or pharmacist prior to using any new prescription or non-prescription medications, including herbals, vitamins, non-steroidal anti-inflammatory drugs (NSAIDs) and supplements.  This website has more information on Eliquis (apixaban): http://www.eliquis.com/eliquis/home

## 2023-03-15 NOTE — Progress Notes (Signed)
[facility] ROUNDING NOTE   Subjective: Patient feeling well today.  Denies any GI symptoms.  T. bili has completely normalized, alk phos continues to remain elevated but stable   Objective: Vital signs in last 24 hours: Temp:  [97.6 F (36.4 C)-99.1 F (37.3 C)] 98.6 F (37 C) (12/28 1134) Pulse Rate:  [78-112] 112 (12/28 1134) Resp:  [13-32] 32 (12/28 1134) BP: (105-123)/(33-73) 123/73 (12/28 1134) SpO2:  [96 %-100 %] 100 % (12/28 1134) Weight:  [63.5 kg] 63.5 kg (12/28 0300) Last BM Date : 03/14/23 General: NAD, pleasant Caucasian male Lungs:  CTA b/l, no w/r/r Heart:  RRR, no m/r/g Abdomen:  Soft, NT, ND, +BS    Intake/Output from previous day: 12/27 0701 - 12/28 0700 In: 900 [P.O.:900] Out: 1700 [Urine:1700] Intake/Output this shift: No intake/output data recorded.   Lab Results: Recent Labs    03/13/23 0233 03/14/23 0240 03/15/23 0227  WBC 8.4 8.8 9.7  HGB 11.4* 11.6* 10.3*  PLT 309 347 339  MCV 95.7 96.1 96.2   BMET Recent Labs    03/13/23 0234 03/14/23 0240 03/15/23 0227  NA 135 134* 135  K 4.2 4.7 4.7  CL 101 104 103  CO2 25 22 23   GLUCOSE 187* 122* 186*  BUN 36* 30* 36*  CREATININE 1.59* 1.77* 1.99*  CALCIUM 8.1* 8.4* 8.5*   LFT Recent Labs    03/13/23 0234 03/14/23 0240 03/15/23 0227  PROT 5.6* 5.8* 5.5*  ALBUMIN 2.5* 2.6* 2.3*  AST 127* 61* 52*  ALT 242* 173* 117*  ALKPHOS 336* 287* 333*  BILITOT 0.7 0.8 0.4   PT/INR No results for input(s): "INR" in the last 72 hours.    Imaging/Other results: No results found.    Assessment and Plan:  67 year old male with multiple comorbidities to include coronary artery disease status post MI with DES, CHF, COPD, admitted with chest pain, acute kidney injury and pulmonary embolism.  Also with elevated liver enzymes of unclear etiology.  He was initially having some abdominal pain, and had ultrasound findings with gallstones, concerning for possible choledocholithiasis.   However bilirubin has completely normalized and he has not had any further pain.  He does have chronic liver enzyme elevations, alk phos predominant, of unclear etiology. An MRCP had been recommended to definitively exclude choledocholithiasis and also elevated for chronic biliary disease to explain his chronic alk phos elevation.  However, his renal function has not improved to the point where a nonemergent MRCP would be safe.  Given that the concern for choledocholithiasis is essentially zero at this point and his renal function is not showing any quick recovery, we will plan for potential MRCP versus liver biopsy as an outpatient.  GI will sign off at this time.  We will arrange outpatient follow-up to discuss MRCP versus liver biopsy.  Please reconsult if the patient's liver enzymes worsen significantly.    Jenel Lucks, MD  03/15/2023, 3:50 PM [facility]

## 2023-03-15 NOTE — Plan of Care (Signed)
  Problem: Education: Goal: Ability to describe self-care measures that may prevent or decrease complications (Diabetes Survival Skills Education) will improve Outcome: Progressing Goal: Individualized Educational Video(s) Outcome: Progressing   Problem: Coping: Goal: Ability to adjust to condition or change in health will improve Outcome: Progressing   Problem: Fluid Volume: Goal: Ability to maintain a balanced intake and output will improve Outcome: Progressing   Problem: Health Behavior/Discharge Planning: Goal: Ability to identify and utilize available resources and services will improve Outcome: Progressing Goal: Ability to manage health-related needs will improve Outcome: Progressing   Problem: Metabolic: Goal: Ability to maintain appropriate glucose levels will improve Outcome: Progressing   Problem: Nutritional: Goal: Maintenance of adequate nutrition will improve Outcome: Progressing Goal: Progress toward achieving an optimal weight will improve Outcome: Progressing   Problem: Skin Integrity: Goal: Risk for impaired skin integrity will decrease Outcome: Progressing   Problem: Tissue Perfusion: Goal: Adequacy of tissue perfusion will improve Outcome: Progressing   Problem: Education: Goal: Understanding of cardiac disease, CV risk reduction, and recovery process will improve Outcome: Progressing Goal: Individualized Educational Video(s) Outcome: Progressing   Problem: Activity: Goal: Ability to tolerate increased activity will improve Outcome: Progressing   Problem: Cardiac: Goal: Ability to achieve and maintain adequate cardiovascular perfusion will improve Outcome: Progressing   Problem: Health Behavior/Discharge Planning: Goal: Ability to safely manage health-related needs after discharge will improve Outcome: Progressing   Problem: Education: Goal: Knowledge of General Education information will improve Description: Including pain rating scale,  medication(s)/side effects and non-pharmacologic comfort measures Outcome: Progressing   Problem: Health Behavior/Discharge Planning: Goal: Ability to manage health-related needs will improve Outcome: Progressing   Problem: Clinical Measurements: Goal: Ability to maintain clinical measurements within normal limits will improve Outcome: Progressing Goal: Will remain free from infection Outcome: Progressing Goal: Diagnostic test results will improve Outcome: Progressing Goal: Respiratory complications will improve Outcome: Progressing Goal: Cardiovascular complication will be avoided Outcome: Progressing   Problem: Activity: Goal: Risk for activity intolerance will decrease Outcome: Progressing   Problem: Nutrition: Goal: Adequate nutrition will be maintained Outcome: Progressing   Problem: Coping: Goal: Level of anxiety will decrease Outcome: Progressing   Problem: Elimination: Goal: Will not experience complications related to bowel motility Outcome: Progressing Goal: Will not experience complications related to urinary retention Outcome: Progressing   Problem: Pain Management: Goal: General experience of comfort will improve Outcome: Progressing   Problem: Safety: Goal: Ability to remain free from injury will improve Outcome: Progressing   Problem: Skin Integrity: Goal: Risk for impaired skin integrity will decrease Outcome: Progressing

## 2023-03-15 NOTE — Progress Notes (Signed)
PROGRESS NOTE Martin Thomas  WUJ:[id number] DOB: [birth year] DOA: 03/09/2023 PCP: Default, Provider, MD  Brief Narrative/Hospital Course: 67 y.o. 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  Patient presented secondary to chest pain concerning for possible cardiac etiology.  D-dimer elevated and VQ scan obtained which was significant for a right upper lobe pulmonary embolism.  Hospitalization also complicated by elevated AST/ALT/bilirubin with negative imaging.  Cardiology and gastroenterology consulted. He chest pain was found to be due to Acute PE, initially on heparin transitioned to Eliquis.  GI planning for MRCP for elevated LFTs.    Subjective: Patient seen and examined this morning Overnight afebrile BP remained stable in 105-120s, no episode of hypotension  Labs shows LFTs continue to improve but creatinine trending up 1.7> 1.9 CBC with normocytic anemia.  Not on NSAIDs Lasix. UOP 1700. Po intake-charted to be 100% Reports he is doing well no shortness of breath abdominal pain chest pain fever chills  Assessment and Plan: Principal Problem:   Chest pain Active Problems:   Transaminitis   AKI (acute kidney injury) (HCC)   Heart failure with mildly reduced ejection fraction (HFmrEF) (HCC)   DM2 (diabetes mellitus, type 2) (HCC)   HTN (hypertension)   High cholesterol   CKD stage 3a, GFR 45-59 ml/min (HCC)   Pulmonary nodule 1 cm or greater in diameter   Elevated liver function tests   Pulmonary embolus (HCC)   Precordial chest pain   Elevated troponin   HFrEF (heart failure with reduced ejection fraction) (HCC)   Hyponatremia   Nausea and vomiting in adult   Acute RUL PE Elevated troponin due to demand ischemia: CAD/NSTEMI in may  status post PCI to RCA with DES: Presents with chest pain tachycardia elevated troponin and found to have acute PE-seen by cardiology, CKD precludes  cardiac cath mild decline in EF at 35-40% from previous 45-50% noted.  Chest pain resolved, continue with Brilinta, aspirin stopped as patient on DOAC. On completion of PE treatment start back on aspirin 81 as outpatient. Statin on hold due to transaminitis, if further chest pain consider PET stress CT as outpatient.  Cardiology signed off -he had Zio patch placed by cardiology but came off and got lost 12/27 night- will need 1 before discharge- cardio aware.   Acute renal failure: B/l creat ~ 1.3-1.6. was 2.65 on admission likely secondary to prerenal congestion and hypovolemia, nausea vomiting.  CT imaging without evidence of hydronephrosis or ureteral obstruction.Patient started on IV fluids with some initial improvement -tolerating p.o. well, creatinine level fluctuating, uptrending,-> cardiorenal syndrome with this EF of 35-40% . Will start gentle IVF x 18 hrs , encourage oral intake, avoid NSAIDs nephrotoxic medication  Recent Labs    08/04/22 1750 08/04/22 1800 08/05/22 0700 03/09/23 1422 03/09/23 2311 03/10/23 0729 03/11/23 0240 03/12/23 1340 03/13/23 0233 03/13/23 0234 03/14/23 0240 03/15/23 0227  BUN 18 25* 18 73* 67* 60* 59* 42*  --  36* 30* 36*  CREATININE 1.33* 1.30* 1.42* 2.65* 2.42* 2.25* 2.07* 1.68*  --  1.59* 1.77* 1.99*  CO2 24  --  27 20* 20* 21* 25 25  --  25 22 23   K 5.5* 5.7* 4.2 3.9 3.6 3.7 3.5 4.2 4.2 4.2 4.7 4.7    Transaminitis: Patient asymptomatic without abdominal pain [place] Ruq 12/22 showed-cholelithiasis. Gi advised MRCP-but holding due to AKI. autoimmune workup viral serologies unremarkable.  LFTs fortunately improving nicely.  HFrEF: Previous EF  45-50% Mildly reduced to 35-40%, seen by cardiology GDMT-continue due to patient's renal dysfunction.Cont on Toprol  SVT: Continue Toprol  RLL pulm nodule: Incidentally noted on CT 14 mm slightly decreased compared to prior, follow-up outpatient  HLD: Statins on hold  HTN: Continue metoprolol losartan on  hold due to AKI  Diabetes mellitus T2: Patient is on Jardiance and insuline glargine .  Uncontrolled hyperglycemia overall improving on SSI and current semglee 5 u, A1c  is 8.2%. Continue SSI and Semglee 5 units daily. Recent Labs  Lab 03/10/23 0038 03/10/23 0808 03/14/23 0726 03/14/23 1119 03/14/23 1503 03/14/23 2047 03/15/23 0607  GLUCAP  --    < > 118* 251* 173* 148* 162*  HGBA1C 8.2*  --   --   --   --   --   --    < > = values in this interval not displayed.    Ambulatory dysfunction LT AKA: WC bound since 2019.  DVT prophylaxis: eliquis Code Status:   Code Status: Full Code Family Communication: plan of care discussed with patient at bedside. Patient status is: Remains hospitalized because of severity of illness Level of care: Telemetry Cardiac   Dispo: The patient is from: home with brother            Anticipated disposition: TBD Objective: Vitals last 24 hrs: Vitals:   03/15/23 0300 03/15/23 0414 03/15/23 0800 03/15/23 0900  BP: (!) 105/33 (!) 120/54  (!) 122/45  Pulse: 80   78  Resp: 20  17 13   Temp: 98.8 F (37.1 C)   97.6 F (36.4 C)  TempSrc: Oral   Axillary  SpO2: 96%   97%  Weight: 63.5 kg     Height:       Weight change:   Physical Examination: General exam: alert awake, ill-appearing, frail  HEENT:Oral mucosa moist, Ear/Nose WNL grossly Respiratory system: Bilaterally clear BS,no use of accessory muscle Cardiovascular system: S1 & S2 +, No JVD. Gastrointestinal system: Abdomen soft,NT,ND, BS+ Nervous System: Alert, awake, moving all extremities,and following commands. Extremities: LE edema neg, lt AKA status Skin: No rashes,no icterus. MSK: Normal muscle bulk,tone, power   Medications reviewed:  Scheduled Meds:  apixaban  10 mg Oral BID   Followed by   Melene Muller ON 03/20/2023] apixaban  5 mg Oral BID   insulin aspart  0-9 Units Subcutaneous TID WC   insulin glargine-yfgn  5 Units Subcutaneous Daily   metoprolol succinate  25 mg Oral QHS    metoprolol succinate  50 mg Oral Daily   mirtazapine  30 mg Oral QHS   ticagrelor  90 mg Oral BID   Continuous Infusions:    Diet Order             Diet Carb Modified Fluid consistency: Thin; Room service appropriate? Yes  Diet effective now                  Intake/Output Summary (Last 24 hours) at 03/15/2023 1044 Last data filed at 03/15/2023 0409 Gross per 24 hour  Intake 780 ml  Output 1700 ml  Net -920 ml   Net IO Since Admission: -2,982.46 mL [03/15/23 1044]  Wt Readings from Last 3 Encounters:  03/15/23 63.5 kg  08/04/22 83.5 kg  07/23/22 81.4 kg     Unresulted Labs (From admission, onward)     Start     Ordered   03/13/23 0500  CBC  Daily,   R     Question:  Specimen collection method  Answer:  Lab=Lab collect  Placed in "And" Linked Group   03/12/23 2103   03/12/23 0736  Comprehensive metabolic panel  Daily,   R      03/12/23 0735          Data Reviewed: I have personally reviewed following labs and imaging studies CBC: Recent Labs  Lab 03/11/23 0240 03/12/23 0230 03/13/23 0233 03/14/23 0240 03/15/23 0227  WBC 9.6 9.6 8.4 8.8 9.7  HGB 12.9* 11.5* 11.4* 11.6* 10.3*  HCT 36.8* 33.4* 33.6* 34.5* 30.7*  MCV 92.9 93.8 95.7 96.1 96.2  PLT 320 288 309 347 339   Basic Metabolic Panel:  Recent Labs  Lab 03/09/23 2311 03/10/23 0729 03/11/23 0240 03/11/23 1522 03/12/23 1340 03/13/23 0233 03/13/23 0234 03/14/23 0240 03/15/23 0227  NA 127*   < > 127* 132* 136  --  135 134* 135  K 3.6   < > 3.5  --  4.2 4.2 4.2 4.7 4.7  CL 95*   < > 93*  --  97*  --  101 104 103  CO2 20*   < > 25  --  25  --  25 22 23   GLUCOSE 116*   < > 104*  --  195*  --  187* 122* 186*  BUN 67*   < > 59*  --  42*  --  36* 30* 36*  CREATININE 2.42*   < > 2.07*  --  1.68*  --  1.59* 1.77* 1.99*  CALCIUM 7.6*   < > 7.9*  --  8.2*  --  8.1* 8.4* 8.5*  MG 1.6*  --   --   --   --  1.6*  --   --   --    < > = values in this interval not displayed.   GFR: Estimated Creatinine  Clearance: 32.8 mL/min (A) (by C-G formula based on SCr of 1.99 mg/dL (H)). Liver Function Tests:  Recent Labs  Lab 03/11/23 0240 03/12/23 1340 03/13/23 0234 03/14/23 0240 03/15/23 0227  AST 480* 134* 127* 61* 52*  ALT 530* 273* 242* 173* 117*  ALKPHOS 230* 219* 336* 287* 333*  BILITOT 0.9 0.7 0.7 0.8 0.4  PROT 5.9* 5.2* 5.6* 5.8* 5.5*  ALBUMIN 2.9* 2.4* 2.5* 2.6* 2.3*   No results for input(s): "LIPASE", "AMYLASE" in the last 168 hours. No results for input(s): "AMMONIA" in the last 168 hours. Coagulation Profile:  Recent Labs  Lab 03/10/23 1611  INR 1.0   Recent Results (from the past 240 hours)  Resp panel by RT-PCR (RSV, Flu A&B, Covid) Anterior Nasal Swab     Status: None   Collection Time: 03/09/23  5:21 PM   Specimen: Anterior Nasal Swab  Result Value Ref Range Status   SARS Coronavirus 2 by RT PCR NEGATIVE NEGATIVE Final   Influenza A by PCR NEGATIVE NEGATIVE Final   Influenza B by PCR NEGATIVE NEGATIVE Final    Comment: (NOTE) The Xpert Xpress SARS-CoV-2/FLU/RSV plus assay is intended as an aid in the diagnosis of influenza from Nasopharyngeal swab specimens and should not be used as a sole basis for treatment. Nasal washings and aspirates are unacceptable for Xpert Xpress SARS-CoV-2/FLU/RSV testing.  Fact Sheet for Patients: BloggerCourse.com  Fact Sheet for Healthcare Providers: SeriousBroker.it  This test is not yet approved or cleared by the [place] FDA and has been authorized for detection and/or diagnosis of SARS-CoV-2 by FDA under an Emergency Use Authorization (EUA). This EUA will remain in effect (meaning this test  can be used) for the duration of the COVID-19 declaration under Section 564(b)(1) of the Act, 21 U.S.C. section 360bbb-3(b)(1), unless the authorization is terminated or revoked.     Resp Syncytial Virus by PCR NEGATIVE NEGATIVE Final    Comment: (NOTE) Fact Sheet for  Patients: BloggerCourse.com  Fact Sheet for Healthcare Providers: SeriousBroker.it  This test is not yet approved or cleared by the [place] FDA and has been authorized for detection and/or diagnosis of SARS-CoV-2 by FDA under an Emergency Use Authorization (EUA). This EUA will remain in effect (meaning this test can be used) for the duration of the COVID-19 declaration under Section 564(b)(1) of the Act, 21 U.S.C. section 360bbb-3(b)(1), unless the authorization is terminated or revoked.  Performed at [facility], [address] [place], [place] [address]   Blood culture (routine x 2)     Status: None   Collection Time: 03/09/23  8:12 PM   Specimen: BLOOD RIGHT ARM  Result Value Ref Range Status   Specimen Description BLOOD RIGHT ARM  Final   Special Requests   Final    BOTTLES DRAWN AEROBIC AND ANAEROBIC Blood Culture adequate volume   Culture   Final    NO GROWTH 5 DAYS Performed at [facility], [address] [place], [place] [address]    Report Status 03/14/2023 FINAL  Final  Blood culture (routine x 2)     Status: None   Collection Time: 03/09/23  8:12 PM   Specimen: BLOOD LEFT ARM  Result Value Ref Range Status   Specimen Description BLOOD LEFT ARM  Final   Special Requests   Final    BOTTLES DRAWN AEROBIC AND ANAEROBIC Blood Culture adequate volume   Culture   Final    NO GROWTH 5 DAYS Performed at [facility], [address], [place], [place] [address]    Report Status 03/14/2023 FINAL  Final    Antimicrobials/Microbiology: Anti-infectives (From admission, onward)    None         Component Value Date/Time   SDES BLOOD RIGHT ARM 03/09/2023 2012   SDES BLOOD LEFT ARM 03/09/2023 2012   SPECREQUEST  03/09/2023 2012    BOTTLES DRAWN AEROBIC AND ANAEROBIC Blood Culture adequate volume   SPECREQUEST  03/09/2023 2012    BOTTLES DRAWN AEROBIC AND ANAEROBIC Blood Culture adequate volume    CULT  03/09/2023 2012    NO GROWTH 5 DAYS Performed at [facility], [address], [place], [place] [address]    CULT  03/09/2023 2012    NO GROWTH 5 DAYS Performed at [facility], [address], [place], [place] [address]    REPTSTATUS 03/14/2023 FINAL 03/09/2023 2012   REPTSTATUS 03/14/2023 FINAL 03/09/2023 2012   Radiology Studies: No results found.  LOS: 5 days   Total time spent in review of labs and imaging, patient evaluation, formulation of plan, documentation and communication with family: 35 minutes  Lanae Boast, MD [facility]  03/15/2023, 10:44 AM

## 2023-03-16 DIAGNOSIS — R079 Chest pain, unspecified: Secondary | ICD-10-CM | POA: Diagnosis not present

## 2023-03-16 LAB — COMPREHENSIVE METABOLIC PANEL
ALT: 90 U/L — ABNORMAL HIGH (ref 0–44)
AST: 36 U/L (ref 15–41)
Albumin: 2.3 g/dL — ABNORMAL LOW (ref 3.5–5.0)
Alkaline Phosphatase: 297 U/L — ABNORMAL HIGH (ref 38–126)
Anion gap: 7 (ref 5–15)
BUN: 36 mg/dL — ABNORMAL HIGH (ref 8–23)
CO2: 19 mmol/L — ABNORMAL LOW (ref 22–32)
Calcium: 8.5 mg/dL — ABNORMAL LOW (ref 8.9–10.3)
Chloride: 104 mmol/L (ref 98–111)
Creatinine, Ser: 1.71 mg/dL — ABNORMAL HIGH (ref 0.61–1.24)
GFR, Estimated: 44 mL/min — ABNORMAL LOW (ref >60)
Glucose, Bld: 183 mg/dL — ABNORMAL HIGH (ref 70–99)
Potassium: 5.1 mmol/L (ref 3.5–5.1)
Sodium: 130 mmol/L — ABNORMAL LOW (ref 135–145)
Total Bilirubin: 0.4 mg/dL (ref <1.2)
Total Protein: 5.4 g/dL — ABNORMAL LOW (ref 6.5–8.1)

## 2023-03-16 LAB — CBC
HCT: 28.3 % — ABNORMAL LOW (ref 39.0–52.0)
Hemoglobin: 9.6 g/dL — ABNORMAL LOW (ref 13.0–17.0)
MCH: 32.7 pg (ref 26.0–34.0)
MCHC: 33.9 g/dL (ref 30.0–36.0)
MCV: 96.3 fL (ref 80.0–100.0)
Platelets: 348 10*3/uL (ref 150–400)
RBC: 2.94 MIL/uL — ABNORMAL LOW (ref 4.22–5.81)
RDW: 14.8 % (ref 11.5–15.5)
WBC: 8.8 10*3/uL (ref 4.0–10.5)
nRBC: 0 % (ref 0.0–0.2)

## 2023-03-16 LAB — GLUCOSE, CAPILLARY
Glucose-Capillary: 146 mg/dL — ABNORMAL HIGH (ref 70–99)
Glucose-Capillary: 198 mg/dL — ABNORMAL HIGH (ref 70–99)
Glucose-Capillary: 183 mg/dL — ABNORMAL HIGH (ref 70–99)
Glucose-Capillary: 136 mg/dL — ABNORMAL HIGH (ref 70–99)

## 2023-03-16 NOTE — Progress Notes (Signed)
PROGRESS NOTE Martin Thomas  BJY:[id number] DOB: [birth year] DOA: 03/09/2023 PCP: Default, Provider, MD  Brief Narrative/Hospital Course: 67 y.o. 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  Patient presented secondary to chest pain concerning for possible cardiac etiology.  D-dimer elevated and VQ scan obtained which was significant for a right upper lobe pulmonary embolism.  Hospitalization also complicated by elevated AST/ALT/bilirubin with negative imaging.  Cardiology and gastroenterology consulted. He chest pain was found to be due to Acute PE, initially on heparin transitioned to Eliquis.GI recommended MRCP to definitively exclude choledocholithiasis but due to AKI unable to be performed.,  Creatinine has been fluctuating, discussed with Dr. Tomasa Rand from GI, given that concern for choledocholithiasis is essentially  zero at this point and his renal function is not showing any quick recovery he is planning for outpatient potential MRCP vs liver biopsy.  His Zio patch got lost> cardiology will be replacing at the time of discharge    Subjective: Seen examined this am Resting well and no new complaints Overnight afebrile BP stable  Labs shows LFTs continue to improve , creatinine trending up 1.7> 1.9> 1.7 CBC with normocytic anemia.Not on NSAIDs Lasix.   Assessment and Plan: Principal Problem:   Chest pain Active Problems:   Transaminitis   AKI (acute kidney injury) (HCC)   Heart failure with mildly reduced ejection fraction (HFmrEF) (HCC)   DM2 (diabetes mellitus, type 2) (HCC)   HTN (hypertension)   High cholesterol   CKD stage 3a, GFR 45-59 ml/min (HCC)   Pulmonary nodule 1 cm or greater in diameter   Elevated liver function tests   Pulmonary embolus (HCC)   Precordial chest pain   Elevated troponin   HFrEF (heart failure with reduced ejection fraction) (HCC)   Hyponatremia   Nausea  and vomiting in adult   Acute RUL PE Elevated troponin due to demand ischemia: CAD/NSTEMI in may  status post PCI to RCA with DES: Presents with chest pain tachycardia elevated troponin and found to have acute PE-seen by cardiology, CKD precludes cardiac cath mild decline in EF at 35-40% from previous 45-50% noted. Chest pain resolved, continue with Brilinta, aspirin stopped as patient on DOAC. On completion of PE treatment start back on aspirin 81 as outpatient. Statin on hold due to transaminitis, if further chest pain consider PET stress CT as outpatient.  Cardiology signed off -he had Zio patch placed by cardiology but came off and got lost 12/27 night- will need 1 before discharge-cardio aware.  Acute renal failure: B/l creat ~ 1.3-1.6. was 2.65>on admission likely secondary to prerenal and hypovolemia, nausea vomiting.  CT imaging without evidence of hydronephrosis or ureteral obstruction.some improvement with IV fluids initially. ?cardiorenal syndrome with this EF of 35-40%.  With gentle IVF overnight creatinine further down to 1.7 likely his baseline .  Continue to encourage oral intake avoid NSAIDs nephrotoxic agent  and monitor next 24 hours Recent Labs    08/05/22 0700 03/09/23 1422 03/09/23 2311 03/10/23 0729 03/11/23 0240 03/12/23 1340 03/13/23 0233 03/13/23 0234 03/14/23 0240 03/15/23 0227 03/16/23 0204  BUN 18 73* 67* 60* 59* 42*  --  36* 30* 36* 36*  CREATININE 1.42* 2.65* 2.42* 2.25* 2.07* 1.68*  --  1.59* 1.77* 1.99* 1.71*  CO2 27 20* 20* 21* 25 25  --  25 22 23  19*  K 4.2 3.9 3.6 3.7 3.5 4.2 4.2 4.2 4.7 4.7 5.1   Transaminitis/liver dysfunction: Patient  asymptomatic without abdominal pain [place] Ruq 12/22 showed-cholelithiasis. GI advised MRCP - but holding due to AKI. Autoimmune workup viral serologies unremarkable Hep IgAb +, IgM neg. LFTs improving nicely. discussed with Dr. Tomasa Rand from GI, given that concern for choledocholithiasis is essentially zero at this point  and his renal function is not showing any quick recovery he is planning for outpatient potential MRCP versus liver biopsy and okay for discharge home  HFrEF: Previous EF 45-50% Mildly reduced to 35-40%, seen by cardiology GDMT-continue due to patient's renal dysfunction. Cont on Toprol  SVT: Continue Toprol  RLL pulm nodule: Incidentally noted on CT 14 mm slightly decreased compared to prior, follow-up outpatient.  HLD: Statins on hold  HTN: Continue metoprolol losartan on hold due to AKI  Diabetes mellitus T2: Patient is on Jardiance and insuline glargine .  Uncontrolled hyperglycemia overall improving on SSI and current semglee 5 u, A1c  is 8.2%. Continue SSI and Semglee 5 units daily.  Insulin resume home meds upon discharge Recent Labs  Lab 03/10/23 0038 03/10/23 0808 03/15/23 1131 03/15/23 1550 03/15/23 2045 03/16/23 0612 03/16/23 1154  GLUCAP  --    < > 212* 182* 192* 146* 198*  HGBA1C 8.2*  --   --   --   --   --   --    < > = values in this interval not displayed.   Ambulatory dysfunction Left AKA: WBC bound since 2019.  ?Cognitive impairment: Niece is concerned.   Longstanding skin lesion on back: Having this lesion for long time as per niece- cont dressing. I have advised fu with derm/ pcp for biopsy to rule out malignancy niece is aware.   DVT prophylaxis: eliquis Code Status:   Code Status: Full Code Family Communication: plan of care discussed with patient at bedside. Updated niece on phone. Patient status is: Remains hospitalized because of severity of illness Level of care: Telemetry Cardiac.  Dispo:The patient is from: home with brother            Anticipated disposition: Anticipate discharge tomorrow after placing Zio patch will arrange Eliquis  from [facility] pharmacy.  Objective: Vitals last 24 hrs: Vitals:   03/16/23 0439 03/16/23 0814 03/16/23 0952 03/16/23 1240  BP: (!) 124/42 (!) 127/36 (!) 125/52 (!) 121/46  Pulse: 78 80 84 76  Resp:  18  18   Temp: 98.6 F (37 C) 99.1 F (37.3 C)  98.8 F (37.1 C)  TempSrc: Oral Oral  Oral  SpO2: 98% 100%  97%  Weight: 64.3 kg     Height:       Weight change: 0.8 kg  Physical Examination: General exam: alert awake, pleasant HEENT:Oral mucosa moist, Ear/Nose WNL grossly Respiratory system: Bilaterally clear BS,no use of accessory muscle Cardiovascular system: S1 & S2 +, No JVD. Gastrointestinal system: Abdomen soft,NT,ND, BS+ Nervous System: Alert, awake, moving all extremities,and following commands. Extremities: Left AKA,distal peripheral pulses palpable and warm.  Skin: No rashes,no icterus. MSK: Normal muscle bulk,tone, power   Medications reviewed:  Scheduled Meds:  apixaban  10 mg Oral BID   Followed by   Melene Muller ON 03/20/2023] apixaban  5 mg Oral BID   insulin aspart  0-9 Units Subcutaneous TID WC   insulin glargine-yfgn  5 Units Subcutaneous Daily   metoprolol succinate  25 mg Oral QHS   metoprolol succinate  50 mg Oral Daily   mirtazapine  30 mg Oral QHS   ticagrelor  90 mg Oral BID   Continuous Infusions:  Diet Order             Diet Carb Modified Fluid consistency: Thin; Room service appropriate? Yes  Diet effective now                  Intake/Output Summary (Last 24 hours) at 03/16/2023 1426 Last data filed at 03/16/2023 1237 Gross per 24 hour  Intake 1080 ml  Output 2850 ml  Net -1770 ml   Net IO Since Admission: -5,152.46 mL [03/16/23 1426]  Wt Readings from Last 3 Encounters:  03/16/23 64.3 kg  08/04/22 83.5 kg  07/23/22 81.4 kg     Unresulted Labs (From admission, onward)     Start     Ordered   03/13/23 0500  CBC  Daily,   R     Question:  Specimen collection method  Answer:  Lab=Lab collect  Placed in "And" Linked Group   03/12/23 2103          Data Reviewed: I have personally reviewed following labs and imaging studies CBC: Recent Labs  Lab 03/12/23 0230 03/13/23 0233 03/14/23 0240 03/15/23 0227 03/16/23 0204  WBC 9.6  8.4 8.8 9.7 8.8  HGB 11.5* 11.4* 11.6* 10.3* 9.6*  HCT 33.4* 33.6* 34.5* 30.7* 28.3*  MCV 93.8 95.7 96.1 96.2 96.3  PLT 288 309 347 339 348   Basic Metabolic Panel:  Recent Labs  Lab 03/09/23 2311 03/10/23 0729 03/12/23 1340 03/13/23 0233 03/13/23 0234 03/14/23 0240 03/15/23 0227 03/16/23 0204  NA 127*   < > 136  --  135 134* 135 130*  K 3.6   < > 4.2 4.2 4.2 4.7 4.7 5.1  CL 95*   < > 97*  --  101 104 103 104  CO2 20*   < > 25  --  25 22 23  19*  GLUCOSE 116*   < > 195*  --  187* 122* 186* 183*  BUN 67*   < > 42*  --  36* 30* 36* 36*  CREATININE 2.42*   < > 1.68*  --  1.59* 1.77* 1.99* 1.71*  CALCIUM 7.6*   < > 8.2*  --  8.1* 8.4* 8.5* 8.5*  MG 1.6*  --   --  1.6*  --   --   --   --    < > = values in this interval not displayed.   GFR: Estimated Creatinine Clearance: 38.6 mL/min (A) (by C-G formula based on SCr of 1.71 mg/dL (H)). Liver Function Tests:  Recent Labs  Lab 03/12/23 1340 03/13/23 0234 03/14/23 0240 03/15/23 0227 03/16/23 0204  AST 134* 127* 61* 52* 36  ALT 273* 242* 173* 117* 90*  ALKPHOS 219* 336* 287* 333* 297*  BILITOT 0.7 0.7 0.8 0.4 0.4  PROT 5.2* 5.6* 5.8* 5.5* 5.4*  ALBUMIN 2.4* 2.5* 2.6* 2.3* 2.3*   No results for input(s): "LIPASE", "AMYLASE" in the last 168 hours. No results for input(s): "AMMONIA" in the last 168 hours. Coagulation Profile:  Recent Labs  Lab 03/10/23 1611  INR 1.0   Recent Results (from the past 240 hours)  Resp panel by RT-PCR (RSV, Flu A&B, Covid) Anterior Nasal Swab     Status: None   Collection Time: 03/09/23  5:21 PM   Specimen: Anterior Nasal Swab  Result Value Ref Range Status   SARS Coronavirus 2 by RT PCR NEGATIVE NEGATIVE Final   Influenza A by PCR NEGATIVE NEGATIVE Final   Influenza B by PCR NEGATIVE NEGATIVE Final    Comment: (  NOTE) The Xpert Xpress SARS-CoV-2/FLU/RSV plus assay is intended as an aid in the diagnosis of influenza from Nasopharyngeal swab specimens and should not be used as a sole basis  for treatment. Nasal washings and aspirates are unacceptable for Xpert Xpress SARS-CoV-2/FLU/RSV testing.  Fact Sheet for Patients: BloggerCourse.com  Fact Sheet for Healthcare Providers: SeriousBroker.it  This test is not yet approved or cleared by the [place] FDA and has been authorized for detection and/or diagnosis of SARS-CoV-2 by FDA under an Emergency Use Authorization (EUA). This EUA will remain in effect (meaning this test can be used) for the duration of the COVID-19 declaration under Section 564(b)(1) of the Act, 21 U.S.C. section 360bbb-3(b)(1), unless the authorization is terminated or revoked.     Resp Syncytial Virus by PCR NEGATIVE NEGATIVE Final    Comment: (NOTE) Fact Sheet for Patients: BloggerCourse.com  Fact Sheet for Healthcare Providers: SeriousBroker.it  This test is not yet approved or cleared by the [place] FDA and has been authorized for detection and/or diagnosis of SARS-CoV-2 by FDA under an Emergency Use Authorization (EUA). This EUA will remain in effect (meaning this test can be used) for the duration of the COVID-19 declaration under Section 564(b)(1) of the Act, 21 U.S.C. section 360bbb-3(b)(1), unless the authorization is terminated or revoked.  Performed at [facility], [address] [place], [place] [address]   Blood culture (routine x 2)     Status: None   Collection Time: 03/09/23  8:12 PM   Specimen: BLOOD RIGHT ARM  Result Value Ref Range Status   Specimen Description BLOOD RIGHT ARM  Final   Special Requests   Final    BOTTLES DRAWN AEROBIC AND ANAEROBIC Blood Culture adequate volume   Culture   Final    NO GROWTH 5 DAYS Performed at [facility], [address], [place], [place] [address]    Report Status 03/14/2023 FINAL  Final  Blood culture (routine x 2)     Status: None   Collection Time: 03/09/23   8:12 PM   Specimen: BLOOD LEFT ARM  Result Value Ref Range Status   Specimen Description BLOOD LEFT ARM  Final   Special Requests   Final    BOTTLES DRAWN AEROBIC AND ANAEROBIC Blood Culture adequate volume   Culture   Final    NO GROWTH 5 DAYS Performed at [facility], [address], [place], [place] [address]    Report Status 03/14/2023 FINAL  Final    Antimicrobials/Microbiology: Anti-infectives (From admission, onward)    None         Component Value Date/Time   SDES BLOOD RIGHT ARM 03/09/2023 2012   SDES BLOOD LEFT ARM 03/09/2023 2012   SPECREQUEST  03/09/2023 2012    BOTTLES DRAWN AEROBIC AND ANAEROBIC Blood Culture adequate volume   SPECREQUEST  03/09/2023 2012    BOTTLES DRAWN AEROBIC AND ANAEROBIC Blood Culture adequate volume   CULT  03/09/2023 2012    NO GROWTH 5 DAYS Performed at [facility], [address], [place], [place] [address]    CULT  03/09/2023 2012    NO GROWTH 5 DAYS Performed at [facility], [address] [place], [place] [address]    REPTSTATUS 03/14/2023 FINAL 03/09/2023 2012   REPTSTATUS 03/14/2023 FINAL 03/09/2023 2012   Radiology Studies: No results found.  LOS: 6 days   Total time spent in review of labs and imaging, patient evaluation, formulation of plan, documentation and  communication with family: 35 minutes  Lanae Boast, MD [facility]  03/16/2023, 2:26 PM

## 2023-03-16 NOTE — Progress Notes (Signed)
This nurse was informed that the pt was off the telemetry monitor. It was discovered that pt had removed the telemetry equipment. When asked about putting it back on, pt refused, stating "it won't stay on anyway". Notified Dr. Joneen Roach, hospitalist on-call, of pt refusal. Telemetry department was also notified.

## 2023-03-16 NOTE — Discharge Summary (Signed)
Physician Discharge Summary  Martin Thomas ZOX:[id number] DOB: Dec 31, 1955 DOA: 03/09/2023  PCP: Default, Provider, MD  Admit date: 03/09/2023 Discharge date: 03/17/2023 Recommendations for Outpatient Follow-up:  Follow up with PCP in 1 weeks-call for appointment Please obtain BMP/CBC in one week Follow-up with gastroenterology for MRCP vs Liver biopsy  Follow-up with cardiology to resume the Lasix and losartan Follow-up with PCP within one week to check renal function Follow-up with dermatology for skin biopsy for chronic skin lesion on back  Discharge Dispo: Home Discharge Condition: Stable Code Status:   Code Status: Full Code Diet recommendation:  Diet Order             Diet Carb Modified Fluid consistency: Thin; Room service appropriate? Yes  Diet effective now                    Brief/Interim Summary: 67 y.o. 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  Patient presented secondary to chest pain concerning for possible cardiac etiology.  D-dimer elevated and VQ scan obtained which was significant for a right upper lobe pulmonary embolism.  Hospitalization also complicated by elevated AST/ALT/bilirubin with negative imaging.  Cardiology and gastroenterology consulted. He chest pain was found to be due to Acute PE, initially on heparin transitioned to Eliquis.GI recommended MRCP to definitively exclude choledocholithiasis but due to AKI unable to be performed.,  Creatinine has been fluctuating, discussed with Dr. Tomasa Rand from GI, given that concern for choledocholithiasis is essentially  zero at this point and his renal function is not showing any quick recovery he is planning for outpatient potential MRCP vs liver biopsy.  His Zio patch got lost> cardiology will be replacing at the time of discharge. Patient feels okay and ready for discharge home today, I had discussed with patient  niece 12/29 regarding discharge for 12/30 PRN for different follow-ups for his ongoing care        Discharge Diagnoses:  Principal Problem:   Chest pain Active Problems:   Transaminitis   AKI (acute kidney injury) (HCC)   Heart failure with mildly reduced ejection fraction (HFmrEF) (HCC)   DM2 (diabetes mellitus, type 2) (HCC)   HTN (hypertension)   High cholesterol   CKD stage 3a, GFR 45-59 ml/min (HCC)   Pulmonary nodule 1 cm or greater in diameter   Elevated liver function tests   Pulmonary embolus (HCC)   Precordial chest pain   Elevated troponin   HFrEF (heart failure with reduced ejection fraction) (HCC)   Hyponatremia   Nausea and vomiting in adult   Acute RUL PE Elevated troponin due to demand ischemia: CAD/NSTEMI in may  status post PCI to RCA with DES: Presents with chest pain tachycardia elevated troponin and found to have acute PE-seen by cardiology, CKD precludes cardiac cath mild decline in EF at 35-40% from previous 45-50% noted. Chest pain resolved, continue with Brilinta, aspirin stopped as patient on DOAC. On completion of PE treatment start back on aspirin 81 as outpatient. Statin on hold due to transaminitis, if further chest pain consider PET stress CT as outpatient.  Cardiology signed off -he had Zio patch placed by cardiology but came off and got lost 12/27 night-it has been reapplied 12/30 AM   Acute renal failure: B/l creat ~ 1.3-1.6. was 2.65>on admission likely secondary to prerenal and hypovolemia, nausea vomiting.  CT imaging without evidence of hydronephrosis or ureteral obstruction.some improvement with IV fluids initially. ?cardiorenal  syndrome with this EF of 35-40%.  With gentle IVF creatinine is improved to 1.7, holding Lasix and losartan until reassessed by cardiology or PCP in a week time.  Avoid NSAID's Recent Labs    08/05/22 0700 03/09/23 1422 03/09/23 2311 03/10/23 0729 03/11/23 0240 03/12/23 1340 03/13/23 0233 03/13/23 0234  03/14/23 0240 03/15/23 0227 03/16/23 0204  BUN 18 73* 67* 60* 59* 42*  --  36* 30* 36* 36*  CREATININE 1.42* 2.65* 2.42* 2.25* 2.07* 1.68*  --  1.59* 1.77* 1.99* 1.71*  CO2 27 20* 20* 21* 25 25  --  25 22 23  19*  K 4.2 3.9 3.6 3.7 3.5 4.2 4.2 4.2 4.7 4.7 5.1   Transaminitis/liver dysfunction: Patient asymptomatic without abdominal pain [place] Ruq 12/22 showed-cholelithiasis. GI advised MRCP - but holding due to AKI. Autoimmune workup viral serologies unremarkable Hep IgAb +, IgM neg. LFTs improving nicely. discussed with Dr. Tomasa Rand from GI, given that concern for choledocholithiasis is essentially zero at this point and his renal function is not showing any quick recovery he is planning for outpatient potential MRCP versus liver biopsy and okay for discharge home-GI to follow-up as outpatient  HFrEF: Previous EF 45-50% Mildly reduced to 35-40%, seen by cardiology -patient will go on metoprolol, home Brilinta  asa, eliquis.  Lasix and losartan on hold due to his renal dysfunction follow-up with cardiology to address   SVT: Continue Toprol  RLL pulm nodule: Incidentally noted on CT 14 mm slightly decreased compared to prior, follow-up outpatient.  HLD: Statins on hold  HTN: Continue metoprolol, continue to hold losartan and Lasix due to his renal dysfunction and follow-up with PCP  Diabetes mellitus type II: Patient is on Jardiance and insuline glargine .  Uncontrolled hyperglycemia overall improving on SSI and current semglee 5 u, A1c  is 8.2%. Continue SSI and Semglee 5 units daily-continue the same, continue Premeal insulin at home monitor with q. [facility] S Recent Labs  Lab 03/16/23 1154 03/16/23 1533 03/16/23 2141 03/17/23 0845 03/17/23 1145  GLUCAP 198* 183* 136* 181* 203*   Ambulatory dysfunction Left AKA: WBC bound since 2019.  ?Cognitive impairment: Niece is concerned.   Longstanding skin lesion on back: Having this lesion for long time as per niece- cont dressing. I  have advised fu with derm/ pcp for biopsy to rule out malignancy niece is aware.   Consults: Gastroenterology Cardiology  Subjective: Alert awake pleasant resting comfortably, eager to go home today  Discharge Exam: Vitals:   03/17/23 1040 03/17/23 1149  BP: (!) 106/58 (!) 116/43  Pulse: (!) 102 81  Resp: 20 18  Temp: 98.8 F (37.1 C) 98.8 F (37.1 C)  SpO2: 99% 98%   General: Pt is alert, awake, not in acute distress Cardiovascular: RRR, S1/S2 +, no rubs, no gallops Respiratory: CTA bilaterally, no wheezing, no rhonchi Abdominal: Soft, NT, ND, bowel sounds + Extremities: no edema, no cyanosis  Discharge Instructions  Discharge Instructions     Ambulatory referral to Cardiology   Complete by: As directed    Follow-up to address CHF medication   Ambulatory referral to Gastroenterology   Complete by: As directed    For follow-up MRCP versus liver biopsy   What is the reason for referral?: Other   Discharge instructions   Complete by: As directed    He will need to follow-up with the gastroenterology at [facility] to see if he needs repeat MRI or liver biopsy  Follow-up with primary care doctor to monitor renal function  Your  Lasix and losartan has been held due to renal dysfunction will need to be addressed by your primary care doctor or cardiology please follow-up with them   Please call call MD or return to ER for similar or worsening recurring problem that brought you to hospital or if any fever,nausea/vomiting,abdominal pain, uncontrolled pain, chest pain,  shortness of breath or any other alarming symptoms.  Please follow-up your doctor as instructed in a week time and call the office for appointment.  Please avoid alcohol, smoking, or any other illicit substance and maintain healthy habits including taking your regular medications as prescribed.  You were cared for by a hospitalist during your hospital stay. If you have any questions about your discharge medications  or the care you received while you were in the hospital after you are discharged, you can call the unit and ask to speak with the hospitalist on call if the hospitalist that took care of you is not available.  Once you are discharged, your primary care physician will handle any further medical issues. Please note that NO REFILLS for any discharge medications will be authorized once you are discharged, as it is imperative that you return to your primary care physician (or establish a relationship with a primary care physician if you do not have one) for your aftercare needs so that they can reassess your need for medications and monitor your lab values   Discharge wound care:   Complete by: As directed    Foam dressing to left back and right arm, change Q 3 days or PRN soiling   Increase activity slowly   Complete by: As directed       Allergies as of 03/17/2023       Reactions   Contrast Media [iodinated Contrast Media] Anaphylaxis   Iodine Anaphylaxis   Sulfamethoxazole-trimethoprim Nausea And Vomiting, Swelling   Other Reaction(s): Renal failure syndrome   Lisinopril Other (See Comments)   hyperkalemia Other Reaction(s): Hyperkalemia   Varenicline    Other Reaction(s): Altered mental status   Ciprofloxacin Other (See Comments)   unknown   Metformin Other (See Comments)   Increased lactic acid Other Reaction(s): Increased lactic acid level        Medication List     STOP taking these medications    aspirin EC 81 MG tablet   empagliflozin 25 MG Tabs tablet Commonly known as: JARDIANCE   furosemide 40 MG tablet Commonly known as: Lasix   losartan 25 MG tablet Commonly known as: COZAAR   nicotine polacrilex 4 MG gum Commonly known as: NICORETTE   nicotine polacrilex 4 MG lozenge Commonly known as: COMMIT       TAKE these medications    albuterol 108 (90 Base) MCG/ACT inhaler Commonly known as: VENTOLIN HFA Inhale 2 puffs into the lungs every 6 (six) hours as  needed for shortness of breath.   apixaban 5 MG Tabs tablet Commonly known as: ELIQUIS Take 2 tablets (10 mg total) by mouth 2 (two) times daily for 3 days, THEN 1 tablet (5 mg total) 2 (two) times daily for 27 days. Start taking on: March 17, 2023   atorvastatin 80 MG tablet Commonly known as: LIPITOR Take 1 tablet (80 mg total) by mouth daily. What changed:  how much to take when to take this   Blood Glucose Monitor System w/Device Kit Use in the morning, at noon, and at bedtime.   BLOOD GLUCOSE TEST STRIPS Strp Use in the morning, at noon, and at bedtime.  budesonide-formoterol 160-4.5 MCG/ACT inhaler Commonly known as: SYMBICORT Inhale 2 puffs into the lungs 2 (two) times daily.   colchicine 0.6 MG tablet Take 1 tablet (0.6 mg total) by mouth 2 (two) times daily.   cyclobenzaprine 10 MG tablet Commonly known as: FLEXERIL Take 10 mg by mouth at bedtime.   EVOLVE-MI evolocumab 140 mg/1 mL SQ injection Inject 1 mL (140 mg total) into the skin every 14 (fourteen) days. For Investigational Use Only. Inject subcutaneously into abdomen, thigh, or upper arm every 14 days. Rotate injection sites and do not inject into areas where skin is tender, bruised, or red. Please contact [facility] for any questions or concerns regarding this medication.   gabapentin 300 MG capsule Commonly known as: NEURONTIN Take 300 mg by mouth 2 (two) times daily.   HYDROcodone-acetaminophen 5-325 MG tablet Commonly known as: NORCO/VICODIN Take 1 tablet by mouth every 8 (eight) hours as needed. For pain   insulin aspart 100 UNIT/ML FlexPen Commonly known as: NOVOLOG Inject 12 Units into the skin 3 (three) times daily with meals.   insulin glargine-yfgn 100 UNIT/ML Pen Commonly known as: SEMGLEE Inject 5 Units into the skin at bedtime. Reassess to go up on home dose which is 32 u slowly with PCP What changed:  how much to take additional instructions   Insulin Pen Needle 32G X 4 MM  Misc Use 4 (four) times daily -  before meals and at bedtime.   magnesium oxide 400 (240 Mg) MG tablet Commonly known as: MAG-OX Take 400 mg by mouth daily.   metoprolol succinate 50 MG 24 hr tablet Commonly known as: TOPROL-XL Take 1 tablet (50 mg total) by mouth daily. Take with or immediately following a meal. What changed: Another medication with the same name was added. Make sure you understand how and when to take each.   metoprolol succinate 25 MG 24 hr tablet Commonly known as: TOPROL-XL Take 1 tablet (25 mg total) by mouth at bedtime. What changed: You were already taking a medication with the same name, and this prescription was added. Make sure you understand how and when to take each.   mirtazapine 30 MG tablet Commonly known as: REMERON Take 30 mg by mouth at bedtime.   nitroGLYCERIN 0.4 MG SL tablet Commonly known as: NITROSTAT Place 1 tablet (0.4 mg total) under the tongue every 5 (five) minutes as needed for chest pain.   omeprazole 40 MG capsule Commonly known as: PRILOSEC Take 40 mg by mouth daily.   ticagrelor 90 MG Tabs tablet Commonly known as: BRILINTA Take 1 tablet (90 mg total) by mouth 2 (two) times daily.               Discharge Care Instructions  (From admission, onward)           Start     Ordered   03/17/23 0000  Discharge wound care:       Comments: Foam dressing to left back and right arm, change Q 3 days or PRN soiling   03/17/23 1125            Follow-up Information     Sharlene Dory, PA-C Follow up on 04/03/2023.   Specialty: Cardiology Why: 1:55PM. Cardiology post hospital follow up Contact information: [address] [place] [address] [phone]         [facility] Follow up.   Specialty: Cardiology Why: office staff will arrange a 2 week heart monitor  Contact information: [address] [place] [address] [phone]         Jenel Lucks, MD Follow up in 1 week(s).   Specialty: Gastroenterology Contact information: [address] [place] [address] [phone]                Allergies  Allergen Reactions   Contrast Media [Iodinated Contrast Media] Anaphylaxis   Iodine Anaphylaxis   Sulfamethoxazole-Trimethoprim Nausea And Vomiting and Swelling    Other Reaction(s): Renal failure syndrome   Lisinopril Other (See Comments)    hyperkalemia  Other Reaction(s): Hyperkalemia   Varenicline     Other Reaction(s): Altered mental status   Ciprofloxacin Other (See Comments)    unknown   Metformin Other (See Comments)    Increased lactic acid  Other Reaction(s): Increased lactic acid level    The results of significant diagnostics from this hospitalization (including imaging, microbiology, ancillary and laboratory) are listed below for reference.    Microbiology: Recent Results (from the past 240 hours)  Resp panel by RT-PCR (RSV, Flu A&B, Covid) Anterior Nasal Swab     Status: None   Collection Time: 03/09/23  5:21 PM   Specimen: Anterior Nasal Swab  Result Value Ref Range Status   SARS Coronavirus 2 by RT PCR NEGATIVE NEGATIVE Final   Influenza A by PCR NEGATIVE NEGATIVE Final   Influenza B by PCR NEGATIVE NEGATIVE Final    Comment: (NOTE) The Xpert Xpress SARS-CoV-2/FLU/RSV plus assay is intended as an aid in the diagnosis of influenza from Nasopharyngeal swab specimens and should not be used as a sole basis for treatment. Nasal washings and aspirates are unacceptable for Xpert Xpress SARS-CoV-2/FLU/RSV testing.  Fact Sheet for Patients: BloggerCourse.com  Fact Sheet for Healthcare Providers: SeriousBroker.it  This test is not yet approved or cleared by the [place] FDA and has been authorized for detection and/or diagnosis of SARS-CoV-2 by FDA under an Emergency Use Authorization (EUA). This EUA will remain in effect  (meaning this test can be used) for the duration of the COVID-19 declaration under Section 564(b)(1) of the Act, 21 U.S.C. section 360bbb-3(b)(1), unless the authorization is terminated or revoked.     Resp Syncytial Virus by PCR NEGATIVE NEGATIVE Final    Comment: (NOTE) Fact Sheet for Patients: BloggerCourse.com  Fact Sheet for Healthcare Providers: SeriousBroker.it  This test is not yet approved or cleared by the [place] FDA and has been authorized for detection and/or diagnosis of SARS-CoV-2 by FDA under an Emergency Use Authorization (EUA). This EUA will remain in effect (meaning this test can be used) for the duration of the COVID-19 declaration under Section 564(b)(1) of the Act, 21 U.S.C. section 360bbb-3(b)(1), unless the authorization is terminated or revoked.  Performed at [facility], [address], [place], [place] [address]   Blood culture (routine x 2)     Status: None   Collection Time: 03/09/23  8:12 PM   Specimen: BLOOD RIGHT ARM  Result Value Ref Range Status   Specimen Description BLOOD RIGHT ARM  Final   Special Requests   Final    BOTTLES DRAWN AEROBIC AND ANAEROBIC Blood Culture adequate volume   Culture   Final    NO GROWTH 5 DAYS Performed at [facility], [address], [place], [place] [address]    Report Status 03/14/2023 FINAL  Final  Blood culture (routine x 2)     Status: None   Collection Time: 03/09/23  8:12 PM   Specimen: BLOOD LEFT ARM  Result Value Ref Range Status   Specimen Description BLOOD LEFT ARM  Final   Special Requests   Final    BOTTLES DRAWN AEROBIC AND ANAEROBIC Blood Culture adequate volume   Culture   Final    NO GROWTH 5 DAYS Performed at [facility], [address] [place], [place] [address]    Report Status 03/14/2023 FINAL  Final    Procedures/Studies: ECHOCARDIOGRAM COMPLETE Result Date: 03/10/2023    ECHOCARDIOGRAM REPORT   Patient  Name:   Martin Thomas Date of Exam: 03/10/2023 Medical Rec #:  [id number]        Height:       69.0 in Accession #:    [id number]       Weight:       186.0 lb Date of Birth:  [birth year]         BSA:          2.003 m Patient Age:    67 years         BP:           128//67 mmHg Patient Gender: M                HR:           124 bpm. Exam Location:  Inpatient Procedure: 2D Echo, Cardiac Doppler and Color Doppler Indications:     Chest Pain R07.9  History:         Patient has prior history of Echocardiogram examinations, most                  recent 08/05/2022. COPD; Risk Factors:Hypertension and Diabetes.  Sonographer:     Darlys Gales Referring Phys:  [id number] JARED M GARDNER Diagnosing Phys: Clearnce Hasten IMPRESSIONS  1. Left ventricular ejection fraction, by estimation, is 35 to 40%. The left ventricle has moderately decreased function. The left ventricle demonstrates regional wall motion abnormalities (see scoring diagram/findings for description). Indeterminate diastolic filling due to E-A fusion. Hypokineis of the inferior and inferoseptal walls.  2. Right ventricular systolic function is mildly reduced. The right ventricular size is normal.  3. The mitral valve is normal in structure. No evidence of mitral valve regurgitation. No evidence of mitral stenosis.  4. The aortic valve is normal in structure. There is moderate calcification of the aortic valve. Aortic valve regurgitation is mild to moderate. Aortic valve sclerosis/calcification is present, without any evidence of aortic stenosis.  5. The inferior vena cava is normal in size with greater than 50% respiratory variability, suggesting right atrial pressure of 3 mmHg. FINDINGS  Left Ventricle: Left ventricular ejection fraction, by estimation, is 35 to 40%. The left ventricle has moderately decreased function. The left ventricle demonstrates regional wall motion abnormalities. The left ventricular internal cavity size was normal in size. There is no left  ventricular hypertrophy. Indeterminate diastolic filling due to E-A fusion.  LV Wall Scoring: Hypokineis of the inferior and inferoseptal walls. Right Ventricle: The right ventricular size is normal. No increase in right ventricular wall thickness. Right ventricular systolic function is mildly reduced. Left Atrium: Left atrial size was normal in size. Right Atrium: Right atrial size was normal in size. Pericardium: There is no evidence of pericardial effusion. Mitral Valve: The mitral valve is normal in structure. No evidence of mitral valve regurgitation. No evidence of mitral valve stenosis. Tricuspid Valve: The tricuspid valve is normal in structure. Tricuspid valve regurgitation is not demonstrated. No  evidence of tricuspid stenosis. Aortic Valve: The aortic valve is normal in structure. There is moderate calcification of the aortic valve. Aortic valve regurgitation is mild to moderate. Aortic regurgitation PHT measures 323 msec. Aortic valve sclerosis/calcification is present, without any evidence of aortic stenosis. Aortic valve peak gradient measures 15.7 mmHg. Pulmonic Valve: The pulmonic valve was normal in structure. Pulmonic valve regurgitation is not visualized. No evidence of pulmonic stenosis. Aorta: The aortic root is normal in size and structure. Venous: The inferior vena cava is normal in size with greater than 50% respiratory variability, suggesting right atrial pressure of 3 mmHg. IAS/Shunts: No atrial level shunt detected by color flow Doppler.  LEFT VENTRICLE PLAX 2D LVIDd:         4.60 cm LVIDs:         3.70 cm LV PW:         0.90 cm LV IVS:        0.90 cm  RIGHT VENTRICLE RV S prime:     10.10 cm/s TAPSE (M-mode): 1.2 cm LEFT ATRIUM             Index       RIGHT ATRIUM          Index LA Vol (A2C):   16.2 ml 8.09 ml/m  RA Area:     7.56 cm LA Vol (A4C):   19.8 ml 9.88 ml/m  RA Volume:   12.20 ml 6.09 ml/m LA Biplane Vol: 19.0 ml 9.48 ml/m  AORTIC VALVE AV Vmax:      198.00 cm/s AV Peak  Grad: 15.7 mmHg LVOT Vmax:    112.00 cm/s AI PHT:       323 msec  AORTA Ao Root diam: 2.70 cm MITRAL VALVE MV Area (PHT): 5.13 cm MV Decel Time: 148 msec MV E velocity: 126.00 cm/s Clearnce Hasten Electronically signed by Clearnce Hasten Signature Date/Time: 03/10/2023/1:13:42 PM    Final (Updated)    NM Pulmonary Perfusion Result Date: 03/10/2023 CLINICAL DATA:  Nonspecific chest pain, positive D-dimer EXAM: NUCLEAR MEDICINE PERFUSION LUNG SCAN TECHNIQUE: Perfusion images were obtained in multiple projections after intravenous injection of radiopharmaceutical. Ventilation scans intentionally deferred if perfusion scan and chest x-ray adequate for interpretation during COVID 19 epidemic. RADIOPHARMACEUTICALS:  4.0 mCi Tc-22m MAA IV COMPARISON:  03/09/2023, 11/28/2022 FINDINGS: Planar images of the lungs are obtained in multiple projections during the perfusion exam. There is a perfusion defect involving the apical segment of the right upper lobe, best seen on the anterior and posterior projections. No corresponding abnormality on chest x-ray. Otherwise normal perfusion throughout the remainder of the lung parenchyma. IMPRESSION: 1. Segmental perfusion defect apical segment right upper lobe without corresponding chest x-ray finding, consistent with pulmonary embolus. Critical Value/emergent results were called by telephone at the time of interpretation on 03/10/2023 at 1201 pm to provider DR Durwin Nora, who verbally acknowledged these results. Electronically Signed   By: Sharlet Salina M.D.   On: 03/10/2023 12:11   US Abdomen Limited RUQ (LIVER/GB) Result Date: 03/09/2023 CLINICAL DATA:  Transaminitis EXAM: ULTRASOUND ABDOMEN LIMITED RIGHT UPPER QUADRANT COMPARISON:  CT today FINDINGS: Gallbladder: Small layering stones. No wall thickening or sonographic Murphy sign. Common bile duct: Diameter: Normal caliber, 5-6 mm. Liver: No focal lesion identified. Within normal limits in parenchymal echogenicity. Portal vein  is patent on color Doppler imaging with normal direction of blood flow towards the liver. Other: None. IMPRESSION: Cholelithiasis.  No sonographic evidence of acute cholecystitis. Electronically Signed   By: Charlett Nose  M.D.   On: 03/09/2023 22:22   CT ABDOMEN PELVIS WO CONTRAST Result Date: 03/09/2023 CLINICAL DATA:  Abdominal pain, vomiting, elevated LFTs. EXAM: CT ABDOMEN AND PELVIS WITHOUT CONTRAST TECHNIQUE: Multidetector CT imaging of the abdomen and pelvis was performed following the standard protocol without IV contrast. RADIATION DOSE REDUCTION: This exam was performed according to the departmental dose-optimization program which includes automated exposure control, adjustment of the mA and/or kV according to patient size and/or use of iterative reconstruction technique. COMPARISON:  None Available. FINDINGS: Lower chest: Calcifications crash that extensive calcifications throughout the visualized right and left circumflex coronary arteries. Right lower lobe pulmonary nodule measures 1.4 cm on image 26. this compares to 1.7 cm on prior PET CT from 11/28/2022. No effusions. Hepatobiliary: Small layering gallstones within the gallbladder. No focal hepatic abnormality or biliary ductal dilatation. Pancreas: No focal abnormality or ductal dilatation. Spleen: No focal abnormality.  Normal size. Adrenals/Urinary Tract: No adrenal abnormality. No focal renal abnormality. No stones or hydronephrosis. Urinary bladder is unremarkable. Stomach/Bowel: Sigmoid diverticulosis. No active diverticulitis. Stomach and small bowel decompressed, unremarkable. Vascular/Lymphatic: Diffuse aortoiliac atherosclerosis. No evidence of aneurysm or adenopathy. Reproductive: No visible focal abnormality. Other: No free fluid or free air. Musculoskeletal: No acute bony abnormality. IMPRESSION: 1.4 cm right lower lobe pulmonary nodule, stable or slightly decreased in size since prior PET CT. Small layering gallstones. No evidence of  acute cholecystitis or biliary ductal dilatation. Coronary artery disease, aortoiliac atherosclerosis. Sigmoid diverticulosis. No acute findings. Electronically Signed   By: Charlett Nose M.D.   On: 03/09/2023 20:59   DG Chest Port 1 View Result Date: 03/09/2023 CLINICAL DATA:  Pain.  Chest pain and shortness of breath. EXAM: PORTABLE CHEST 1 VIEW COMPARISON:  08/04/2022 FINDINGS: Normal heart size and pulmonary vascularity. No focal airspace disease or consolidation in the lungs. No blunting of costophrenic angles. No pneumothorax. Mediastinal contours appear intact. Calcification of the aorta. IMPRESSION: No active disease. Electronically Signed   By: Burman Nieves M.D.   On: 03/09/2023 18:52    Labs: BNP (last 3 results) Recent Labs    08/04/22 1750  BNP 401.6*   Basic Metabolic Panel: Recent Labs  Lab 03/12/23 1340 03/13/23 0233 03/13/23 0234 03/14/23 0240 03/15/23 0227 03/16/23 0204  NA 136  --  135 134* 135 130*  K 4.2 4.2 4.2 4.7 4.7 5.1  CL 97*  --  101 104 103 104  CO2 25  --  25 22 23  19*  GLUCOSE 195*  --  187* 122* 186* 183*  BUN 42*  --  36* 30* 36* 36*  CREATININE 1.68*  --  1.59* 1.77* 1.99* 1.71*  CALCIUM 8.2*  --  8.1* 8.4* 8.5* 8.5*  MG  --  1.6*  --   --   --   --    Liver Function Tests: Recent Labs  Lab 03/12/23 1340 03/13/23 0234 03/14/23 0240 03/15/23 0227 03/16/23 0204  AST 134* 127* 61* 52* 36  ALT 273* 242* 173* 117* 90*  ALKPHOS 219* 336* 287* 333* 297*  BILITOT 0.7 0.7 0.8 0.4 0.4  PROT 5.2* 5.6* 5.8* 5.5* 5.4*  ALBUMIN 2.4* 2.5* 2.6* 2.3* 2.3*   No results for input(s): "LIPASE", "AMYLASE" in the last 168 hours. No results for input(s): "AMMONIA" in the last 168 hours. CBC: Recent Labs  Lab 03/13/23 0233 03/14/23 0240 03/15/23 0227 03/16/23 0204 03/17/23 0236  WBC 8.4 8.8 9.7 8.8 10.1  HGB 11.4* 11.6* 10.3* 9.6* 9.5*  HCT 33.6* 34.5* 30.7* 28.3*  27.9*  MCV 95.7 96.1 96.2 96.3 95.2  PLT 309 347 339 348 349   Cardiac  Enzymes: No results for input(s): "CKTOTAL", "CKMB", "CKMBINDEX", "TROPONINI" in the last 168 hours.  BNP: Invalid input(s): "POCBNP" CBG: Recent Labs  Lab 03/16/23 1154 03/16/23 1533 03/16/23 2141 03/17/23 0845 03/17/23 1145  GLUCAP 198* 183* 136* 181* 203*   D-Dimer No results for input(s): "DDIMER" in the last 72 hours. Hgb A1c No results for input(s): "HGBA1C" in the last 72 hours. Lipid Profile No results for input(s): "CHOL", "HDL", "LDLCALC", "TRIG", "CHOLHDL", "LDLDIRECT" in the last 72 hours. Thyroid function studies No results for input(s): "TSH", "T4TOTAL", "T3FREE", "THYROIDAB" in the last 72 hours.  Invalid input(s): "FREET3" Anemia work up No results for input(s): "VITAMINB12", "FOLATE", "FERRITIN", "TIBC", "IRON", "RETICCTPCT" in the last 72 hours. Urinalysis    Component Value Date/Time   COLORURINE YELLOW 03/09/2023 2012   APPEARANCEUR CLEAR 03/09/2023 2012   LABSPEC 1.023 03/09/2023 2012   PHURINE 5.0 03/09/2023 2012   GLUCOSEU NEGATIVE 03/09/2023 2012   HGBUR NEGATIVE 03/09/2023 2012   BILIRUBINUR NEGATIVE 03/09/2023 2012   KETONESUR NEGATIVE 03/09/2023 2012   PROTEINUR >=300 (A) 03/09/2023 2012   NITRITE NEGATIVE 03/09/2023 2012   LEUKOCYTESUR NEGATIVE 03/09/2023 2012   Sepsis Labs Recent Labs  Lab 03/14/23 0240 03/15/23 0227 03/16/23 0204 03/17/23 0236  WBC 8.8 9.7 8.8 10.1   Microbiology Recent Results (from the past 240 hours)  Resp panel by RT-PCR (RSV, Flu A&B, Covid) Anterior Nasal Swab     Status: None   Collection Time: 03/09/23  5:21 PM   Specimen: Anterior Nasal Swab  Result Value Ref Range Status   SARS Coronavirus 2 by RT PCR NEGATIVE NEGATIVE Final   Influenza A by PCR NEGATIVE NEGATIVE Final   Influenza B by PCR NEGATIVE NEGATIVE Final    Comment: (NOTE) The Xpert Xpress SARS-CoV-2/FLU/RSV plus assay is intended as an aid in the diagnosis of influenza from Nasopharyngeal swab specimens and should not be used as a sole  basis for treatment. Nasal washings and aspirates are unacceptable for Xpert Xpress SARS-CoV-2/FLU/RSV testing.  Fact Sheet for Patients: BloggerCourse.com  Fact Sheet for Healthcare Providers: SeriousBroker.it  This test is not yet approved or cleared by the [place] FDA and has been authorized for detection and/or diagnosis of SARS-CoV-2 by FDA under an Emergency Use Authorization (EUA). This EUA will remain in effect (meaning this test can be used) for the duration of the COVID-19 declaration under Section 564(b)(1) of the Act, 21 U.S.C. section 360bbb-3(b)(1), unless the authorization is terminated or revoked.     Resp Syncytial Virus by PCR NEGATIVE NEGATIVE Final    Comment: (NOTE) Fact Sheet for Patients: BloggerCourse.com  Fact Sheet for Healthcare Providers: SeriousBroker.it  This test is not yet approved or cleared by the [place] FDA and has been authorized for detection and/or diagnosis of SARS-CoV-2 by FDA under an Emergency Use Authorization (EUA). This EUA will remain in effect (meaning this test can be used) for the duration of the COVID-19 declaration under Section 564(b)(1) of the Act, 21 U.S.C. section 360bbb-3(b)(1), unless the authorization is terminated or revoked.  Performed at [facility], [address], [place], [place] [address]   Blood culture (routine x 2)     Status: None   Collection Time: 03/09/23  8:12 PM   Specimen: BLOOD RIGHT ARM  Result Value Ref Range Status   Specimen Description BLOOD RIGHT ARM  Final   Special Requests  Final    BOTTLES DRAWN AEROBIC AND ANAEROBIC Blood Culture adequate volume   Culture   Final    NO GROWTH 5 DAYS Performed at [facility], [address], [place], [place] [address]    Report Status 03/14/2023 FINAL  Final  Blood culture (routine x 2)     Status: None   Collection Time:  03/09/23  8:12 PM   Specimen: BLOOD LEFT ARM  Result Value Ref Range Status   Specimen Description BLOOD LEFT ARM  Final   Special Requests   Final    BOTTLES DRAWN AEROBIC AND ANAEROBIC Blood Culture adequate volume   Culture   Final    NO GROWTH 5 DAYS Performed at [facility], [address], [place], [place] [address]    Report Status 03/14/2023 FINAL  Final    Time coordinating discharge: 35 minutes  SIGNED: Lanae Boast, MD  [facility] 03/17/2023, 11:56 AM  If 7PM-7AM, please contact night-coverage [email or web address]

## 2023-03-16 NOTE — Plan of Care (Signed)
°  Problem: Metabolic: Goal: Ability to maintain appropriate glucose levels will improve Outcome: Progressing   Problem: Nutritional: Goal: Maintenance of adequate nutrition will improve Outcome: Progressing   Problem: Skin Integrity: Goal: Risk for impaired skin integrity will decrease Outcome: Progressing   Problem: Activity: Goal: Ability to tolerate increased activity will improve Outcome: Progressing   Problem: Clinical Measurements: Goal: Will remain free from infection Outcome: Progressing   Problem: Nutrition: Goal: Adequate nutrition will be maintained Outcome: Progressing   Problem: Pain Management: Goal: General experience of comfort will improve Outcome: Progressing   Problem: Safety: Goal: Ability to remain free from injury will improve Outcome: Progressing

## 2023-03-16 NOTE — Progress Notes (Signed)
Hospitalist service requested ZIO monitor to be placed tomorrow morning prior to discharge, staff message sent to cardmaster to arrange tomorrow.

## 2023-03-17 ENCOUNTER — Inpatient Hospital Stay (HOSPITAL_COMMUNITY)
Admit: 2023-03-17 | Discharge: 2023-03-17 | Disposition: A | Discharge status: Home / Self Care | Payer: Medicare PPO | Attending: Cardiology | Admitting: Cardiology

## 2023-03-17 ENCOUNTER — Telehealth: Payer: Self-pay

## 2023-03-17 ENCOUNTER — Encounter: Payer: Self-pay | Admitting: *Deleted

## 2023-03-17 ENCOUNTER — Other Ambulatory Visit: Payer: Self-pay | Admitting: Home Health

## 2023-03-17 ENCOUNTER — Other Ambulatory Visit (HOSPITAL_COMMUNITY): Payer: Self-pay

## 2023-03-17 DIAGNOSIS — I479 Paroxysmal tachycardia, unspecified: Secondary | ICD-10-CM

## 2023-03-17 DIAGNOSIS — Z006 Encounter for examination for normal comparison and control in clinical research program: Secondary | ICD-10-CM

## 2023-03-17 DIAGNOSIS — R079 Chest pain, unspecified: Secondary | ICD-10-CM | POA: Diagnosis not present

## 2023-03-17 LAB — CBC
HCT: 27.9 % — ABNORMAL LOW (ref 39.0–52.0)
Hemoglobin: 9.5 g/dL — ABNORMAL LOW (ref 13.0–17.0)
MCH: 32.4 pg (ref 26.0–34.0)
MCHC: 34.1 g/dL (ref 30.0–36.0)
MCV: 95.2 fL (ref 80.0–100.0)
Platelets: 349 10*3/uL (ref 150–400)
RBC: 2.93 MIL/uL — ABNORMAL LOW (ref 4.22–5.81)
RDW: 14.8 % (ref 11.5–15.5)
WBC: 10.1 10*3/uL (ref 4.0–10.5)
nRBC: 0 % (ref 0.0–0.2)

## 2023-03-17 LAB — GLUCOSE, CAPILLARY
Glucose-Capillary: 181 mg/dL — ABNORMAL HIGH (ref 70–99)
Glucose-Capillary: 203 mg/dL — ABNORMAL HIGH (ref 70–99)

## 2023-03-17 MED ORDER — INSULIN GLARGINE-YFGN 100 UNIT/ML ~~LOC~~ SOPN: 5.0000 [IU] | PEN_INJECTOR | Freq: Every day | SUBCUTANEOUS | Status: DC

## 2023-03-17 MED ORDER — APIXABAN 5 MG PO TABS
ORAL_TABLET | ORAL | 0 refills | Status: DC
  Filled 2023-03-17: qty 66, 30d supply, fill #0

## 2023-03-17 MED ORDER — METOPROLOL SUCCINATE ER 25 MG PO TB24
25.0000 mg | ORAL_TABLET | Freq: Every day | ORAL | 0 refills | Status: AC
  Filled 2023-03-17: qty 30, 30d supply, fill #0

## 2023-03-17 NOTE — Telephone Encounter (Signed)
-----   Message from Jenel Lucks sent at 03/15/2023  3:56 PM EST ----- Regarding: Outpatient follow up Martin Thomas, Can you please book a routine outpatient office visit for hospital follow-up of elevated liver enzymes?

## 2023-03-17 NOTE — TOC Transition Note (Addendum)
Transition of Care [facility]) - Discharge Note   Patient Details  Name: Martin Thomas MRN: [id number] Date of Birth: [birth year]  Transition [facility]) CM/SW Contact:  Leone Haven, RN Phone Number: 03/17/2023, 11:31 AM   Clinical Narrative:    For dc today, his brother will transport him home. NCM will fax dc summary to PCP.          Patient Goals and CMS Choice            Discharge Placement                       Discharge Plan and Services Additional resources added to the After Visit Summary for                                       Social Drivers of Health (SDOH) Interventions SDOH Screenings   Food Insecurity: No Food Insecurity (03/10/2023)  Housing: Low Risk  (03/10/2023)  Transportation Needs: No Transportation Needs (03/10/2023)  Utilities: Not At Risk (03/10/2023)  Social Connections: Feeling Socially Integrated (04/12/2021)   Received [facility]  Tobacco Use: High Risk (03/10/2023)  Health Literacy: Adequate Health Literacy (04/12/2021)   Received from [facility], [facility]     Readmission Risk Interventions    07/23/2022    2:25 PM  Readmission Risk Prevention Plan  Transportation Screening Complete  PCP or Specialist Appt within 3-5 Days --  HRI or Home Care Consult Complete  Palliative Care Screening Not Applicable  Medication Review (RN Care Manager) Complete

## 2023-03-17 NOTE — Progress Notes (Addendum)
Live Zio ordered this morning, paged EKG department to apply before patient discharged today.   Dr Mayford Knife to follow on results. Follow up with cardiology already scheduled on 04/02/22, will keep.

## 2023-03-17 NOTE — Progress Notes (Signed)
Discharge instructions provided by Verl Dicker, AD. Patient discharged to home with all belongings at 1232 on 03/17/23 via wheelchair by transport team.

## 2023-03-17 NOTE — Plan of Care (Signed)
  Problem: Education: Goal: Ability to describe self-care measures that may prevent or decrease complications (Diabetes Survival Skills Education) will improve Outcome: Adequate for Discharge Goal: Individualized Educational Video(s) Outcome: Adequate for Discharge   Problem: Coping: Goal: Ability to adjust to condition or change in health will improve Outcome: Adequate for Discharge   Problem: Fluid Volume: Goal: Ability to maintain a balanced intake and output will improve Outcome: Adequate for Discharge   Problem: Health Behavior/Discharge Planning: Goal: Ability to identify and utilize available resources and services will improve Outcome: Adequate for Discharge Goal: Ability to manage health-related needs will improve Outcome: Adequate for Discharge   Problem: Metabolic: Goal: Ability to maintain appropriate glucose levels will improve Outcome: Adequate for Discharge   Problem: Nutritional: Goal: Maintenance of adequate nutrition will improve Outcome: Adequate for Discharge Goal: Progress toward achieving an optimal weight will improve Outcome: Adequate for Discharge   Problem: Skin Integrity: Goal: Risk for impaired skin integrity will decrease Outcome: Adequate for Discharge   Problem: Tissue Perfusion: Goal: Adequacy of tissue perfusion will improve Outcome: Adequate for Discharge   Problem: Education: Goal: Understanding of cardiac disease, CV risk reduction, and recovery process will improve Outcome: Adequate for Discharge Goal: Individualized Educational Video(s) Outcome: Adequate for Discharge   Problem: Activity: Goal: Ability to tolerate increased activity will improve Outcome: Adequate for Discharge   Problem: Cardiac: Goal: Ability to achieve and maintain adequate cardiovascular perfusion will improve Outcome: Adequate for Discharge   Problem: Health Behavior/Discharge Planning: Goal: Ability to safely manage health-related needs after discharge  will improve Outcome: Adequate for Discharge   Problem: Education: Goal: Knowledge of General Education information will improve Description: Including pain rating scale, medication(s)/side effects and non-pharmacologic comfort measures Outcome: Adequate for Discharge   Problem: Health Behavior/Discharge Planning: Goal: Ability to manage health-related needs will improve Outcome: Adequate for Discharge   Problem: Clinical Measurements: Goal: Ability to maintain clinical measurements within normal limits will improve Outcome: Adequate for Discharge Goal: Will remain free from infection Outcome: Adequate for Discharge Goal: Diagnostic test results will improve Outcome: Adequate for Discharge Goal: Respiratory complications will improve Outcome: Adequate for Discharge Goal: Cardiovascular complication will be avoided Outcome: Adequate for Discharge   Problem: Activity: Goal: Risk for activity intolerance will decrease Outcome: Adequate for Discharge   Problem: Nutrition: Goal: Adequate nutrition will be maintained Outcome: Adequate for Discharge   Problem: Coping: Goal: Level of anxiety will decrease Outcome: Adequate for Discharge   Problem: Elimination: Goal: Will not experience complications related to bowel motility Outcome: Adequate for Discharge Goal: Will not experience complications related to urinary retention Outcome: Adequate for Discharge   Problem: Pain Management: Goal: General experience of comfort will improve Outcome: Adequate for Discharge   Problem: Safety: Goal: Ability to remain free from injury will improve Outcome: Adequate for Discharge   Problem: Skin Integrity: Goal: Risk for impaired skin integrity will decrease Outcome: Adequate for Discharge

## 2023-03-17 NOTE — TOC Progression Note (Addendum)
Transition of Care [facility]) - Progression Note    Patient Details  Name: Martin Thomas MRN: [id number] Date of Birth: 01-21-1956  Transition [facility]) CM/SW Contact  Leone Haven, RN Phone Number: 03/17/2023, 11:06 AM  Clinical Narrative:    Patient goes to [facility] , Dr. Janice Norrie is PCP at [facility] fax is [phone], Frazier Richards is the CSW phone is [phone] ext [phone].  Patient is for a zio  patch today.  Patient states his brother will take him home today at dc.  NCM spoke with brother , he states patient just needs to call when he is ready.        Expected Discharge Plan and Services                                               Social Determinants of Health (SDOH) Interventions SDOH Screenings   Food Insecurity: No Food Insecurity (03/10/2023)  Housing: Low Risk  (03/10/2023)  Transportation Needs: No Transportation Needs (03/10/2023)  Utilities: Not At Risk (03/10/2023)  Social Connections: Feeling Socially Integrated (04/12/2021)   [facility], [facility]  Tobacco Use: High Risk (03/10/2023)  Health Literacy: Adequate Health Literacy (04/12/2021)   [facility], [facility]    Readmission Risk Interventions    07/23/2022    2:25 PM  Readmission Risk Prevention Plan  Transportation Screening Complete  PCP or Specialist Appt within 3-5 Days --  HRI or Home Care Consult Complete  Palliative Care Screening Not Applicable  Medication Review (RN Care Manager) Complete

## 2023-03-17 NOTE — Plan of Care (Signed)
Problem: Education: Goal: Ability to describe self-care measures that may prevent or decrease complications (Diabetes Survival Skills Education) will improve 03/17/2023 1230 by Reynold Bowen, RN Outcome: Adequate for Discharge 03/17/2023 1229 by Reynold Bowen, RN Outcome: Adequate for Discharge Goal: Individualized Educational Video(s) 03/17/2023 1230 by Reynold Bowen, RN Outcome: Adequate for Discharge 03/17/2023 1229 by Reynold Bowen, RN Outcome: Adequate for Discharge   Problem: Coping: Goal: Ability to adjust to condition or change in health will improve 03/17/2023 1230 by Reynold Bowen, RN Outcome: Adequate for Discharge 03/17/2023 1229 by Reynold Bowen, RN Outcome: Adequate for Discharge   Problem: Fluid Volume: Goal: Ability to maintain a balanced intake and output will improve 03/17/2023 1230 by Reynold Bowen, RN Outcome: Adequate for Discharge 03/17/2023 1229 by Reynold Bowen, RN Outcome: Adequate for Discharge   Problem: Health Behavior/Discharge Planning: Goal: Ability to identify and utilize available resources and services will improve 03/17/2023 1230 by Reynold Bowen, RN Outcome: Adequate for Discharge 03/17/2023 1229 by Reynold Bowen, RN Outcome: Adequate for Discharge Goal: Ability to manage health-related needs will improve 03/17/2023 1230 by Reynold Bowen, RN Outcome: Adequate for Discharge 03/17/2023 1229 by Reynold Bowen, RN Outcome: Adequate for Discharge   Problem: Metabolic: Goal: Ability to maintain appropriate glucose levels will improve 03/17/2023 1230 by Reynold Bowen, RN Outcome: Adequate for Discharge 03/17/2023 1229 by Reynold Bowen, RN Outcome: Adequate for Discharge   Problem: Nutritional: Goal: Maintenance of adequate nutrition will improve 03/17/2023 1230 by Reynold Bowen, RN Outcome: Adequate for  Discharge 03/17/2023 1229 by Reynold Bowen, RN Outcome: Adequate for Discharge Goal: Progress toward achieving an optimal weight will improve 03/17/2023 1230 by Reynold Bowen, RN Outcome: Adequate for Discharge 03/17/2023 1229 by Reynold Bowen, RN Outcome: Adequate for Discharge   Problem: Skin Integrity: Goal: Risk for impaired skin integrity will decrease 03/17/2023 1230 by Reynold Bowen, RN Outcome: Adequate for Discharge 03/17/2023 1229 by Reynold Bowen, RN Outcome: Adequate for Discharge   Problem: Tissue Perfusion: Goal: Adequacy of tissue perfusion will improve 03/17/2023 1230 by Reynold Bowen, RN Outcome: Adequate for Discharge 03/17/2023 1229 by Reynold Bowen, RN Outcome: Adequate for Discharge   Problem: Education: Goal: Understanding of cardiac disease, CV risk reduction, and recovery process will improve 03/17/2023 1230 by Reynold Bowen, RN Outcome: Adequate for Discharge 03/17/2023 1229 by Reynold Bowen, RN Outcome: Adequate for Discharge Goal: Individualized Educational Video(s) 03/17/2023 1230 by Reynold Bowen, RN Outcome: Adequate for Discharge 03/17/2023 1229 by Reynold Bowen, RN Outcome: Adequate for Discharge   Problem: Activity: Goal: Ability to tolerate increased activity will improve 03/17/2023 1230 by Reynold Bowen, RN Outcome: Adequate for Discharge 03/17/2023 1229 by Reynold Bowen, RN Outcome: Adequate for Discharge   Problem: Cardiac: Goal: Ability to achieve and maintain adequate cardiovascular perfusion will improve 03/17/2023 1230 by Reynold Bowen, RN Outcome: Adequate for Discharge 03/17/2023 1229 by Reynold Bowen, RN Outcome: Adequate for Discharge   Problem: Health Behavior/Discharge Planning: Goal: Ability to safely manage health-related needs after discharge will improve 03/17/2023 1230 by Reynold Bowen, RN Outcome: Adequate for Discharge 03/17/2023 1229 by Reynold Bowen, RN Outcome: Adequate for Discharge   Problem: Education: Goal: Knowledge of General Education information will improve Description: Including pain rating scale, medication(s)/side effects and non-pharmacologic comfort measures 03/17/2023 1230 by Reynold Bowen, RN Outcome: Adequate for Discharge 03/17/2023 1229 by Reynold Bowen, RN  Outcome: Adequate for Discharge   Problem: Health Behavior/Discharge Planning: Goal: Ability to manage health-related needs will improve 03/17/2023 1230 by Reynold Bowen, RN Outcome: Adequate for Discharge 03/17/2023 1229 by Reynold Bowen, RN Outcome: Adequate for Discharge   Problem: Clinical Measurements: Goal: Ability to maintain clinical measurements within normal limits will improve 03/17/2023 1230 by Reynold Bowen, RN Outcome: Adequate for Discharge 03/17/2023 1229 by Reynold Bowen, RN Outcome: Adequate for Discharge Goal: Will remain free from infection 03/17/2023 1230 by Reynold Bowen, RN Outcome: Adequate for Discharge 03/17/2023 1229 by Reynold Bowen, RN Outcome: Adequate for Discharge Goal: Diagnostic test results will improve 03/17/2023 1230 by Reynold Bowen, RN Outcome: Adequate for Discharge 03/17/2023 1229 by Reynold Bowen, RN Outcome: Adequate for Discharge Goal: Respiratory complications will improve 03/17/2023 1230 by Reynold Bowen, RN Outcome: Adequate for Discharge 03/17/2023 1229 by Reynold Bowen, RN Outcome: Adequate for Discharge Goal: Cardiovascular complication will be avoided 03/17/2023 1230 by Reynold Bowen, RN Outcome: Adequate for Discharge 03/17/2023 1229 by Reynold Bowen, RN Outcome: Adequate for Discharge   Problem: Activity: Goal: Risk for activity intolerance will decrease 03/17/2023 1230 by  Reynold Bowen, RN Outcome: Adequate for Discharge 03/17/2023 1229 by Reynold Bowen, RN Outcome: Adequate for Discharge   Problem: Nutrition: Goal: Adequate nutrition will be maintained 03/17/2023 1230 by Reynold Bowen, RN Outcome: Adequate for Discharge 03/17/2023 1229 by Reynold Bowen, RN Outcome: Adequate for Discharge   Problem: Coping: Goal: Level of anxiety will decrease 03/17/2023 1230 by Reynold Bowen, RN Outcome: Adequate for Discharge 03/17/2023 1229 by Reynold Bowen, RN Outcome: Adequate for Discharge   Problem: Elimination: Goal: Will not experience complications related to bowel motility 03/17/2023 1230 by Reynold Bowen, RN Outcome: Adequate for Discharge 03/17/2023 1229 by Reynold Bowen, RN Outcome: Adequate for Discharge Goal: Will not experience complications related to urinary retention 03/17/2023 1230 by Reynold Bowen, RN Outcome: Adequate for Discharge 03/17/2023 1229 by Reynold Bowen, RN Outcome: Adequate for Discharge   Problem: Pain Management: Goal: General experience of comfort will improve 03/17/2023 1230 by Reynold Bowen, RN Outcome: Adequate for Discharge 03/17/2023 1229 by Reynold Bowen, RN Outcome: Adequate for Discharge   Problem: Safety: Goal: Ability to remain free from injury will improve 03/17/2023 1230 by Reynold Bowen, RN Outcome: Adequate for Discharge 03/17/2023 1229 by Reynold Bowen, RN Outcome: Adequate for Discharge   Problem: Skin Integrity: Goal: Risk for impaired skin integrity will decrease 03/17/2023 1230 by Reynold Bowen, RN Outcome: Adequate for Discharge 03/17/2023 1229 by Reynold Bowen, RN Outcome: Adequate for Discharge

## 2023-03-17 NOTE — Telephone Encounter (Signed)
Pt scheduled to see Dr. Tomasa Rand 04/07/22 at 3:40pm. Appt letter mailed to pt.

## 2023-04-03 ENCOUNTER — Ambulatory Visit: Payer: Medicare PPO | Attending: Physician Assistant | Admitting: Physician Assistant
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
## 2023-04-08 ENCOUNTER — Ambulatory Visit: Payer: Medicare PPO | Admitting: Gastroenterology

## 2023-05-23 ENCOUNTER — Emergency Department (HOSPITAL_COMMUNITY)

## 2023-05-23 ENCOUNTER — Inpatient Hospital Stay (HOSPITAL_COMMUNITY)
Admission: EM | Admit: 2023-05-23 | Discharge: 2023-05-29 | DRG: 176 | Disposition: A | Discharge status: Home Health | Attending: Family Medicine | Admitting: Family Medicine

## 2023-05-23 ENCOUNTER — Encounter (HOSPITAL_COMMUNITY): Payer: Self-pay

## 2023-05-23 ENCOUNTER — Other Ambulatory Visit: Payer: Self-pay

## 2023-05-23 DIAGNOSIS — F17210 Nicotine dependence, cigarettes, uncomplicated: Secondary | ICD-10-CM | POA: Diagnosis present

## 2023-05-23 DIAGNOSIS — E860 Dehydration: Secondary | ICD-10-CM | POA: Diagnosis not present

## 2023-05-23 DIAGNOSIS — I5022 Chronic systolic (congestive) heart failure: Secondary | ICD-10-CM | POA: Diagnosis present

## 2023-05-23 DIAGNOSIS — Z881 Allergy status to other antibiotic agents status: Secondary | ICD-10-CM

## 2023-05-23 DIAGNOSIS — E1165 Type 2 diabetes mellitus with hyperglycemia: Secondary | ICD-10-CM

## 2023-05-23 DIAGNOSIS — Z794 Long term (current) use of insulin: Secondary | ICD-10-CM | POA: Diagnosis not present

## 2023-05-23 DIAGNOSIS — Z89512 Acquired absence of left leg below knee: Secondary | ICD-10-CM | POA: Diagnosis not present

## 2023-05-23 DIAGNOSIS — I110 Hypertensive heart disease with heart failure: Secondary | ICD-10-CM | POA: Diagnosis present

## 2023-05-23 DIAGNOSIS — E785 Hyperlipidemia, unspecified: Secondary | ICD-10-CM | POA: Diagnosis present

## 2023-05-23 DIAGNOSIS — N179 Acute kidney failure, unspecified: Secondary | ICD-10-CM | POA: Diagnosis present

## 2023-05-23 DIAGNOSIS — Z86711 Personal history of pulmonary embolism: Secondary | ICD-10-CM

## 2023-05-23 DIAGNOSIS — Z833 Family history of diabetes mellitus: Secondary | ICD-10-CM | POA: Diagnosis not present

## 2023-05-23 DIAGNOSIS — Z955 Presence of coronary angioplasty implant and graft: Secondary | ICD-10-CM | POA: Diagnosis not present

## 2023-05-23 DIAGNOSIS — I10 Essential (primary) hypertension: Secondary | ICD-10-CM

## 2023-05-23 DIAGNOSIS — I455 Other specified heart block: Secondary | ICD-10-CM | POA: Diagnosis present

## 2023-05-23 DIAGNOSIS — E875 Hyperkalemia: Secondary | ICD-10-CM | POA: Diagnosis present

## 2023-05-23 DIAGNOSIS — Z7982 Long term (current) use of aspirin: Secondary | ICD-10-CM

## 2023-05-23 DIAGNOSIS — Z87892 Personal history of anaphylaxis: Secondary | ICD-10-CM

## 2023-05-23 DIAGNOSIS — I2489 Other forms of acute ischemic heart disease: Secondary | ICD-10-CM | POA: Diagnosis present

## 2023-05-23 DIAGNOSIS — E119 Type 2 diabetes mellitus without complications: Secondary | ICD-10-CM | POA: Diagnosis present

## 2023-05-23 DIAGNOSIS — R079 Chest pain, unspecified: Secondary | ICD-10-CM | POA: Diagnosis not present

## 2023-05-23 DIAGNOSIS — Z79899 Other long term (current) drug therapy: Secondary | ICD-10-CM

## 2023-05-23 DIAGNOSIS — Z7951 Long term (current) use of inhaled steroids: Secondary | ICD-10-CM

## 2023-05-23 DIAGNOSIS — I440 Atrioventricular block, first degree: Secondary | ICD-10-CM | POA: Diagnosis present

## 2023-05-23 DIAGNOSIS — J9610 Chronic respiratory failure, unspecified whether with hypoxia or hypercapnia: Secondary | ICD-10-CM | POA: Diagnosis present

## 2023-05-23 DIAGNOSIS — Z823 Family history of stroke: Secondary | ICD-10-CM | POA: Diagnosis not present

## 2023-05-23 DIAGNOSIS — Z882 Allergy status to sulfonamides status: Secondary | ICD-10-CM | POA: Diagnosis not present

## 2023-05-23 DIAGNOSIS — Z809 Family history of malignant neoplasm, unspecified: Secondary | ICD-10-CM | POA: Diagnosis not present

## 2023-05-23 DIAGNOSIS — Z7901 Long term (current) use of anticoagulants: Secondary | ICD-10-CM | POA: Diagnosis not present

## 2023-05-23 DIAGNOSIS — I2510 Atherosclerotic heart disease of native coronary artery without angina pectoris: Secondary | ICD-10-CM | POA: Diagnosis present

## 2023-05-23 DIAGNOSIS — R0789 Other chest pain: Secondary | ICD-10-CM

## 2023-05-23 DIAGNOSIS — Z8249 Family history of ischemic heart disease and other diseases of the circulatory system: Secondary | ICD-10-CM | POA: Diagnosis not present

## 2023-05-23 DIAGNOSIS — J449 Chronic obstructive pulmonary disease, unspecified: Secondary | ICD-10-CM | POA: Diagnosis present

## 2023-05-23 DIAGNOSIS — R4189 Other symptoms and signs involving cognitive functions and awareness: Secondary | ICD-10-CM | POA: Diagnosis present

## 2023-05-23 DIAGNOSIS — I5020 Unspecified systolic (congestive) heart failure: Secondary | ICD-10-CM | POA: Diagnosis present

## 2023-05-23 DIAGNOSIS — R000 Tachycardia, unspecified: Secondary | ICD-10-CM | POA: Diagnosis present

## 2023-05-23 DIAGNOSIS — Z85118 Personal history of other malignant neoplasm of bronchus and lung: Secondary | ICD-10-CM

## 2023-05-23 DIAGNOSIS — I2699 Other pulmonary embolism without acute cor pulmonale: Secondary | ICD-10-CM | POA: Diagnosis present

## 2023-05-23 DIAGNOSIS — Z1152 Encounter for screening for COVID-19: Secondary | ICD-10-CM | POA: Diagnosis not present

## 2023-05-23 DIAGNOSIS — I959 Hypotension, unspecified: Secondary | ICD-10-CM | POA: Diagnosis not present

## 2023-05-23 DIAGNOSIS — Z91148 Patient's other noncompliance with medication regimen for other reason: Secondary | ICD-10-CM

## 2023-05-23 DIAGNOSIS — Z91041 Radiographic dye allergy status: Secondary | ICD-10-CM

## 2023-05-23 DIAGNOSIS — I5023 Acute on chronic systolic (congestive) heart failure: Secondary | ICD-10-CM | POA: Diagnosis present

## 2023-05-23 DIAGNOSIS — R4182 Altered mental status, unspecified: Secondary | ICD-10-CM | POA: Diagnosis not present

## 2023-05-23 DIAGNOSIS — Z888 Allergy status to other drugs, medicaments and biological substances status: Secondary | ICD-10-CM

## 2023-05-23 LAB — CBC WITH DIFFERENTIAL/PLATELET
Abs Immature Granulocytes: 0.1 10*3/uL — ABNORMAL HIGH (ref 0.00–0.07)
Basophils Absolute: 0.1 10*3/uL (ref 0.0–0.1)
Basophils Relative: 1 %
Eosinophils Absolute: 0.1 10*3/uL (ref 0.0–0.5)
Eosinophils Relative: 1 %
HCT: 34.1 % — ABNORMAL LOW (ref 39.0–52.0)
Hemoglobin: 11.6 g/dL — ABNORMAL LOW (ref 13.0–17.0)
Immature Granulocytes: 1 %
Lymphocytes Relative: 16 %
Lymphs Abs: 2 10*3/uL (ref 0.7–4.0)
MCH: 31.7 pg (ref 26.0–34.0)
MCHC: 34 g/dL (ref 30.0–36.0)
MCV: 93.2 fL (ref 80.0–100.0)
Monocytes Absolute: 0.7 10*3/uL (ref 0.1–1.0)
Monocytes Relative: 6 %
Neutro Abs: 9.4 10*3/uL — ABNORMAL HIGH (ref 1.7–7.7)
Neutrophils Relative %: 75 %
Platelets: 432 10*3/uL — ABNORMAL HIGH (ref 150–400)
RBC: 3.66 MIL/uL — ABNORMAL LOW (ref 4.22–5.81)
RDW: 15.9 % — ABNORMAL HIGH (ref 11.5–15.5)
WBC: 12.4 10*3/uL — ABNORMAL HIGH (ref 4.0–10.5)
nRBC: 0 % (ref 0.0–0.2)

## 2023-05-23 LAB — COMPREHENSIVE METABOLIC PANEL
ALT: 15 U/L (ref 0–44)
AST: 39 U/L (ref 15–41)
Albumin: 2.3 g/dL — ABNORMAL LOW (ref 3.5–5.0)
Alkaline Phosphatase: 173 U/L — ABNORMAL HIGH (ref 38–126)
Anion gap: 6 (ref 5–15)
BUN: 30 mg/dL — ABNORMAL HIGH (ref 8–23)
CO2: 22 mmol/L (ref 22–32)
Calcium: 8.9 mg/dL (ref 8.9–10.3)
Chloride: 106 mmol/L (ref 98–111)
Creatinine, Ser: 1.11 mg/dL (ref 0.61–1.24)
GFR, Estimated: >60 mL/min (ref >60)
Glucose, Bld: 186 mg/dL — ABNORMAL HIGH (ref 70–99)
Potassium: 5.1 mmol/L (ref 3.5–5.1)
Sodium: 134 mmol/L — ABNORMAL LOW (ref 135–145)
Total Bilirubin: 1 mg/dL (ref 0.0–1.2)
Total Protein: 5.4 g/dL — ABNORMAL LOW (ref 6.5–8.1)

## 2023-05-23 LAB — I-STAT CG4 LACTIC ACID, ED
Lactic Acid, Venous: 1.8 mmol/L (ref 0.5–1.9)
Lactic Acid, Venous: 0.9 mmol/L (ref 0.5–1.9)

## 2023-05-23 LAB — TROPONIN I (HIGH SENSITIVITY)
Troponin I (High Sensitivity): 35 ng/L — ABNORMAL HIGH (ref <18)
Troponin I (High Sensitivity): 29 ng/L — ABNORMAL HIGH (ref <18)

## 2023-05-23 LAB — RESP PANEL BY RT-PCR (RSV, FLU A&B, COVID)  RVPGX2
Influenza A by PCR: NEGATIVE
Influenza B by PCR: NEGATIVE
Resp Syncytial Virus by PCR: NEGATIVE
SARS Coronavirus 2 by RT PCR: NEGATIVE

## 2023-05-23 LAB — URINALYSIS, W/ REFLEX TO CULTURE (INFECTION SUSPECTED)
Bilirubin Urine: NEGATIVE
Glucose, UA: 50 mg/dL — AB
Hgb urine dipstick: NEGATIVE
Ketones, ur: NEGATIVE mg/dL
Leukocytes,Ua: NEGATIVE
Nitrite: NEGATIVE
Protein, ur: >=300 mg/dL — AB
Specific Gravity, Urine: 1.015 (ref 1.005–1.030)
pH: 5 (ref 5.0–8.0)

## 2023-05-23 LAB — PROTIME-INR
INR: 0.9 (ref 0.8–1.2)
Prothrombin Time: 12.8 s (ref 11.4–15.2)

## 2023-05-23 LAB — D-DIMER, QUANTITATIVE: D-Dimer, Quant: 1.53 ug{FEU}/mL — ABNORMAL HIGH (ref 0.00–0.50)

## 2023-05-23 LAB — CBG MONITORING, ED: Glucose-Capillary: 152 mg/dL — ABNORMAL HIGH (ref 70–99)

## 2023-05-23 LAB — HEPARIN LEVEL (UNFRACTIONATED): Heparin Unfractionated: <0.1 [IU]/mL — ABNORMAL LOW (ref 0.30–0.70)

## 2023-05-23 LAB — APTT: aPTT: 30 s (ref 24–36)

## 2023-05-23 MED ORDER — TICAGRELOR 90 MG PO TABS
90.0000 mg | ORAL_TABLET | Freq: Two times a day (BID) | ORAL | Status: DC
Start: 2023-05-23 — End: 2023-05-29
  Administered 2023-05-23 – 2023-05-29 (×12): 90 mg via ORAL
  Filled 2023-05-23 (×12): qty 1

## 2023-05-23 MED ORDER — SODIUM CHLORIDE 0.9% FLUSH
3.0000 mL | Freq: Two times a day (BID) | INTRAVENOUS | Status: DC
  Administered 2023-05-23 – 2023-05-29 (×9): 3 mL via INTRAVENOUS

## 2023-05-23 MED ORDER — DIPHENHYDRAMINE HCL 50 MG/ML IJ SOLN
50.0000 mg | Freq: Once | INTRAMUSCULAR | Status: DC
Start: 2023-05-23 — End: 2023-05-23

## 2023-05-23 MED ORDER — HEPARIN (PORCINE) 25000 UT/250ML-% IV SOLN
1050.0000 [IU]/h | INTRAVENOUS | Status: DC
  Administered 2023-05-23 – 2023-05-24 (×2): 1100 [IU]/h via INTRAVENOUS
  Administered 2023-05-25: 1150 [IU]/h via INTRAVENOUS
  Administered 2023-05-26: 1050 [IU]/h via INTRAVENOUS
  Filled 2023-05-23 (×4): qty 250

## 2023-05-23 MED ORDER — INSULIN ASPART 100 UNIT/ML IJ SOLN
0.0000 [IU] | Freq: Three times a day (TID) | INTRAMUSCULAR | Status: DC
  Administered 2023-05-24: 3 [IU] via SUBCUTANEOUS
  Administered 2023-05-25: 1 [IU] via SUBCUTANEOUS
  Administered 2023-05-25: 3 [IU] via SUBCUTANEOUS
  Administered 2023-05-26: 1 [IU] via SUBCUTANEOUS
  Administered 2023-05-26: 3 [IU] via SUBCUTANEOUS
  Administered 2023-05-26 – 2023-05-27 (×2): 1 [IU] via SUBCUTANEOUS
  Administered 2023-05-27: 2 [IU] via SUBCUTANEOUS
  Administered 2023-05-28: 3 [IU] via SUBCUTANEOUS
  Administered 2023-05-29: 1 [IU] via SUBCUTANEOUS

## 2023-05-23 MED ORDER — SODIUM CHLORIDE 0.9 % IV BOLUS
500.0000 mL | Freq: Once | INTRAVENOUS | Status: AC
  Administered 2023-05-23: 500 mL via INTRAVENOUS

## 2023-05-23 MED ORDER — METHYLPREDNISOLONE SODIUM SUCC 40 MG IJ SOLR: 40.0000 mg | Freq: Once | INTRAMUSCULAR | Status: DC

## 2023-05-23 MED ORDER — OXYCODONE HCL 5 MG PO TABS
5.0000 mg | ORAL_TABLET | ORAL | Status: DC | PRN
  Administered 2023-05-23 – 2023-05-28 (×9): 5 mg via ORAL
  Filled 2023-05-23 (×10): qty 1

## 2023-05-23 MED ORDER — INSULIN ASPART 100 UNIT/ML IJ SOLN
0.0000 [IU] | Freq: Every day | INTRAMUSCULAR | Status: DC
  Administered 2023-05-26 – 2023-05-28 (×2): 2 [IU] via SUBCUTANEOUS

## 2023-05-23 MED ORDER — HYDROMORPHONE HCL 1 MG/ML IJ SOLN
0.5000 mg | Freq: Once | INTRAMUSCULAR | Status: AC
  Administered 2023-05-23: 0.5 mg via INTRAVENOUS
  Filled 2023-05-23: qty 1

## 2023-05-23 MED ORDER — POLYETHYLENE GLYCOL 3350 17 G PO PACK: 17.0000 g | PACK | Freq: Every day | ORAL | Status: DC | PRN

## 2023-05-23 MED ORDER — TECHNETIUM TO 99M ALBUMIN AGGREGATED
4.0000 | Freq: Once | INTRAVENOUS | Status: AC | PRN
  Administered 2023-05-23: 4 via INTRAVENOUS

## 2023-05-23 MED ORDER — ATORVASTATIN CALCIUM 40 MG PO TABS
40.0000 mg | ORAL_TABLET | Freq: Every day | ORAL | Status: DC
  Administered 2023-05-23 – 2023-05-28 (×6): 40 mg via ORAL
  Filled 2023-05-23 (×6): qty 1

## 2023-05-23 MED ORDER — ACETAMINOPHEN 650 MG RE SUPP: 650.0000 mg | Freq: Four times a day (QID) | RECTAL | Status: DC | PRN

## 2023-05-23 MED ORDER — HEPARIN BOLUS VIA INFUSION
4500.0000 [IU] | Freq: Once | INTRAVENOUS | Status: AC
  Administered 2023-05-23: 4500 [IU] via INTRAVENOUS
  Filled 2023-05-23: qty 4500

## 2023-05-23 MED ORDER — ACETAMINOPHEN 325 MG PO TABS: 650.0000 mg | ORAL_TABLET | Freq: Four times a day (QID) | ORAL | Status: DC | PRN

## 2023-05-23 MED ORDER — PANTOPRAZOLE SODIUM 40 MG PO TBEC
40.0000 mg | DELAYED_RELEASE_TABLET | Freq: Every day | ORAL | Status: DC
  Administered 2023-05-24 – 2023-05-29 (×6): 40 mg via ORAL
  Filled 2023-05-23 (×6): qty 1

## 2023-05-23 MED ORDER — NITROGLYCERIN 0.4 MG SL SUBL: 0.4000 mg | SUBLINGUAL_TABLET | SUBLINGUAL | Status: DC | PRN

## 2023-05-23 MED ORDER — DIPHENHYDRAMINE HCL 25 MG PO CAPS
50.0000 mg | ORAL_CAPSULE | Freq: Once | ORAL | Status: DC
Start: 2023-05-23 — End: 2023-05-23

## 2023-05-23 NOTE — ED Notes (Signed)
Pt transported to [facility].

## 2023-05-23 NOTE — H&P (Signed)
 History and Physical    [name]:[id number] DOB: [birth year] DOA: 05/23/2023  PCP: Default, Provider, MD   Patient coming from: Home   Chief Complaint: Chest pain   HPI: Martin Thomas is a 68 y.o. male with medical history significant for hypertension, hyperlipidemia, type 2 diabetes mellitus, lung cancer, COPD, CAD, PE in December 2024 on Eliquis, cognitive impairment, and poor adherence with his medications, now presenting with chest pain.  Patient reports waking at approximately 1 PM today with chest pressure.  He describes this as "9/10" in severity, constant, localized to the central chest, and unchanged with nitroglycerin.  He denies shortness of breath or change in his chronic cough.    Patient manages his own medications.  He is not aware that he was supposed to be on a blood thinner and does not believe that he is taking one.   ED Course: Upon arrival to the ED, patient is found to be afebrile and saturating well on room air with tachypnea, tachycardia, and systolic blood pressure in the 90s.  EKG demonstrates sinus tachycardia with first-degree AV nodal block.  Chest x-ray is negative for acute findings.  Labs are most notable for elevated BUN to creatinine ratio, alkaline phosphatase 173, albumin 2.3, WBC 12,400, troponin 35, lactic acid 1.8, negative respiratory virus panel, and D-dimer 1.53.  VQ scan is concerning for progressive PE since December.  Blood cultures were collected in the ED, 500 mL saline bolus was administered, and the patient was started on IV heparin.  Review of Systems:  All other systems reviewed and apart from HPI, are negative.  Past Medical History:  Diagnosis Date   Cancer (HCC)    lung cancer   COPD (chronic obstructive pulmonary disease) (HCC)    Diabetes mellitus without complication (HCC)    Hyperlipemia    Hypertension     Past Surgical History:  Procedure Laterality Date   APPENDECTOMY     BELOW KNEE LEG AMPUTATION  2009    left   CORONARY STENT INTERVENTION N/A 07/19/2022   Procedure: CORONARY STENT INTERVENTION;  Surgeon: Corky Crafts, MD;  Location: [facility] INVASIVE CV LAB;  Service: Cardiovascular;  Laterality: N/A;  RCA   CORONARY STENT PLACEMENT     CORONARY THROMBECTOMY N/A 07/19/2022   Procedure: Coronary Thrombectomy;  Surgeon: Corky Crafts, MD;  Location: [facility] INVASIVE CV LAB;  Service: Cardiovascular;  Laterality: N/A;  RCA   LEFT HEART CATH AND CORONARY ANGIOGRAPHY N/A 07/19/2022   Procedure: LEFT HEART CATH AND CORONARY ANGIOGRAPHY;  Surgeon: Corky Crafts, MD;  Location: [facility] INVASIVE CV LAB;  Service: Cardiovascular;  Laterality: N/A;   LEFT HEART CATHETERIZATION WITH CORONARY ANGIOGRAM N/A 03/24/2012   Procedure: LEFT HEART CATHETERIZATION WITH CORONARY ANGIOGRAM;  Surgeon: Marykay Lex, MD;  Location: [facility] CATH LAB;  Service: Cardiovascular;  Laterality: N/A;   PERCUTANEOUS CORONARY STENT INTERVENTION (PCI-S)  03/24/2012   Procedure: PERCUTANEOUS CORONARY STENT INTERVENTION (PCI-S);  Surgeon: Marykay Lex, MD;  Location: [facility] CATH LAB;  Service: Cardiovascular;;    Social History:   reports that he has been smoking cigarettes. He does not have any smokeless tobacco history on file. He reports that he does not drink alcohol and does not use drugs.  Allergies  Allergen Reactions   Contrast Media [Iodinated Contrast Media] Anaphylaxis   Iodine Anaphylaxis   Sulfamethoxazole-Trimethoprim Nausea And Vomiting and Swelling    Other Reaction(s): Renal failure syndrome   Lisinopril Other (See Comments)    hyperkalemia  Other Reaction(s):  Hyperkalemia   Varenicline     Other Reaction(s): Altered mental status   Ciprofloxacin Other (See Comments)    unknown   Metformin Other (See Comments)    Increased lactic acid  Other Reaction(s): Increased lactic acid level    Family History  Problem Relation Age of Onset   CAD Other    Heart attack Father        died of second MI at 54   Cancer  Father    Heart attack Mother    Heart attack Paternal Uncle 28       died of MI at 42   Diabetes Brother    Stroke Maternal Grandfather      Prior to Admission medications   Medication Sig Start Date End Date Taking? Authorizing Provider  albuterol (PROVENTIL HFA;VENTOLIN HFA) 108 (90 BASE) MCG/ACT inhaler Inhale 2 puffs into the lungs every 6 (six) hours as needed for shortness of breath.     [provider]  apixaban (ELIQUIS) 5 MG TABS tablet Take 2 tablets (10 mg total) by mouth 2 (two) times daily for 3 days, THEN 1 tablet (5 mg total) 2 (two) times daily for 27 days. 03/17/23 04/16/23  Lanae Boast, MD  atorvastatin (LIPITOR) 80 MG tablet Take 1 tablet (80 mg total) by mouth daily. Patient taking differently: Take 40 mg by mouth at bedtime. 07/24/22   Corky Crafts, MD  Blood Glucose Monitoring Suppl (BLOOD GLUCOSE MONITOR SYSTEM) w/Device KIT Use in the morning, at noon, and at bedtime. 07/23/22   Corky Crafts, MD  budesonide-formoterol [facility]) 160-4.5 MCG/ACT inhaler Inhale 2 puffs into the lungs 2 (two) times daily. Patient not taking: Reported on 03/09/2023    [provider]  colchicine 0.6 MG tablet Take 1 tablet (0.6 mg total) by mouth 2 (two) times daily. 08/05/22   Wouk, Wilfred Curtis, MD  cyclobenzaprine (FLEXERIL) 10 MG tablet Take 10 mg by mouth at bedtime.    [provider]  gabapentin (NEURONTIN) 300 MG capsule Take 300 mg by mouth 2 (two) times daily.    [provider]  Glucose Blood (BLOOD GLUCOSE TEST STRIPS) STRP Use in the morning, at noon, and at bedtime. 07/23/22   Corky Crafts, MD  HYDROcodone-acetaminophen (NORCO/VICODIN) 5-325 MG per tablet Take 1 tablet by mouth every 8 (eight) hours as needed. For pain    [provider]  insulin aspart (NOVOLOG) 100 UNIT/ML FlexPen Inject 12 Units into the skin 3 (three) times daily with meals. 07/23/22   Corky Crafts, MD  insulin glargine-yfgn (SEMGLEE) 100  UNIT/ML Pen Inject 5 Units into the skin at bedtime. Reassess to go up on home dose which is 32 u slowly with PCP 03/17/23   Lanae Boast, MD  Insulin Pen Needle 32G X 4 MM MISC Use 4 (four) times daily -  before meals and at bedtime. 07/23/22   Corky Crafts, MD  magnesium oxide (MAG-OX) 400 (240 Mg) MG tablet Take 400 mg by mouth daily.    [provider]  metoprolol succinate (TOPROL-XL) 25 MG 24 hr tablet Take 1 tablet (25 mg total) by mouth at bedtime. 03/17/23 04/16/23  Lanae Boast, MD  metoprolol succinate (TOPROL-XL) 50 MG 24 hr tablet Take 1 tablet (50 mg total) by mouth daily. Take with or immediately following a meal. 07/24/22   Corky Crafts, MD  mirtazapine (REMERON) 30 MG tablet Take 30 mg by mouth at bedtime. 07/29/22   [provider]  nitroGLYCERIN (  NITROSTAT) 0.4 MG SL tablet Place 1 tablet (0.4 mg total) under the tongue every 5 (five) minutes as needed for chest pain. 07/23/22   Perlie Gold, PA-C  omeprazole (PRILOSEC) 40 MG capsule Take 40 mg by mouth daily. 08/31/22   [provider]  Study - EVOLVE-MI - evolocumab (REPATHA) 140 mg/mL SQ injection (PI-Stuckey) Inject 1 mL (140 mg total) into the skin every 14 (fourteen) days. For Investigational Use Only. Inject subcutaneously into abdomen, thigh, or upper arm every 14 days. Rotate injection sites and do not inject into areas where skin is tender, bruised, or red. Please contact [facility] for any questions or concerns regarding this medication. Patient not taking: Reported on 03/10/2023 07/23/22   Perlie Gold, PA-C  ticagrelor (BRILINTA) 90 MG TABS tablet Take 1 tablet (90 mg total) by mouth 2 (two) times daily. 07/23/22   Corky Crafts, MD    Physical Exam: Vitals:   05/23/23 1730 05/23/23 1745 05/23/23 1831 05/23/23 1900  BP: 113/83 111/66  104/63  Pulse: (!) 126 (!) 126  (!) 119  Resp: (!) 27 (!) 24  12  Temp:   98.3 F (36.8 C)   TempSrc:   Oral   SpO2: 100% 100%  100%   Weight:      Height:        Constitutional: NAD, no diaphoresis   Eyes: PERTLA, lids and conjunctivae normal ENMT: Mucous membranes are moist. Posterior pharynx clear of any exudate or lesions.   Neck: supple, no masses  Respiratory: Diminished bilaterally, prolonged expiratory phase. No wheezing.  Cardiovascular: Rate ~120 and regular. No extremity edema.  Abdomen: No distension, no tenderness, soft. Bowel sounds active.  Musculoskeletal: no clubbing / cyanosis. S/p BKA.   Skin: no significant rashes, lesions, ulcers. Warm, dry, well-perfused. Neurologic: CN 2-12 grossly intact. Moving all extremities. Alert and oriented.  Psychiatric: Calm. Cooperative.    Labs and Imaging on Admission: I have personally reviewed following labs and imaging studies  CBC: Recent Labs  Lab 05/23/23 1502  WBC 12.4*  NEUTROABS 9.4*  HGB 11.6*  HCT 34.1*  MCV 93.2  PLT 432*   Basic Metabolic Panel: Recent Labs  Lab 05/23/23 1502  NA 134*  K 5.1  CL 106  CO2 22  GLUCOSE 186*  BUN 30*  CREATININE 1.11  CALCIUM 8.9   GFR: Estimated Creatinine Clearance: 54.7 mL/min (by C-G formula based on SCr of 1.11 mg/dL). Liver Function Tests: Recent Labs  Lab 05/23/23 1502  AST 39  ALT 15  ALKPHOS 173*  BILITOT 1.0  PROT 5.4*  ALBUMIN 2.3*   No results for input(s): "LIPASE", "AMYLASE" in the last 168 hours. No results for input(s): "AMMONIA" in the last 168 hours. Coagulation Profile: Recent Labs  Lab 05/23/23 1502  INR 0.9   Cardiac Enzymes: No results for input(s): "CKTOTAL", "CKMB", "CKMBINDEX", "TROPONINI" in the last 168 hours. BNP (last 3 results) No results for input(s): "PROBNP" in the last 8760 hours. HbA1C: No results for input(s): "HGBA1C" in the last 72 hours. CBG: No results for input(s): "GLUCAP" in the last 168 hours. Lipid Profile: No results for input(s): "CHOL", "HDL", "LDLCALC", "TRIG", "CHOLHDL", "LDLDIRECT" in the last 72 hours. Thyroid Function  Tests: No results for input(s): "TSH", "T4TOTAL", "FREET4", "T3FREE", "THYROIDAB" in the last 72 hours. Anemia Panel: No results for input(s): "VITAMINB12", "FOLATE", "FERRITIN", "TIBC", "IRON", "RETICCTPCT" in the last 72 hours. Urine analysis:    Component Value Date/Time   COLORURINE YELLOW 05/23/2023 1757   APPEARANCEUR  CLEAR 05/23/2023 1757   LABSPEC 1.015 05/23/2023 1757   PHURINE 5.0 05/23/2023 1757   GLUCOSEU 50 (A) 05/23/2023 1757   HGBUR NEGATIVE 05/23/2023 1757   BILIRUBINUR NEGATIVE 05/23/2023 1757   KETONESUR NEGATIVE 05/23/2023 1757   PROTEINUR >=300 (A) 05/23/2023 1757   NITRITE NEGATIVE 05/23/2023 1757   LEUKOCYTESUR NEGATIVE 05/23/2023 1757   Sepsis Labs: @LABRCNTIP (procalcitonin:4,lacticidven:4) ) Recent Results (from the past 240 hours)  Resp panel by RT-PCR (RSV, Flu A&B, Covid) Anterior Nasal Swab     Status: None   Collection Time: 05/23/23  3:02 PM   Specimen: Anterior Nasal Swab  Result Value Ref Range Status   SARS Coronavirus 2 by RT PCR NEGATIVE NEGATIVE Final   Influenza A by PCR NEGATIVE NEGATIVE Final   Influenza B by PCR NEGATIVE NEGATIVE Final    Comment: (NOTE) The Xpert Xpress SARS-CoV-2/FLU/RSV plus assay is intended as an aid in the diagnosis of influenza from Nasopharyngeal swab specimens and should not be used as a sole basis for treatment. Nasal washings and aspirates are unacceptable for Xpert Xpress SARS-CoV-2/FLU/RSV testing.  Fact Sheet for Patients: BloggerCourse.com  Fact Sheet for Healthcare Providers: SeriousBroker.it  This test is not yet approved or cleared by the [place] FDA and has been authorized for detection and/or diagnosis of SARS-CoV-2 by FDA under an Emergency Use Authorization (EUA). This EUA will remain in effect (meaning this test can be used) for the duration of the COVID-19 declaration under Section 564(b)(1) of the Act, 21 U.S.C. section  360bbb-3(b)(1), unless the authorization is terminated or revoked.     Resp Syncytial Virus by PCR NEGATIVE NEGATIVE Final    Comment: (NOTE) Fact Sheet for Patients: BloggerCourse.com  Fact Sheet for Healthcare Providers: SeriousBroker.it  This test is not yet approved or cleared by the [place] FDA and has been authorized for detection and/or diagnosis of SARS-CoV-2 by FDA under an Emergency Use Authorization (EUA). This EUA will remain in effect (meaning this test can be used) for the duration of the COVID-19 declaration under Section 564(b)(1) of the Act, 21 U.S.C. section 360bbb-3(b)(1), unless the authorization is terminated or revoked.  Performed at [facility], [address], [place], [place] [address]      Radiological Exams on Admission: NM Pulmonary Perfusion Result Date: 05/23/2023 CLINICAL DATA:  Pulmonary embolism (PE) suspected, high prob Chest pain. EXAM: NUCLEAR MEDICINE PERFUSION LUNG SCAN TECHNIQUE: Perfusion images were obtained in multiple projections after intravenous injection of radiopharmaceutical. Ventilation scans intentionally deferred if perfusion scan and chest x-ray adequate for interpretation during COVID 19 epidemic. RADIOPHARMACEUTICALS:  4 mCi Tc-27m MAA IV COMPARISON:  Chest radiograph performed same day, perfusion scan 03/10/2023 FINDINGS: Diminished perfusion in the right upper lobe, previous perfusion demonstrated segmental anomaly, currently appearing lobar. No abnormalities seen on concurrent chest radiograph. IMPRESSION: Diminished perfusion to the right upper lobe, which has progressed from prior perfusion scan. Findings may represent progressive pulmonary embolus since December. Electronically Signed   By: Narda Rutherford M.D.   On: 05/23/2023 19:29   DG Chest Portable 1 View Result Date: 05/23/2023 CLINICAL DATA:  Chest pain. EXAM: PORTABLE CHEST 1 VIEW COMPARISON:  03/09/2023, PET  CT 11/28/2022 FINDINGS: Heart is normal in size. Stable mediastinal contours, similar right paratracheal thickening without adenopathy on prior PET. Pulmonary vasculature is normal. No consolidation, pleural effusion, or pneumothorax. Pulmonary nodules on prior PET are not seen on the current exam. On limited assessment, no acute osseous abnormalities are seen. IMPRESSION: 1. No acute chest findings.  2. Pulmonary nodules on prior PET are not seen on the current exam. Electronically Signed   By: Narda Rutherford M.D.   On: 05/23/2023 19:25    EKG: Independently reviewed. Sinus tachycardia, 1st degree AV block.   Assessment/Plan   1. Pulmonary embolism  - BP has been stable but he is persistently tachycardic and PESI very high    - Continue IV heparin for now, continue cardiac monitoring, check echcardiogram    2. CAD; elevated troponin   - Troponin minimally elevated and decreasing in setting of acute PE    - Continue Lipitor, Brilinta   3. Hypertension  - SBP 90s in ED and antihypertensives held on admission    4. Type II DM  - A1c was 8.2% in December 2024  - Check CBGs and use low-intensity SSI   5. Chronic HFrEF  - EF was 35-40% on TTE from December 2024  - Appears compensated   6. COPD  - Not in exacerbation  - Continue ICS-LABA and as-needed albuterol    7. Cognitive impairment  - Delirium precautions  - Needs help with managing his medications at home, TOC consulted    DVT prophylaxis: IV heparin  Code Status: Full  Level of Care: Level of care: Progressive Family Communication: None present   Disposition Plan:  Patient is from: Home  Anticipated d/c is to: TBD Anticipated d/c date is: 05/26/23  Patient currently: Pending echocardiogram, tolerance of anticoagulation, hemodynamic stability   Consults called: None Admission status: Inpatient    Briscoe Deutscher, MD [facility]  05/23/2023, 8:21 PM

## 2023-05-23 NOTE — ED Provider Notes (Signed)
 [facility] AT [facility] Provider Note   CSN: [id number] Arrival date & time: 05/23/23  1427     History  Chief Complaint  Patient presents with   Chest Pain    Martin Thomas is a 68 y.o. male.  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  Patient reports that 2 hours prior to arrival he started having chest pressure that radiated across his chest.  Initially was 9/10 in severity.  Now 7/10 in severity after aspirin.  Took some nitroglycerin without relief.  No diaphoresis or vomiting.  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  Denies any fevers, cough, shortness of breath, dizziness, or syncope.  Lives at home with his brother.  Patient says that he manages his own medications but cannot recall what medications he is on.       Home Medications Prior to Admission medications   Medication Sig Start Date End Date Taking? Authorizing Provider  aspirin EC 81 MG tablet Take 81 mg by mouth daily. Swallow whole.   Yes [provider]  albuterol (PROVENTIL HFA;VENTOLIN HFA) 108 (90 BASE) MCG/ACT inhaler Inhale 2 puffs into the lungs every 6 (six) hours as needed for shortness of breath.     [provider]  apixaban (ELIQUIS) 5 MG TABS tablet Take 2 tablets (10 mg total) by mouth 2 (two) times daily for 3 days, THEN 1 tablet (5 mg total) 2 (two) times daily for 27 days. 03/17/23 04/16/23  Lanae Boast, MD  atorvastatin (LIPITOR) 80 MG tablet Take 1 tablet (80 mg total) by mouth daily. Patient taking differently: Take 40 mg by mouth at bedtime. 07/24/22   Corky Crafts, MD  Blood Glucose Monitoring Suppl (BLOOD GLUCOSE MONITOR SYSTEM) w/Device KIT Use in the morning, at noon, and at bedtime. 07/23/22   Corky Crafts, MD  budesonide-formoterol [facility])  160-4.5 MCG/ACT inhaler Inhale 2 puffs into the lungs 2 (two) times daily. Patient not taking: Reported on 03/09/2023    [provider]  colchicine 0.6 MG tablet Take 1 tablet (0.6 mg total) by mouth 2 (two) times daily. 08/05/22   Wouk, Wilfred Curtis, MD  cyclobenzaprine (FLEXERIL) 10 MG tablet Take 10 mg by mouth at bedtime.    [provider]  gabapentin (NEURONTIN) 300 MG capsule Take 300 mg by mouth 2 (two) times daily.    [provider]  Glucose Blood (BLOOD GLUCOSE TEST STRIPS) STRP Use in the morning, at noon, and at bedtime. 07/23/22   Corky Crafts, MD  HYDROcodone-acetaminophen (NORCO/VICODIN) 5-325 MG per tablet Take 1 tablet by mouth every 8 (eight) hours as needed. For pain    [provider]  insulin aspart (NOVOLOG) 100 UNIT/ML FlexPen Inject 12 Units into the skin 3 (three) times daily with meals. 07/23/22   Corky Crafts, MD  insulin glargine-yfgn (SEMGLEE) 100 UNIT/ML Pen Inject 5 Units into the skin at bedtime. Reassess to go up on home dose which is 32 u slowly with PCP 03/17/23   Lanae Boast, MD  Insulin Pen Needle 32G X 4 MM MISC Use 4 (four) times daily -  before meals and at bedtime. 07/23/22   Corky Crafts, MD  magnesium oxide (MAG-OX) 400 (240 Mg) MG tablet Take 400 mg by mouth daily.    [provider]  metoprolol succinate (TOPROL-XL) 25 MG 24 hr tablet Take 1 tablet (25 mg total) by mouth at bedtime. 03/17/23 04/16/23  Lanae Boast, MD  metoprolol succinate (TOPROL-XL) 50 MG 24 hr tablet Take 1 tablet (50 mg total) by mouth daily. Take with or immediately following a meal. 07/24/22   Corky Crafts, MD  mirtazapine (REMERON) 30 MG tablet Take 30 mg by mouth at bedtime. 07/29/22   [provider]  nitroGLYCERIN (NITROSTAT) 0.4 MG SL tablet Place 1 tablet (0.4 mg total) under the tongue every 5 (five) minutes as needed for chest pain. 07/23/22   Perlie Gold, PA-C  omeprazole (PRILOSEC) 40 MG capsule Take  40 mg by mouth daily. 08/31/22   [provider]  Study - EVOLVE-MI - evolocumab (REPATHA) 140 mg/mL SQ injection (PI-Stuckey) Inject 1 mL (140 mg total) into the skin every 14 (fourteen) days. For Investigational Use Only. Inject subcutaneously into abdomen, thigh, or upper arm every 14 days. Rotate injection sites and do not inject into areas where skin is tender, bruised, or red. Please contact [facility] for any questions or concerns regarding this medication. Patient not taking: Reported on 03/10/2023 07/23/22   Perlie Gold, PA-C  ticagrelor (BRILINTA) 90 MG TABS tablet Take 1 tablet (90 mg total) by mouth 2 (two) times daily. 07/23/22   Corky Crafts, MD      Allergies    Contrast media [iodinated contrast media], Iodine, Sulfamethoxazole-trimethoprim, Lisinopril, Varenicline, Ciprofloxacin, and Metformin    Review of Systems   Review of Systems  Physical Exam Updated Vital Signs BP 104/63   Pulse (!) 119   Temp 98.3 F (36.8 C) (Oral)   Resp 12   Ht 5\' 9"  (1.753 m)   Wt 59.9 kg   SpO2 100%   BMI 19.50 kg/m  Physical Exam Vitals and nursing note reviewed.  Constitutional:      General: He is not in acute distress.    Appearance: He is well-developed.  HENT:     Head: Normocephalic and atraumatic.     Right Ear: External ear normal.     Left Ear: External ear normal.     Nose: Nose normal.  Eyes:     Extraocular Movements: Extraocular movements intact.     Conjunctiva/sclera: Conjunctivae normal.     Pupils: Pupils are equal, round, and reactive to light.  Cardiovascular:     Rate and Rhythm: Regular rhythm. Tachycardia present.     Heart sounds: Normal heart sounds.  Pulmonary:     Effort: Pulmonary effort is normal. No respiratory distress.     Breath sounds: Normal breath sounds.  Musculoskeletal:     Cervical back: Normal range of motion and neck supple.  Skin:    General: Skin is warm and dry.  Neurological:     Mental Status: He is  alert. Mental status is at baseline.  Psychiatric:        Mood and Affect: Mood normal.        Behavior: Behavior normal.     ED Results / Procedures / Treatments   Labs (all labs ordered are listed, but only abnormal results are displayed) Labs Reviewed  COMPREHENSIVE METABOLIC PANEL - Abnormal; Notable for the following components:      Result Value   Sodium 134 (*)    Glucose, Bld 186 (*)    BUN 30 (*)    Total Protein 5.4 (*)    Albumin 2.3 (*)    Alkaline Phosphatase 173 (*)  All other components within normal limits  CBC WITH DIFFERENTIAL/PLATELET - Abnormal; Notable for the following components:   WBC 12.4 (*)    RBC 3.66 (*)    Hemoglobin 11.6 (*)    HCT 34.1 (*)    RDW 15.9 (*)    Platelets 432 (*)    Neutro Abs 9.4 (*)    Abs Immature Granulocytes 0.10 (*)    All other components within normal limits  URINALYSIS, W/ REFLEX TO CULTURE (INFECTION SUSPECTED) - Abnormal; Notable for the following components:   Glucose, UA 50 (*)    Protein, ur >=300 (*)    Bacteria, UA RARE (*)    All other components within normal limits  D-DIMER, QUANTITATIVE - Abnormal; Notable for the following components:   D-Dimer, Quant 1.53 (*)    All other components within normal limits  HEPARIN LEVEL (UNFRACTIONATED) - Abnormal; Notable for the following components:   Heparin Unfractionated <0.10 (*)    All other components within normal limits  TROPONIN I (HIGH SENSITIVITY) - Abnormal; Notable for the following components:   Troponin I (High Sensitivity) 35 (*)    All other components within normal limits  TROPONIN I (HIGH SENSITIVITY) - Abnormal; Notable for the following components:   Troponin I (High Sensitivity) 29 (*)    All other components within normal limits  RESP PANEL BY RT-PCR (RSV, FLU A&B, COVID)  [id number]  CULTURE, BLOOD (ROUTINE X 2)  CULTURE, BLOOD (ROUTINE X 2)  PROTIME-INR  APTT  HEPARIN LEVEL (UNFRACTIONATED)  BASIC METABOLIC PANEL  MAGNESIUM  CBC  I-STAT  CG4 LACTIC ACID, ED  I-STAT CG4 LACTIC ACID, ED    EKG EKG Interpretation Date/Time:  Friday May 23 2023 17:28:55 EST Ventricular Rate:  120 PR Interval:  208 QRS Duration:  87 QT Interval:  327 QTC Calculation: 462 R Axis:   91  Text Interpretation: Sinus tachycardia Borderline prolonged PR interval Right axis deviation Borderline low voltage, extremity leads Nonspecific T abnormalities, inferior leads Confirmed by Vonita Moss [phone] on 05/23/2023 5:30:06 PM  Radiology NM Pulmonary Perfusion Result Date: 05/23/2023 CLINICAL DATA:  Pulmonary embolism (PE) suspected, high prob Chest pain. EXAM: NUCLEAR MEDICINE PERFUSION LUNG SCAN TECHNIQUE: Perfusion images were obtained in multiple projections after intravenous injection of radiopharmaceutical. Ventilation scans intentionally deferred if perfusion scan and chest x-ray adequate for interpretation during COVID 19 epidemic. RADIOPHARMACEUTICALS:  4 mCi Tc-34m MAA IV COMPARISON:  Chest radiograph performed same day, perfusion scan 03/10/2023 FINDINGS: Diminished perfusion in the right upper lobe, previous perfusion demonstrated segmental anomaly, currently appearing lobar. No abnormalities seen on concurrent chest radiograph. IMPRESSION: Diminished perfusion to the right upper lobe, which has progressed from prior perfusion scan. Findings may represent progressive pulmonary embolus since December. Electronically Signed   By: Narda Rutherford M.D.   On: 05/23/2023 19:29   DG Chest Portable 1 View Result Date: 05/23/2023 CLINICAL DATA:  Chest pain. EXAM: PORTABLE CHEST 1 VIEW COMPARISON:  03/09/2023, PET CT 11/28/2022 FINDINGS: Heart is normal in size. Stable mediastinal contours, similar right paratracheal thickening without adenopathy on prior PET. Pulmonary vasculature is normal. No consolidation, pleural effusion, or pneumothorax. Pulmonary nodules on prior PET are not seen on the current exam. On limited assessment, no acute osseous  abnormalities are seen. IMPRESSION: 1. No acute chest findings. 2. Pulmonary nodules on prior PET are not seen on the current exam. Electronically Signed   By: Narda Rutherford M.D.   On: 05/23/2023 19:25    Procedures Procedures    Medications  Ordered in ED Medications  heparin ADULT infusion 100 units/mL (25000 units/262mL) (1,100 Units/hr Intravenous New Bag/Given 05/23/23 1814)  atorvastatin (LIPITOR) tablet 40 mg (has no administration in time range)  pantoprazole (PROTONIX) EC tablet 40 mg (has no administration in time range)  ticagrelor (BRILINTA) tablet 90 mg (has no administration in time range)  insulin aspart (novoLOG) injection 0-6 Units (has no administration in time range)  insulin aspart (novoLOG) injection 0-5 Units (has no administration in time range)  sodium chloride flush (NS) 0.9 % injection 3 mL (has no administration in time range)  acetaminophen (TYLENOL) tablet 650 mg (has no administration in time range)    Or  acetaminophen (TYLENOL) suppository 650 mg (has no administration in time range)  oxyCODONE (Oxy IR/ROXICODONE) immediate release tablet 5 mg (has no administration in time range)  polyethylene glycol (MIRALAX / GLYCOLAX) packet 17 g (has no administration in time range)  HYDROmorphone (DILAUDID) injection 0.5 mg (0.5 mg Intravenous Given 05/23/23 1537)  technetium albumin aggregated (MAA) injection solution 4 millicurie (4 millicuries Intravenous Contrast Given 05/23/23 1656)  sodium chloride 0.9 % bolus 500 mL (0 mLs Intravenous Stopped 05/23/23 2036)  heparin bolus via infusion 4,500 Units (4,500 Units Intravenous Bolus from Bag 05/23/23 1814)    ED Course/ Medical Decision Making/ A&P Clinical Course as of 05/23/23 2101  Fri May 23, 2023  2011 Dr Antionette Char from hospitalist to admit.  [RP]    Clinical Course User Index [RP] Rondel Baton, MD                                 Medical Decision Making Amount and/or Complexity of Data Reviewed Labs:  ordered. Radiology: ordered.  Risk Prescription drug management. Decision regarding hospitalization.   Sadao Weyer is a 68 y.o. male with comorbidities that complicate the patient evaluation including PE on Eliquis, COPD, hypertension, hyperlipidemia, and MI status post PCI on Brilinta who presents emergency department chest pain.     Initial Ddx:  PE, MI, pneumonia, pneumothorax, aortic dissection  MDM/Course:  Patient presents emergency department chest pain.  Does have a history of PE and supposed be on Eliquis.  After talking to the patient and I am little bit concerned that he may not be the most compliant with his medications.  He is tachycardic but not hypoxic on arrival.  He had an EKG that showed sinus tachycardia with serial troponins that showed that they were marginally elevated but not rising.  ACS felt to be less likely given these findings.  Has an anaphylactic reaction to contrast media so he had a VQ scan which showed a defect which may reflect worsening clot burden.  He remains tachycardic and with his marginally elevated troponin feel that he would benefit from observation and initiation of anticoagulation to make sure that he does not worsen.  Upon re-evaluation was stable.  Admitted to hospitalist.  This patient presents to the ED for concern of complaints listed in HPI, this involves an extensive number of treatment options, and is a complaint that carries with it a high risk of complications and morbidity. Disposition including potential need for admission considered.   Dispo: Admit to Floor  Records reviewed Outpatient Clinic Notes The following labs were independently interpreted: Serial Troponins and show  demand ischemia I independently reviewed the following imaging with scope of interpretation limited to determining acute life threatening conditions related to emergency care: Chest x-ray and agree with  the radiologist interpretation with the following  exceptions: none I personally reviewed and interpreted cardiac monitoring: sinus tachycardia I personally reviewed and interpreted the pt's EKG: see above for interpretation  I have reviewed the patients home medications and made adjustments as needed Consults: Hospitalist Social Determinants of health:  Elderly  Portions of this note were generated with Scientist, clinical (histocompatibility and immunogenetics). Dictation errors may occur despite best attempts at proofreading.    CRITICAL CARE Performed by: Rondel Baton   Total critical care time: 30 minutes  Critical care time was exclusive of separately billable procedures and treating other patients.  Critical care was necessary to treat or prevent imminent or life-threatening deterioration.  Critical care was time spent personally by me on the following activities: development of treatment plan with patient and/or surrogate as well as nursing, discussions with consultants, evaluation of patient's response to treatment, examination of patient, obtaining history from patient or surrogate, ordering and performing treatments and interventions, ordering and review of laboratory studies, ordering and review of radiographic studies, pulse oximetry and re-evaluation of patient's condition.   Final Clinical Impression(s) / ED Diagnoses Final diagnoses:  Atypical chest pain  Sinus tachycardia  Acute pulmonary embolism, unspecified pulmonary embolism type, unspecified whether acute cor pulmonale present [facility])    Rx / DC Orders ED Discharge Orders     None         Rondel Baton, MD 05/23/23 2101

## 2023-05-23 NOTE — ED Provider Triage Note (Signed)
 Emergency Medicine Provider Triage Evaluation Note  Martin Thomas , a 68 y.o. male  was evaluated in triage.  Pt complains of sudden onset chest pain starting at around 1:30 PM.  History of CAD previous stents.  Tachycardic to about 130.  Does not appear to be A-fib but there is some P wave aberrancy. Denies a history of pulmonary embolus.  Patient is unsure if he is on blood thinners.  Slightly altered..  Review of Systems  Positive: Chest pain Negative: Fever  Physical Exam  BP (!) 107/59   Pulse (!) 130   Temp 97.7 F (36.5 C) (Oral)   Resp (!) 28   Ht 5\' 9"  (1.753 m)   Wt 59.9 kg   SpO2 100%   BMI 19.50 kg/m  Gen:   Awake, no distress   Resp:  Normal effort , Ronchi MSK:   Moves extremities without difficulty  Other:  Tachycardic  Medical Decision Making  Medically screening exam initiated at 2:44 PM.  Appropriate orders placed.  Zaion Hreha was informed that the remainder of the evaluation will be completed by another provider, this initial triage assessment does not replace that evaluation, and the importance of remaining in the ED until their evaluation is complete.     Arthor Captain, PA-C 05/23/23 1447

## 2023-05-23 NOTE — ED Notes (Signed)
 Hospitalist at bedside

## 2023-05-23 NOTE — ED Triage Notes (Signed)
 Pt BIB [facility] from home with C/O 9/10 CP that feels like pressure. Pt took 1 nitroglycerin at home with no relief. 324 ASA given by EMS. Per EMS pt is non-compliant with meds as he has mild dementia and forgets. AO to everything but time.   EMS vitals: BP 98/64 HR 120s SpO2 100% RA

## 2023-05-23 NOTE — ED Notes (Signed)
Pt given bag lunch and drink

## 2023-05-23 NOTE — Progress Notes (Signed)
 ANTICOAGULATION CONSULT NOTE  Pharmacy Consult for Heparin Indication: pulmonary embolus  Allergies  Allergen Reactions   Contrast Media [Iodinated Contrast Media] Anaphylaxis   Iodine Anaphylaxis   Sulfamethoxazole-Trimethoprim Nausea And Vomiting and Swelling    Other Reaction(s): Renal failure syndrome   Lisinopril Other (See Comments)    hyperkalemia  Other Reaction(s): Hyperkalemia   Varenicline     Other Reaction(s): Altered mental status   Ciprofloxacin Other (See Comments)    unknown   Metformin Other (See Comments)    Increased lactic acid  Other Reaction(s): Increased lactic acid level    Patient Measurements: Height: 5\' 9"  (175.3 cm) Weight: 59.9 kg (132 lb 0.9 oz) IBW/kg (Calculated) : 70.7 Heparin Dosing Weight: 59.9 kg  Vital Signs: Temp: 97.7 F (36.5 C) (03/07 1435) Temp Source: Oral (03/07 1435) BP: 113/83 (03/07 1730) Pulse Rate: 126 (03/07 1730)  Labs: Recent Labs    05/23/23 1502 05/23/23 1635  HGB 11.6*  --   HCT 34.1*  --   PLT 432*  --   APTT 30  --   LABPROT 12.8  --   INR 0.9  --   HEPARINUNFRC <0.10*  --   CREATININE 1.11  --   TROPONINIHS 35* 29*    Estimated Creatinine Clearance: 54.7 mL/min (by C-G formula based on SCr of 1.11 mg/dL).   Medical History: Past Medical History:  Diagnosis Date   Cancer (HCC)    lung cancer   COPD (chronic obstructive pulmonary disease) (HCC)    Diabetes mellitus without complication (HCC)    Hyperlipemia    Hypertension     Medications:  (Not in a hospital admission)  Scheduled:  Infusions:  PRN: nitroGLYCERIN  Assessment: 68 yom with a history of PE, COPD, hypertension, hyperlipidemia, and 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 Patient is presenting with chest pain. Heparin per pharmacy consult placed for pulmonary embolus.  Provider note mentions on eliquis prior to arrival. Patient does not recall being on this and is not on his med list. EPIC is showing 30 day supply filled  03/17/23.  Imaging is pending.  Baseline heparin level is <0.1. Would suggest has not been taking anti-Xa anticoagulant. aPTT 30 Hgb 11.6; plt 432  Goal of Therapy:  Heparin level 0.3-0.7 units/ml Monitor platelets by anticoagulation protocol: Yes   Plan:  Give IV heparin 4500 units bolus x 1 Start heparin infusion at 1100 units/hr Check anti-Xa level in 8 hours and daily while on heparin Continue to monitor H&H and platelets  Delmar Landau, PharmD, BCPS 05/23/2023 6:03 PM ED Clinical Pharmacist -  [phone]

## 2023-05-24 ENCOUNTER — Inpatient Hospital Stay (HOSPITAL_COMMUNITY)

## 2023-05-24 DIAGNOSIS — R079 Chest pain, unspecified: Secondary | ICD-10-CM | POA: Diagnosis not present

## 2023-05-24 DIAGNOSIS — I2699 Other pulmonary embolism without acute cor pulmonale: Secondary | ICD-10-CM | POA: Diagnosis not present

## 2023-05-24 DIAGNOSIS — R000 Tachycardia, unspecified: Secondary | ICD-10-CM

## 2023-05-24 DIAGNOSIS — R0789 Other chest pain: Secondary | ICD-10-CM | POA: Diagnosis not present

## 2023-05-24 LAB — ECHOCARDIOGRAM COMPLETE
AR max vel: 2.23 cm2
AV Area VTI: 2.18 cm2
AV Area mean vel: 2.19 cm2
AV Mean grad: 10 mmHg
AV Peak grad: 19.2 mmHg
Ao pk vel: 2.19 m/s
Calc EF: 34.7 %
Height: 69 in
MV M vel: 4.32 m/s
MV Peak grad: 74.6 mmHg
P 1/2 time: 309 ms
S' Lateral: 4 cm
Single Plane A2C EF: 34 %
Single Plane A4C EF: 36.5 %
Weight: 2112.89 [oz_av]

## 2023-05-24 LAB — CBC
HCT: 29.9 % — ABNORMAL LOW (ref 39.0–52.0)
Hemoglobin: 9.9 g/dL — ABNORMAL LOW (ref 13.0–17.0)
MCH: 31 pg (ref 26.0–34.0)
MCHC: 33.1 g/dL (ref 30.0–36.0)
MCV: 93.7 fL (ref 80.0–100.0)
Platelets: 362 10*3/uL (ref 150–400)
RBC: 3.19 MIL/uL — ABNORMAL LOW (ref 4.22–5.81)
RDW: 15.8 % — ABNORMAL HIGH (ref 11.5–15.5)
WBC: 11.9 10*3/uL — ABNORMAL HIGH (ref 4.0–10.5)
nRBC: 0 % (ref 0.0–0.2)

## 2023-05-24 LAB — HEPARIN LEVEL (UNFRACTIONATED)
Heparin Unfractionated: 0.4 [IU]/mL (ref 0.30–0.70)
Heparin Unfractionated: 0.32 [IU]/mL (ref 0.30–0.70)

## 2023-05-24 LAB — CBG MONITORING, ED
Glucose-Capillary: 140 mg/dL — ABNORMAL HIGH (ref 70–99)
Glucose-Capillary: 255 mg/dL — ABNORMAL HIGH (ref 70–99)

## 2023-05-24 LAB — BASIC METABOLIC PANEL
Anion gap: 7 (ref 5–15)
BUN: 27 mg/dL — ABNORMAL HIGH (ref 8–23)
CO2: 17 mmol/L — ABNORMAL LOW (ref 22–32)
Calcium: 8.4 mg/dL — ABNORMAL LOW (ref 8.9–10.3)
Chloride: 105 mmol/L (ref 98–111)
Creatinine, Ser: 1.04 mg/dL (ref 0.61–1.24)
GFR, Estimated: >60 mL/min (ref >60)
Glucose, Bld: 163 mg/dL — ABNORMAL HIGH (ref 70–99)
Potassium: 4.1 mmol/L (ref 3.5–5.1)
Sodium: 129 mmol/L — ABNORMAL LOW (ref 135–145)

## 2023-05-24 LAB — GLUCOSE, CAPILLARY
Glucose-Capillary: 187 mg/dL — ABNORMAL HIGH (ref 70–99)
Glucose-Capillary: 205 mg/dL — ABNORMAL HIGH (ref 70–99)

## 2023-05-24 LAB — MAGNESIUM: Magnesium: 1.5 mg/dL — ABNORMAL LOW (ref 1.7–2.4)

## 2023-05-24 MED ORDER — MAGNESIUM SULFATE 2 GM/50ML IV SOLN
2.0000 g | Freq: Once | INTRAVENOUS | Status: AC
  Administered 2023-05-24: 2 g via INTRAVENOUS
  Filled 2023-05-24: qty 50

## 2023-05-24 MED ORDER — MENTHOL 3 MG MT LOZG
1.0000 | LOZENGE | OROMUCOSAL | Status: DC | PRN
  Administered 2023-05-25 (×4): 3 mg via ORAL
  Filled 2023-05-24 (×3): qty 9

## 2023-05-24 MED ORDER — GUAIFENESIN 100 MG/5ML PO LIQD
5.0000 mL | ORAL | Status: DC | PRN
  Administered 2023-05-25 – 2023-05-28 (×11): 5 mL via ORAL
  Filled 2023-05-24 (×12): qty 10

## 2023-05-24 NOTE — ED Notes (Signed)
 This nurse was altered to the philip's monitor of the patient reading "apnea" with a respiration rate of 0. When this nurse went into the patient's room, patient was observed breathing and was easily woken from sleep. Patient's current respiration rate at 25. Attending Briscoe Deutscher, MD notified.

## 2023-05-24 NOTE — Progress Notes (Signed)
 ANTICOAGULATION CONSULT NOTE  Pharmacy Consult for Heparin Indication: pulmonary embolus  Allergies  Allergen Reactions   Contrast Media [Iodinated Contrast Media] Anaphylaxis   Iodine Anaphylaxis   Sulfamethoxazole-Trimethoprim Nausea And Vomiting and Swelling    Other Reaction(s): Renal failure syndrome   Lisinopril Other (See Comments)    hyperkalemia  Other Reaction(s): Hyperkalemia   Varenicline     Other Reaction(s): Altered mental status   Ciprofloxacin Other (See Comments)    unknown   Metformin Other (See Comments)    Increased lactic acid  Other Reaction(s): Increased lactic acid level    Patient Measurements: Height: 5\' 9"  (175.3 cm) Weight: 59.9 kg (132 lb 0.9 oz) IBW/kg (Calculated) : 70.7 Heparin Dosing Weight: 59.9 kg  Vital Signs: Temp: 97.8 F (36.6 C) (03/08 0442) Temp Source: Oral (03/08 0442) BP: 101/53 (03/08 0318) Pulse Rate: 114 (03/08 0319)  Labs: Recent Labs    05/23/23 1502 05/23/23 1635 05/24/23 0345  HGB 11.6*  --  9.9*  HCT 34.1*  --  29.9*  PLT 432*  --  362  APTT 30  --   --   LABPROT 12.8  --   --   INR 0.9  --   --   HEPARINUNFRC <0.10*  --  0.40  CREATININE 1.11  --   --   TROPONINIHS 35* 29*  --     Estimated Creatinine Clearance: 54.7 mL/min (by C-G formula based on SCr of 1.11 mg/dL).   Medical History: Past Medical History:  Diagnosis Date   Cancer (HCC)    lung cancer   COPD (chronic obstructive pulmonary disease) (HCC)    Diabetes mellitus without complication (HCC)    Hyperlipemia    Hypertension     Medications:  (Not in a hospital admission)  Scheduled:   atorvastatin  40 mg Oral QHS   insulin aspart  0-5 Units Subcutaneous QHS   insulin aspart  0-6 Units Subcutaneous TID WC   pantoprazole  40 mg Oral Daily   sodium chloride flush  3 mL Intravenous Q12H   ticagrelor  90 mg Oral BID   Infusions:   heparin 1,100 Units/hr (05/23/23 1814)   PRN: acetaminophen **OR** acetaminophen, oxyCODONE,  polyethylene glycol  Assessment: 68 yom with a history of PE, COPD, hypertension, hyperlipidemia, and 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 Patient is presenting with chest pain. Heparin per pharmacy consult placed for pulmonary embolus.  Provider note mentions on eliquis prior to arrival. Patient does not recall being on this and is not on his med list. EPIC is showing 30 day supply filled 03/17/23.  Imaging is pending.  Baseline heparin level is <0.1. Would suggest has not been taking anti-Xa anticoagulant. aPTT 30 Hgb 11.6; plt 432  3/8 AM update:  Heparin level therapeutic   Goal of Therapy:  Heparin level 0.3-0.7 units/ml Monitor platelets by anticoagulation protocol: Yes   Plan:  Cont heparin infusion at 1100 units/hr Check anti-Xa level in 8 hours and daily while on heparin Continue to monitor H&H and platelets  Abran Duke, PharmD, BCPS Clinical Pharmacist Phone: [phone]

## 2023-05-24 NOTE — Progress Notes (Signed)
 PROGRESS NOTE    Martin Thomas  OZH:[id number] DOB: 1955-09-08 DOA: 05/23/2023 PCP: Default, Provider, MD    Brief Narrative:  Martin Thomas is a 68 y.o. male with medical history significant for hypertension, hyperlipidemia, type 2 diabetes mellitus, lung cancer, COPD, CAD, PE in December 2024 on Eliquis, cognitive impairment, and poor adherence with his medications, now presenting with chest pain.   Patient reports waking at approximately 1 PM today with chest pressure.  He describes this as "9/10" in severity, constant, localized to the central chest, and unchanged with nitroglycerin.  He denies shortness of breath or change in his chronic cough.     Patient manages his own medications.  He is not aware that he was supposed to be on a blood thinner and does not believe that he is taking one.    Assessment and Plan:  Pulmonary embolism  -has not been taking his elquis at home-- unclear how long -v/q done with increased PE - Continue IV heparin for now, continue cardiac monitoring, check echcardiogram     CAD; elevated troponin   - Troponin minimally elevated and decreasing in setting of acute PE    - Continue Lipitor, Brilinta    Hypertension  - SBP 90s in ED and antihypertensives held on admission      Type II DM  - A1c was 8.2% in December 2024  - SSI   Chronic HFrEF  - EF was 35-40% on TTE from December 2024  - echo pending   COPD  - Not in exacerbation  - Continue ICS-LABA and as-needed albuterol      Cognitive impairment  - Delirium precautions  - Needs help with managing his medications at home, TOC consulted    Hypomagnesemia -replete  DVT prophylaxis:     Code Status: Full Code Family Communication:   Disposition Plan:  Level of care: Progressive Status is: Inpatient Remains inpatient appropriate     Consultants:  none   Subjective: Has not been taking his eliquis  Objective: Vitals:   05/24/23 0442 05/24/23 0445 05/24/23 0600 05/24/23  0953  BP:  135/78 112/73 98/65  Pulse:  (!) 104 (!) 116 (!) 116  Resp:  (!) 21 (!) 26 (!) 22  Temp: 97.8 F (36.6 C)   97.9 F (36.6 C)  TempSrc: Oral   Oral  SpO2:  99% 98% 99%  Weight:      Height:       No intake or output data in the 24 hours ending 05/24/23 1126 Filed Weights   05/23/23 1435  Weight: 59.9 kg    Examination:   General: Appearance:    Thin male in no acute distress     Lungs:     respirations unlabored  Heart:    Tachycardic. Normal rhythm. No murmurs, rubs, or gallops.       Neurologic:   Awake, alert       Data Reviewed: I have personally reviewed following labs and imaging studies  CBC: Recent Labs  Lab 05/23/23 1502 05/24/23 0345  WBC 12.4* 11.9*  NEUTROABS 9.4*  --   HGB 11.6* 9.9*  HCT 34.1* 29.9*  MCV 93.2 93.7  PLT 432* 362   Basic Metabolic Panel: Recent Labs  Lab 05/23/23 1502 05/24/23 0345  NA 134* 129*  K 5.1 4.1  CL 106 105  CO2 22 17*  GLUCOSE 186* 163*  BUN 30* 27*  CREATININE 1.11 1.04  CALCIUM 8.9 8.4*  MG  --  1.5*   GFR:  Estimated Creatinine Clearance: 58.4 mL/min (by C-G formula based on SCr of 1.04 mg/dL). Liver Function Tests: Recent Labs  Lab 05/23/23 1502  AST 39  ALT 15  ALKPHOS 173*  BILITOT 1.0  PROT 5.4*  ALBUMIN 2.3*   No results for input(s): "LIPASE", "AMYLASE" in the last 168 hours. No results for input(s): "AMMONIA" in the last 168 hours. Coagulation Profile: Recent Labs  Lab 05/23/23 1502  INR 0.9   Cardiac Enzymes: No results for input(s): "CKTOTAL", "CKMB", "CKMBINDEX", "TROPONINI" in the last 168 hours. BNP (last 3 results) No results for input(s): "PROBNP" in the last 8760 hours. HbA1C: No results for input(s): "HGBA1C" in the last 72 hours. CBG: Recent Labs  Lab 05/23/23 2215 05/24/23 0748  GLUCAP 152* 140*   Lipid Profile: No results for input(s): "CHOL", "HDL", "LDLCALC", "TRIG", "CHOLHDL", "LDLDIRECT" in the last 72 hours. Thyroid Function Tests: No results  for input(s): "TSH", "T4TOTAL", "FREET4", "T3FREE", "THYROIDAB" in the last 72 hours. Anemia Panel: No results for input(s): "VITAMINB12", "FOLATE", "FERRITIN", "TIBC", "IRON", "RETICCTPCT" in the last 72 hours. Sepsis Labs: Recent Labs  Lab 05/23/23 1509 05/23/23 1640  LATICACIDVEN 1.8 0.9    Recent Results (from the past 240 hours)  Culture, blood (Routine x 2)     Status: None (Preliminary result)   Collection Time: 05/23/23  3:02 PM   Specimen: BLOOD RIGHT ARM  Result Value Ref Range Status   Specimen Description BLOOD RIGHT ARM  Final   Special Requests   Final    BOTTLES DRAWN AEROBIC AND ANAEROBIC Blood Culture adequate volume   Culture   Final    NO GROWTH < 24 HOURS Performed at [facility], [address] [place], [place] [address]    Report Status PENDING  Incomplete  Culture, blood (Routine x 2)     Status: None (Preliminary result)   Collection Time: 05/23/23  3:02 PM   Specimen: BLOOD RIGHT HAND  Result Value Ref Range Status   Specimen Description BLOOD RIGHT HAND  Final   Special Requests   Final    BOTTLES DRAWN AEROBIC AND ANAEROBIC Blood Culture results may not be optimal due to an inadequate volume of blood received in culture bottles   Culture   Final    NO GROWTH < 24 HOURS Performed at [facility], [address], [place], [place] [address]    Report Status PENDING  Incomplete  Resp panel by RT-PCR (RSV, Flu A&B, Covid) Anterior Nasal Swab     Status: None   Collection Time: 05/23/23  3:02 PM   Specimen: Anterior Nasal Swab  Result Value Ref Range Status   SARS Coronavirus 2 by RT PCR NEGATIVE NEGATIVE Final   Influenza A by PCR NEGATIVE NEGATIVE Final   Influenza B by PCR NEGATIVE NEGATIVE Final    Comment: (NOTE) The Xpert Xpress SARS-CoV-2/FLU/RSV plus assay is intended as an aid in the diagnosis of influenza from Nasopharyngeal swab specimens and should not be used as a sole basis for treatment. Nasal washings and aspirates are  unacceptable for Xpert Xpress SARS-CoV-2/FLU/RSV testing.  Fact Sheet for Patients: BloggerCourse.com  Fact Sheet for Healthcare Providers: SeriousBroker.it  This test is not yet approved or cleared by the [place] FDA and has been authorized for detection and/or diagnosis of SARS-CoV-2 by FDA under an Emergency Use Authorization (EUA). This EUA will remain in effect (meaning this test can be used) for the duration of the COVID-19 declaration under Section 564(b)(1) of the  Act, 21 U.S.C. section 360bbb-3(b)(1), unless the authorization is terminated or revoked.     Resp Syncytial Virus by PCR NEGATIVE NEGATIVE Final    Comment: (NOTE) Fact Sheet for Patients: BloggerCourse.com  Fact Sheet for Healthcare Providers: SeriousBroker.it  This test is not yet approved or cleared by the [place] FDA and has been authorized for detection and/or diagnosis of SARS-CoV-2 by FDA under an Emergency Use Authorization (EUA). This EUA will remain in effect (meaning this test can be used) for the duration of the COVID-19 declaration under Section 564(b)(1) of the Act, 21 U.S.C. section 360bbb-3(b)(1), unless the authorization is terminated or revoked.  Performed at [facility], [address] [place], [place] [address]          Radiology Studies: NM Pulmonary Perfusion Result Date: 05/23/2023 CLINICAL DATA:  Pulmonary embolism (PE) suspected, high prob Chest pain. EXAM: NUCLEAR MEDICINE PERFUSION LUNG SCAN TECHNIQUE: Perfusion images were obtained in multiple projections after intravenous injection of radiopharmaceutical. Ventilation scans intentionally deferred if perfusion scan and chest x-ray adequate for interpretation during COVID 19 epidemic. RADIOPHARMACEUTICALS:  4 mCi Tc-73m MAA IV COMPARISON:  Chest radiograph performed same day, perfusion scan 03/10/2023 FINDINGS:  Diminished perfusion in the right upper lobe, previous perfusion demonstrated segmental anomaly, currently appearing lobar. No abnormalities seen on concurrent chest radiograph. IMPRESSION: Diminished perfusion to the right upper lobe, which has progressed from prior perfusion scan. Findings may represent progressive pulmonary embolus since December. Electronically Signed   By: Narda Rutherford M.D.   On: 05/23/2023 19:29   DG Chest Portable 1 View Result Date: 05/23/2023 CLINICAL DATA:  Chest pain. EXAM: PORTABLE CHEST 1 VIEW COMPARISON:  03/09/2023, PET CT 11/28/2022 FINDINGS: Heart is normal in size. Stable mediastinal contours, similar right paratracheal thickening without adenopathy on prior PET. Pulmonary vasculature is normal. No consolidation, pleural effusion, or pneumothorax. Pulmonary nodules on prior PET are not seen on the current exam. On limited assessment, no acute osseous abnormalities are seen. IMPRESSION: 1. No acute chest findings. 2. Pulmonary nodules on prior PET are not seen on the current exam. Electronically Signed   By: Narda Rutherford M.D.   On: 05/23/2023 19:25        Scheduled Meds:  atorvastatin  40 mg Oral QHS   insulin aspart  0-5 Units Subcutaneous QHS   insulin aspart  0-6 Units Subcutaneous TID WC   pantoprazole  40 mg Oral Daily   sodium chloride flush  3 mL Intravenous Q12H   ticagrelor  90 mg Oral BID   Continuous Infusions:  heparin 1,100 Units/hr (05/23/23 1814)     LOS: 1 day    Time spent: 45 minutes spent on chart review, discussion with nursing staff, consultants, updating family and interview/physical exam; more than 50% of that time was spent in counseling and/or coordination of care.    Joseph Art, DO [facility] Available via Epic secure chat 7am-7pm After these hours, please refer to coverage provider listed on [email or web address] 05/24/2023, 11:26 AM

## 2023-05-24 NOTE — Plan of Care (Signed)

## 2023-05-24 NOTE — Progress Notes (Signed)
  Echocardiogram 2D Echocardiogram has been performed.  Martin Thomas 05/24/2023, 9:10 AM

## 2023-05-24 NOTE — Progress Notes (Signed)
 ANTICOAGULATION CONSULT NOTE  Pharmacy Consult for Heparin Indication: pulmonary embolus  Allergies  Allergen Reactions   Contrast Media [Iodinated Contrast Media] Anaphylaxis   Iodine Anaphylaxis   Sulfamethoxazole-Trimethoprim Nausea And Vomiting and Swelling    Other Reaction(s): Renal failure syndrome   Lisinopril Other (See Comments)    hyperkalemia  Other Reaction(s): Hyperkalemia   Varenicline     Other Reaction(s): Altered mental status   Ciprofloxacin Other (See Comments)    unknown   Metformin Other (See Comments)    Increased lactic acid  Other Reaction(s): Increased lactic acid level    Patient Measurements: Height: 5\' 9"  (175.3 cm) Weight: 56.3 kg (124 lb 1.9 oz) IBW/kg (Calculated) : 70.7 Heparin Dosing Weight: 59.9 kg  Vital Signs: Temp: 98.4 F (36.9 C) (03/08 1641) Temp Source: Oral (03/08 1641) BP: 116/70 (03/08 1641) Pulse Rate: 106 (03/08 1641)  Labs: Recent Labs    05/23/23 1502 05/23/23 1635 05/24/23 0345  HGB 11.6*  --  9.9*  HCT 34.1*  --  29.9*  PLT 432*  --  362  APTT 30  --   --   LABPROT 12.8  --   --   INR 0.9  --   --   HEPARINUNFRC <0.10*  --  0.40  CREATININE 1.11  --  1.04  TROPONINIHS 35* 29*  --     Estimated Creatinine Clearance: 54.9 mL/min (by C-G formula based on SCr of 1.04 mg/dL).   Medical History: Past Medical History:  Diagnosis Date   Cancer (HCC)    lung cancer   COPD (chronic obstructive pulmonary disease) (HCC)    Diabetes mellitus without complication (HCC)    Hyperlipemia    Hypertension     Medications:  Medications Prior to Admission  Medication Sig Dispense Refill Last Dose/Taking   albuterol (PROVENTIL HFA;VENTOLIN HFA) 108 (90 BASE) MCG/ACT inhaler Inhale 1 puff into the lungs 4 (four) times daily as needed for shortness of breath.   Taking As Needed   aspirin EC 81 MG tablet Take 81 mg by mouth daily. Swallow whole.   05/22/2023 Morning   atorvastatin (LIPITOR) 80 MG tablet Take 1 tablet  (80 mg total) by mouth daily. (Patient taking differently: Take 40 mg by mouth at bedtime.) 30 tablet 3 05/22/2023 Bedtime   colchicine 0.6 MG tablet Take 1 tablet (0.6 mg total) by mouth 2 (two) times daily. 180 tablet 0 05/22/2023   cyclobenzaprine (FLEXERIL) 10 MG tablet Take 10 mg by mouth at bedtime.   05/22/2023 Bedtime   empagliflozin (JARDIANCE) 25 MG TABS tablet Take 12.5 mg by mouth daily.   Taking   fenofibrate (TRICOR) 145 MG tablet Take 145 mg by mouth daily.   05/22/2023 Morning   furosemide (LASIX) 40 MG tablet Take 40 mg by mouth daily.   05/22/2023 Morning   gabapentin (NEURONTIN) 300 MG capsule Take 300 mg by mouth 2 (two) times daily.   05/22/2023   glucose 4 GM chewable tablet Chew 4 tablets by mouth as needed for low blood sugar (blood sugar less than 70).   Taking As Needed   ketoconazole (NIZORAL) 2 % cream Apply 1 Application topically daily.   Taking   losartan (COZAAR) 25 MG tablet Take 25 mg by mouth daily.   05/22/2023 Morning   magnesium oxide (MAG-OX) 400 (240 Mg) MG tablet Take 400 mg by mouth daily.   05/22/2023   metoprolol tartrate (LOPRESSOR) 100 MG tablet Take 50 mg by mouth in the morning.   05/22/2023 Morning  mirtazapine (REMERON) 30 MG tablet Take 30 mg by mouth at bedtime.   05/22/2023   omeprazole (PRILOSEC) 40 MG capsule Take 40 mg by mouth daily.   05/22/2023 Morning   ticagrelor (BRILINTA) 90 MG TABS tablet Take 1 tablet (90 mg total) by mouth 2 (two) times daily. 60 tablet 3 05/22/2023   apixaban (ELIQUIS) 5 MG TABS tablet Take 2 tablets (10 mg total) by mouth 2 (two) times daily for 3 days, THEN 1 tablet (5 mg total) 2 (two) times daily for 27 days. (Patient not taking: Reported on 05/23/2023) 66 tablet 0 Not Taking   Blood Glucose Monitoring Suppl (BLOOD GLUCOSE MONITOR SYSTEM) w/Device KIT Use in the morning, at noon, and at bedtime. 1 kit 0    budesonide-formoterol (SYMBICORT) 160-4.5 MCG/ACT inhaler Inhale 2 puffs into the lungs 2 (two) times daily. (Patient not taking:  Reported on 03/09/2023)   Not Taking   Glucose Blood (BLOOD GLUCOSE TEST STRIPS) STRP Use in the morning, at noon, and at bedtime. 100 strip 0    HYDROcodone-acetaminophen (NORCO/VICODIN) 5-325 MG per tablet Take 1 tablet by mouth every 8 (eight) hours as needed. For pain (Patient not taking: Reported on 05/23/2023)   Not Taking   insulin aspart (NOVOLOG) 100 UNIT/ML FlexPen Inject 12 Units into the skin 3 (three) times daily with meals. (Patient not taking: Reported on 05/23/2023) 15 mL 0 Not Taking   insulin glargine-yfgn (SEMGLEE) 100 UNIT/ML Pen Inject 5 Units into the skin at bedtime. Reassess to go up on home dose which is 32 u slowly with PCP (Patient not taking: Reported on 05/23/2023)   Not Taking   Insulin Pen Needle 32G X 4 MM MISC Use 4 (four) times daily -  before meals and at bedtime. 100 each 0    metoprolol succinate (TOPROL-XL) 25 MG 24 hr tablet Take 1 tablet (25 mg total) by mouth at bedtime. 30 tablet 0    nitroGLYCERIN (NITROSTAT) 0.4 MG SL tablet Place 1 tablet (0.4 mg total) under the tongue every 5 (five) minutes as needed for chest pain. 25 tablet 1    rosuvastatin (CRESTOR) 40 MG tablet Take 20 mg by mouth daily. (Patient not taking: Reported on 05/23/2023)   Not Taking   Study - EVOLVE-MI - evolocumab (REPATHA) 140 mg/mL SQ injection (PI-Stuckey) Inject 1 mL (140 mg total) into the skin every 14 (fourteen) days. For Investigational Use Only. Inject subcutaneously into abdomen, thigh, or upper arm every 14 days. Rotate injection sites and do not inject into areas where skin is tender, bruised, or red. Please contact [facility] for any questions or concerns regarding this medication. (Patient not taking: Reported on 03/10/2023) 14 mL 0 Not Taking   Scheduled:   atorvastatin  40 mg Oral QHS   insulin aspart  0-5 Units Subcutaneous QHS   insulin aspart  0-6 Units Subcutaneous TID WC   pantoprazole  40 mg Oral Daily   sodium chloride flush  3 mL Intravenous Q12H   ticagrelor   90 mg Oral BID   Infusions:   heparin 1,100 Units/hr (05/24/23 1302)   PRN: acetaminophen **OR** acetaminophen, oxyCODONE, polyethylene glycol  Assessment: 68 yom with a history of PE, COPD, hypertension, hyperlipidemia, and 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 Patient is presenting with chest pain. Heparin per pharmacy consult placed for pulmonary embolus.  Provider note mentions on eliquis prior to arrival. Patient does not recall being on this and is not on his med list. EPIC is showing 30  day supply filled 03/17/23.  Baseline heparin level is <0.1. Would suggest has not been taking anti-Xa anticoagulant. aPTT 30 Hgb 11.6; plt 432  Heparin level therapeutic earlier today on 1100 units/hr.  Patient is refusing labs this afternoon for confirmation heparin level.  Will continue current rate and check labs in AM.  Goal of Therapy:  Heparin level 0.3-0.7 units/ml Monitor platelets by anticoagulation protocol: Yes   Plan:  Continue heparin infusion at 1100 units/hr Check anti-Xa level daily while on heparin Continue to monitor H&H and platelets   [employer], Pharm.D., BCPS Clinical Pharmacist  **Pharmacist phone directory can be found on [email or web address] listed under [facility].  05/24/2023 5:06 PM

## 2023-05-25 DIAGNOSIS — I2699 Other pulmonary embolism without acute cor pulmonale: Secondary | ICD-10-CM | POA: Diagnosis not present

## 2023-05-25 DIAGNOSIS — R000 Tachycardia, unspecified: Secondary | ICD-10-CM | POA: Diagnosis not present

## 2023-05-25 LAB — BASIC METABOLIC PANEL
Anion gap: 9 (ref 5–15)
BUN: 23 mg/dL (ref 8–23)
CO2: 15 mmol/L — ABNORMAL LOW (ref 22–32)
Calcium: 8.5 mg/dL — ABNORMAL LOW (ref 8.9–10.3)
Chloride: 104 mmol/L (ref 98–111)
Creatinine, Ser: 1.06 mg/dL (ref 0.61–1.24)
GFR, Estimated: >60 mL/min (ref >60)
Glucose, Bld: 148 mg/dL — ABNORMAL HIGH (ref 70–99)
Potassium: 5.2 mmol/L — ABNORMAL HIGH (ref 3.5–5.1)
Sodium: 128 mmol/L — ABNORMAL LOW (ref 135–145)

## 2023-05-25 LAB — CBC
HCT: 30.7 % — ABNORMAL LOW (ref 39.0–52.0)
Hemoglobin: 10.3 g/dL — ABNORMAL LOW (ref 13.0–17.0)
MCH: 31 pg (ref 26.0–34.0)
MCHC: 33.6 g/dL (ref 30.0–36.0)
MCV: 92.5 fL (ref 80.0–100.0)
Platelets: 364 10*3/uL (ref 150–400)
RBC: 3.32 MIL/uL — ABNORMAL LOW (ref 4.22–5.81)
RDW: 15.6 % — ABNORMAL HIGH (ref 11.5–15.5)
WBC: 9.4 10*3/uL (ref 4.0–10.5)
nRBC: 0 % (ref 0.0–0.2)

## 2023-05-25 LAB — GLUCOSE, CAPILLARY
Glucose-Capillary: 136 mg/dL — ABNORMAL HIGH (ref 70–99)
Glucose-Capillary: 288 mg/dL — ABNORMAL HIGH (ref 70–99)
Glucose-Capillary: 167 mg/dL — ABNORMAL HIGH (ref 70–99)
Glucose-Capillary: 192 mg/dL — ABNORMAL HIGH (ref 70–99)

## 2023-05-25 LAB — HEPARIN LEVEL (UNFRACTIONATED)
Heparin Unfractionated: 0.27 [IU]/mL — ABNORMAL LOW (ref 0.30–0.70)
Heparin Unfractionated: 0.46 [IU]/mL (ref 0.30–0.70)

## 2023-05-25 MED ORDER — GABAPENTIN 300 MG PO CAPS
300.0000 mg | ORAL_CAPSULE | Freq: Two times a day (BID) | ORAL | Status: DC
Start: 2023-05-25 — End: 2023-05-29
  Administered 2023-05-25 – 2023-05-29 (×9): 300 mg via ORAL
  Filled 2023-05-25 (×9): qty 1

## 2023-05-25 MED ORDER — METOPROLOL TARTRATE 12.5 MG HALF TABLET
12.5000 mg | ORAL_TABLET | Freq: Two times a day (BID) | ORAL | Status: DC
  Administered 2023-05-25 – 2023-05-29 (×9): 12.5 mg via ORAL
  Filled 2023-05-25 (×9): qty 1

## 2023-05-25 MED ORDER — FENOFIBRATE 160 MG PO TABS
160.0000 mg | ORAL_TABLET | Freq: Every day | ORAL | Status: DC
  Administered 2023-05-25 – 2023-05-29 (×5): 160 mg via ORAL
  Filled 2023-05-25 (×5): qty 1

## 2023-05-25 MED ORDER — MIRTAZAPINE 15 MG PO TABS
30.0000 mg | ORAL_TABLET | Freq: Every day | ORAL | Status: DC
  Administered 2023-05-25 – 2023-05-28 (×4): 30 mg via ORAL
  Filled 2023-05-25 (×4): qty 2

## 2023-05-25 NOTE — Plan of Care (Signed)

## 2023-05-25 NOTE — Progress Notes (Signed)
 PROGRESS NOTE    [name]  HQI:[id number] DOB: 1955/06/28 DOA: 05/23/2023 PCP: Default, Provider, MD    Brief Narrative:  Martin Thomas is a 68 y.o. male with medical history significant for hypertension, hyperlipidemia, type 2 diabetes mellitus, lung cancer, COPD, CAD, PE in December 2024 on Eliquis, cognitive impairment, and poor adherence with his medications, now presenting with chest pain.   Patient reports waking at approximately 1 PM today with chest pressure.  He describes this as "9/10" in severity, constant, localized to the central chest, and unchanged with nitroglycerin.  He denies shortness of breath or change in his chronic cough.     Patient manages his own medications.  He is not aware that he was supposed to be on a blood thinner and does not believe that he is taking one.    Assessment and Plan:  Pulmonary embolism  -has not been taking his elquis at home-- unclear how long -v/q done with increased PE - Continue IV heparin for now, continue cardiac monitoring -echo shows similar EF-- 35-40%    CAD; elevated troponin   - Troponin minimally elevated and decreasing in setting of acute PE    - Continue Lipitor, Brilinta  -add back low dose BB   Hypertension  - SBP 90s in ED and antihypertensives held on admission      Type II DM  - A1c was 8.2% in December 2024  - SSI   Chronic HFrEF  - EF was 35-40% on TTE from December 2024  - echo pending   COPD  - Not in exacerbation  - Continue ICS-LABA and as-needed albuterol      Cognitive impairment  - Delirium precautions  - Needs help with managing his medications at home, TOC consulted    Hypomagnesemia -replete  DVT prophylaxis:     Code Status: Full Code   Disposition Plan:  Level of care: Progressive Status is: Inpatient Remains inpatient appropriate     Consultants:  none   Subjective: Has not been taking his eliquis  Objective: Vitals:   05/25/23 0108 05/25/23 0506 05/25/23  0814 05/25/23 1151  BP: 105/60 107/77 116/69 104/66  Pulse: (!) 111 (!) 116 (!) 102 (!) 130  Resp: 20 18 18 18   Temp: 98.9 F (37.2 C) 98.8 F (37.1 C) 98.6 F (37 C) 97.8 F (36.6 C)  TempSrc: Oral Oral Oral Oral  SpO2: 96% 97% 99% 97%  Weight:      Height:        Intake/Output Summary (Last 24 hours) at 05/25/2023 1214 Last data filed at 05/25/2023 1152 Gross per 24 hour  Intake 1179.54 ml  Output 3180 ml  Net -2000.46 ml   Filed Weights   05/23/23 1435 05/24/23 1343  Weight: 59.9 kg 56.3 kg    Examination:   General: Appearance:    Thin male in no acute distress     Lungs:     respirations unlabored  Heart:    Tachycardic. Normal rhythm. No murmurs, rubs, or gallops.       Neurologic:   Awake, alert       Data Reviewed: I have personally reviewed following labs and imaging studies  CBC: Recent Labs  Lab 05/23/23 1502 05/24/23 0345  WBC 12.4* 11.9*  NEUTROABS 9.4*  --   HGB 11.6* 9.9*  HCT 34.1* 29.9*  MCV 93.2 93.7  PLT 432* 362   Basic Metabolic Panel: Recent Labs  Lab 05/23/23 1502 05/24/23 0345 05/25/23 0559  NA 134* 129* 128*  K 5.1 4.1 5.2*  CL 106 105 104  CO2 22 17* 15*  GLUCOSE 186* 163* 148*  BUN 30* 27* 23  CREATININE 1.11 1.04 1.06  CALCIUM 8.9 8.4* 8.5*  MG  --  1.5*  --    GFR: Estimated Creatinine Clearance: 53.9 mL/min (by C-G formula based on SCr of 1.06 mg/dL). Liver Function Tests: Recent Labs  Lab 05/23/23 1502  AST 39  ALT 15  ALKPHOS 173*  BILITOT 1.0  PROT 5.4*  ALBUMIN 2.3*   No results for input(s): "LIPASE", "AMYLASE" in the last 168 hours. No results for input(s): "AMMONIA" in the last 168 hours. Coagulation Profile: Recent Labs  Lab 05/23/23 1502  INR 0.9   Cardiac Enzymes: No results for input(s): "CKTOTAL", "CKMB", "CKMBINDEX", "TROPONINI" in the last 168 hours. BNP (last 3 results) No results for input(s): "PROBNP" in the last 8760 hours. HbA1C: No results for input(s): "HGBA1C" in the last  72 hours. CBG: Recent Labs  Lab 05/24/23 1225 05/24/23 1854 05/24/23 2131 05/25/23 0812 05/25/23 1149  GLUCAP 255* 187* 205* 136* 288*   Lipid Profile: No results for input(s): "CHOL", "HDL", "LDLCALC", "TRIG", "CHOLHDL", "LDLDIRECT" in the last 72 hours. Thyroid Function Tests: No results for input(s): "TSH", "T4TOTAL", "FREET4", "T3FREE", "THYROIDAB" in the last 72 hours. Anemia Panel: No results for input(s): "VITAMINB12", "FOLATE", "FERRITIN", "TIBC", "IRON", "RETICCTPCT" in the last 72 hours. Sepsis Labs: Recent Labs  Lab 05/23/23 1509 05/23/23 1640  LATICACIDVEN 1.8 0.9    Recent Results (from the past 240 hours)  Culture, blood (Routine x 2)     Status: None (Preliminary result)   Collection Time: 05/23/23  3:02 PM   Specimen: BLOOD RIGHT ARM  Result Value Ref Range Status   Specimen Description BLOOD RIGHT ARM  Final   Special Requests   Final    BOTTLES DRAWN AEROBIC AND ANAEROBIC Blood Culture adequate volume   Culture   Final    NO GROWTH 2 DAYS Performed at [facility], [address] [place], [place] [address]    Report Status PENDING  Incomplete  Culture, blood (Routine x 2)     Status: None (Preliminary result)   Collection Time: 05/23/23  3:02 PM   Specimen: BLOOD RIGHT HAND  Result Value Ref Range Status   Specimen Description BLOOD RIGHT HAND  Final   Special Requests   Final    BOTTLES DRAWN AEROBIC AND ANAEROBIC Blood Culture results may not be optimal due to an inadequate volume of blood received in culture bottles   Culture   Final    NO GROWTH 2 DAYS Performed at [facility], [address], [place], [place] [address]    Report Status PENDING  Incomplete  Resp panel by RT-PCR (RSV, Flu A&B, Covid) Anterior Nasal Swab     Status: None   Collection Time: 05/23/23  3:02 PM   Specimen: Anterior Nasal Swab  Result Value Ref Range Status   SARS Coronavirus 2 by RT PCR NEGATIVE NEGATIVE Final   Influenza A by PCR NEGATIVE NEGATIVE  Final   Influenza B by PCR NEGATIVE NEGATIVE Final    Comment: (NOTE) The Xpert Xpress SARS-CoV-2/FLU/RSV plus assay is intended as an aid in the diagnosis of influenza from Nasopharyngeal swab specimens and should not be used as a sole basis for treatment. Nasal washings and aspirates are unacceptable for Xpert Xpress SARS-CoV-2/FLU/RSV testing.  Fact Sheet for Patients: BloggerCourse.com  Fact Sheet for Healthcare Providers: SeriousBroker.it  This test  is not yet approved or cleared by the [place] and has been authorized for detection and/or diagnosis of SARS-CoV-2 by FDA under an Emergency Use Authorization (EUA). This EUA will remain in effect (meaning this test can be used) for the duration of the COVID-19 declaration under Section 564(b)(1) of the Act, 21 U.S.C. section 360bbb-3(b)(1), unless the authorization is terminated or revoked.     Resp Syncytial Virus by PCR NEGATIVE NEGATIVE Final    Comment: (NOTE) Fact Sheet for Patients: BloggerCourse.com  Fact Sheet for Healthcare Providers: SeriousBroker.it  This test is not yet approved or cleared by the [place] FDA and has been authorized for detection and/or diagnosis of SARS-CoV-2 by FDA under an Emergency Use Authorization (EUA). This EUA will remain in effect (meaning this test can be used) for the duration of the COVID-19 declaration under Section 564(b)(1) of the Act, 21 U.S.C. section 360bbb-3(b)(1), unless the authorization is terminated or revoked.  Performed at [facility], [address], [place], [place] [address]          Radiology Studies: ECHOCARDIOGRAM COMPLETE Result Date: 05/24/2023    ECHOCARDIOGRAM REPORT   Patient Name:   Martin Thomas Date of Exam: 05/24/2023 Medical Rec #:  [id number]        Height:       69.0 in Accession #:    [id number]       Weight:       132.1 lb  Date of Birth:  Nov 07, 1955         BSA:          1.732 m Patient Age:    68 years         BP:           112/73 mmHg Patient Gender: M                HR:           118 bpm. Exam Location:  Inpatient Procedure: 2D Echo, Cardiac Doppler and Color Doppler (Both Spectral and Color            Flow Doppler were utilized during procedure). Indications:    Pulmonary Embolus  History:        Patient has prior history of Echocardiogram examinations, most                 recent 03/10/2023. Previous Myocardial Infarction and CAD; Risk                 Factors:Hypertension, Diabetes and Current Smoker.  Sonographer:    Rosaland Lao Sonographer#2:  Delcie Roch RDCS Referring Phys: [id number] TIMOTHY S OPYD IMPRESSIONS  1. Left ventricular ejection fraction, by estimation, is 35 to 40%. The left ventricle has moderately decreased function. The left ventricle has no regional wall motion abnormalities. The left ventricular internal cavity size was moderately dilated. Left ventricular diastolic parameters are indeterminate.  2. Right ventricular systolic function is normal. The right ventricular size is normal.  3. The mitral valve is abnormal. Mild mitral valve regurgitation. No evidence of mitral stenosis.  4. Mean gradient 10 mmhg but AVA 2.2 cm2 and DVI 0.63. The aortic valve was not well visualized. There is moderate calcification of the aortic valve. There is moderate thickening of the aortic valve. Aortic valve regurgitation is mild to moderate. Aortic valve sclerosis/calcification is present, without any evidence of aortic stenosis.  5. The inferior vena cava is normal in size with greater than 50% respiratory variability, suggesting  right atrial pressure of 3 mmHg. FINDINGS  Left Ventricle: Left ventricular ejection fraction, by estimation, is 35 to 40%. The left ventricle has moderately decreased function. The left ventricle has no regional wall motion abnormalities. Strain was performed and the global longitudinal strain  is indeterminate. The left ventricular internal cavity size was moderately dilated. There is no left ventricular hypertrophy. Left ventricular diastolic parameters are indeterminate. Right Ventricle: The right ventricular size is normal. No increase in right ventricular wall thickness. Right ventricular systolic function is normal. Left Atrium: Left atrial size was normal in size. Right Atrium: Right atrial size was normal in size. Pericardium: There is no evidence of pericardial effusion. Mitral Valve: The mitral valve is abnormal. There is mild thickening of the mitral valve leaflet(s). There is mild calcification of the mitral valve leaflet(s). Mild mitral annular calcification. Mild mitral valve regurgitation. No evidence of mitral valve stenosis. Tricuspid Valve: The tricuspid valve is normal in structure. Tricuspid valve regurgitation is mild . No evidence of tricuspid stenosis. Aortic Valve: Mean gradient 10 mmhg but AVA 2.2 cm2 and DVI 0.63. The aortic valve was not well visualized. There is moderate calcification of the aortic valve. There is moderate thickening of the aortic valve. Aortic valve regurgitation is mild to moderate. Aortic regurgitation PHT measures 309 msec. Aortic valve sclerosis/calcification is present, without any evidence of aortic stenosis. Aortic valve mean gradient measures 10.0 mmHg. Aortic valve peak gradient measures 19.2 mmHg. Aortic valve area, by VTI measures 2.18 cm. Pulmonic Valve: The pulmonic valve was normal in structure. Pulmonic valve regurgitation is mild. No evidence of pulmonic stenosis. Aorta: The aortic root is normal in size and structure. Venous: The inferior vena cava is normal in size with greater than 50% respiratory variability, suggesting right atrial pressure of 3 mmHg. IAS/Shunts: No atrial level shunt detected by color flow Doppler. Additional Comments: 3D was performed not requiring image post processing on an independent workstation and was  indeterminate.  LEFT VENTRICLE PLAX 2D LVIDd:         5.00 cm      Diastology LVIDs:         4.00 cm      LV e' medial:    17.20 cm/s LV PW:         1.00 cm      LV E/e' medial:  9.2 LV IVS:        0.80 cm      LV e' lateral:   23.40 cm/s LVOT diam:     2.10 cm      LV E/e' lateral: 6.8 LV SV:         74 LV SV Index:   43 LVOT Area:     3.46 cm  LV Volumes (MOD) LV vol d, MOD A2C: 89.3 ml LV vol d, MOD A4C: 110.0 ml LV vol s, MOD A2C: 58.9 ml LV vol s, MOD A4C: 69.8 ml LV SV MOD A2C:     30.4 ml LV SV MOD A4C:     110.0 ml LV SV MOD BP:      34.7 ml RIGHT VENTRICLE RV Basal diam:  2.70 cm RV S prime:     13.50 cm/s TAPSE (M-mode): 1.0 cm LEFT ATRIUM             Index        RIGHT ATRIUM           Index LA diam:        3.50 cm 2.02 cm/m  RA Area:     11.80 cm LA Vol (A2C):   33.4 ml 19.29 ml/m  RA Volume:   23.30 ml  13.45 ml/m LA Vol (A4C):   29.1 ml 16.80 ml/m LA Biplane Vol: 31.4 ml 18.13 ml/m  AORTIC VALVE AV Area (Vmax):    2.23 cm AV Area (Vmean):   2.19 cm AV Area (VTI):     2.18 cm AV Vmax:           219.00 cm/s AV Vmean:          152.000 cm/s AV VTI:            0.341 m AV Peak Grad:      19.2 mmHg AV Mean Grad:      10.0 mmHg LVOT Vmax:         141.00 cm/s LVOT Vmean:        96.300 cm/s LVOT VTI:          0.215 m LVOT/AV VTI ratio: 0.63 AI PHT:            309 msec  AORTA Ao Root diam: 3.70 cm Ao Asc diam:  3.20 cm MR Peak grad: 74.6 mmHg MR Vmax:      432.00 cm/s   SHUNTS MV E velocity: 158.00 cm/s  Systemic VTI:  0.22 m                             Systemic Diam: 2.10 cm Charlton Haws MD Electronically signed by Charlton Haws MD Signature Date/Time: 05/24/2023/11:29:45 AM    Final    NM Pulmonary Perfusion Result Date: 05/23/2023 CLINICAL DATA:  Pulmonary embolism (PE) suspected, high prob Chest pain. EXAM: NUCLEAR MEDICINE PERFUSION LUNG SCAN TECHNIQUE: Perfusion images were obtained in multiple projections after intravenous injection of radiopharmaceutical. Ventilation scans intentionally deferred  if perfusion scan and chest x-ray adequate for interpretation during COVID 19 epidemic. RADIOPHARMACEUTICALS:  4 mCi Tc-10m MAA IV COMPARISON:  Chest radiograph performed same day, perfusion scan 03/10/2023 FINDINGS: Diminished perfusion in the right upper lobe, previous perfusion demonstrated segmental anomaly, currently appearing lobar. No abnormalities seen on concurrent chest radiograph. IMPRESSION: Diminished perfusion to the right upper lobe, which has progressed from prior perfusion scan. Findings may represent progressive pulmonary embolus since December. Electronically Signed   By: Narda Rutherford M.D.   On: 05/23/2023 19:29   DG Chest Portable 1 View Result Date: 05/23/2023 CLINICAL DATA:  Chest pain. EXAM: PORTABLE CHEST 1 VIEW COMPARISON:  03/09/2023, PET CT 11/28/2022 FINDINGS: Heart is normal in size. Stable mediastinal contours, similar right paratracheal thickening without adenopathy on prior PET. Pulmonary vasculature is normal. No consolidation, pleural effusion, or pneumothorax. Pulmonary nodules on prior PET are not seen on the current exam. On limited assessment, no acute osseous abnormalities are seen. IMPRESSION: 1. No acute chest findings. 2. Pulmonary nodules on prior PET are not seen on the current exam. Electronically Signed   By: Narda Rutherford M.D.   On: 05/23/2023 19:25        Scheduled Meds:  atorvastatin  40 mg Oral QHS   insulin aspart  0-5 Units Subcutaneous QHS   insulin aspart  0-6 Units Subcutaneous TID WC   pantoprazole  40 mg Oral Daily   sodium chloride flush  3 mL Intravenous Q12H   ticagrelor  90 mg Oral BID   Continuous Infusions:  heparin 1,100 Units/hr (05/24/23 2109)     LOS: 2 days  Time spent: 45 minutes spent on chart review, discussion with nursing staff, consultants, updating family and interview/physical exam; more than 50% of that time was spent in counseling and/or coordination of care.    Joseph Art, DO [facility] Available via Epic secure chat 7am-7pm After these hours, please refer to coverage provider listed on [email or web address] 05/25/2023, 12:14 PM

## 2023-05-25 NOTE — Progress Notes (Signed)
 ANTICOAGULATION CONSULT NOTE  Pharmacy Consult for Heparin Indication: pulmonary embolus  Allergies  Allergen Reactions   Contrast Media [Iodinated Contrast Media] Anaphylaxis   Iodine Anaphylaxis   Sulfamethoxazole-Trimethoprim Nausea And Vomiting and Swelling    Other Reaction(s): Renal failure syndrome   Lisinopril Other (See Comments)    hyperkalemia  Other Reaction(s): Hyperkalemia   Varenicline     Other Reaction(s): Altered mental status   Ciprofloxacin Other (See Comments)    unknown   Metformin Other (See Comments)    Increased lactic acid  Other Reaction(s): Increased lactic acid level    Patient Measurements: Height: 5\' 9"  (175.3 cm) Weight: 56.3 kg (124 lb 1.9 oz) IBW/kg (Calculated) : 70.7 Heparin Dosing Weight: 59.9 kg  Vital Signs: Temp: 97.8 F (36.6 C) (03/09 1151) Temp Source: Oral (03/09 1151) BP: 104/66 (03/09 1151) Pulse Rate: 130 (03/09 1151)  Labs: Recent Labs    05/23/23 1502 05/23/23 1635 05/24/23 0345 05/24/23 1739 05/25/23 0559 05/25/23 1152  HGB 11.6*  --  9.9*  --   --  10.3*  HCT 34.1*  --  29.9*  --   --  30.7*  PLT 432*  --  362  --   --  364  APTT 30  --   --   --   --   --   LABPROT 12.8  --   --   --   --   --   INR 0.9  --   --   --   --   --   HEPARINUNFRC <0.10*  --  0.40 0.32  --  0.27*  CREATININE 1.11  --  1.04  --  1.06  --   TROPONINIHS 35* 29*  --   --   --   --     Estimated Creatinine Clearance: 53.9 mL/min (by C-G formula based on SCr of 1.06 mg/dL).   Medical History: Past Medical History:  Diagnosis Date   Cancer (HCC)    lung cancer   COPD (chronic obstructive pulmonary disease) (HCC)    Diabetes mellitus without complication (HCC)    Hyperlipemia    Hypertension     Medications:  Medications Prior to Admission  Medication Sig Dispense Refill Last Dose/Taking   albuterol (PROVENTIL HFA;VENTOLIN HFA) 108 (90 BASE) MCG/ACT inhaler Inhale 1 puff into the lungs 4 (four) times daily as needed for  shortness of breath.   Taking As Needed   aspirin EC 81 MG tablet Take 81 mg by mouth daily. Swallow whole.   05/22/2023 Morning   atorvastatin (LIPITOR) 80 MG tablet Take 1 tablet (80 mg total) by mouth daily. (Patient taking differently: Take 40 mg by mouth at bedtime.) 30 tablet 3 05/22/2023 Bedtime   colchicine 0.6 MG tablet Take 1 tablet (0.6 mg total) by mouth 2 (two) times daily. 180 tablet 0 05/22/2023   cyclobenzaprine (FLEXERIL) 10 MG tablet Take 10 mg by mouth at bedtime.   05/22/2023 Bedtime   empagliflozin (JARDIANCE) 25 MG TABS tablet Take 12.5 mg by mouth daily.   Taking   fenofibrate (TRICOR) 145 MG tablet Take 145 mg by mouth daily.   05/22/2023 Morning   furosemide (LASIX) 40 MG tablet Take 40 mg by mouth daily.   05/22/2023 Morning   gabapentin (NEURONTIN) 300 MG capsule Take 300 mg by mouth 2 (two) times daily.   05/22/2023   glucose 4 GM chewable tablet Chew 4 tablets by mouth as needed for low blood sugar (blood sugar less than 70).  Taking As Needed   ketoconazole (NIZORAL) 2 % cream Apply 1 Application topically daily.   Taking   losartan (COZAAR) 25 MG tablet Take 25 mg by mouth daily.   05/22/2023 Morning   magnesium oxide (MAG-OX) 400 (240 Mg) MG tablet Take 400 mg by mouth daily.   05/22/2023   mirtazapine (REMERON) 30 MG tablet Take 30 mg by mouth at bedtime.   05/22/2023   omeprazole (PRILOSEC) 40 MG capsule Take 40 mg by mouth daily.   05/22/2023 Morning   ticagrelor (BRILINTA) 90 MG TABS tablet Take 1 tablet (90 mg total) by mouth 2 (two) times daily. 60 tablet 3 05/22/2023   apixaban (ELIQUIS) 5 MG TABS tablet Take 2 tablets (10 mg total) by mouth 2 (two) times daily for 3 days, THEN 1 tablet (5 mg total) 2 (two) times daily for 27 days. (Patient not taking: Reported on 05/23/2023) 66 tablet 0 Not Taking   Blood Glucose Monitoring Suppl (BLOOD GLUCOSE MONITOR SYSTEM) w/Device KIT Use in the morning, at noon, and at bedtime. 1 kit 0    budesonide-formoterol (SYMBICORT) 160-4.5 MCG/ACT  inhaler Inhale 2 puffs into the lungs 2 (two) times daily. (Patient not taking: Reported on 03/09/2023)   Not Taking   Glucose Blood (BLOOD GLUCOSE TEST STRIPS) STRP Use in the morning, at noon, and at bedtime. 100 strip 0    HYDROcodone-acetaminophen (NORCO/VICODIN) 5-325 MG per tablet Take 1 tablet by mouth every 8 (eight) hours as needed. For pain (Patient not taking: Reported on 05/23/2023)   Not Taking   insulin aspart (NOVOLOG) 100 UNIT/ML FlexPen Inject 12 Units into the skin 3 (three) times daily with meals. (Patient not taking: Reported on 05/23/2023) 15 mL 0 Not Taking   insulin glargine-yfgn (SEMGLEE) 100 UNIT/ML Pen Inject 5 Units into the skin at bedtime. Reassess to go up on home dose which is 32 u slowly with PCP (Patient not taking: Reported on 05/23/2023)   Not Taking   Insulin Pen Needle 32G X 4 MM MISC Use 4 (four) times daily -  before meals and at bedtime. 100 each 0    metoprolol succinate (TOPROL-XL) 25 MG 24 hr tablet Take 1 tablet (25 mg total) by mouth at bedtime. 30 tablet 0    nitroGLYCERIN (NITROSTAT) 0.4 MG SL tablet Place 1 tablet (0.4 mg total) under the tongue every 5 (five) minutes as needed for chest pain. 25 tablet 1    rosuvastatin (CRESTOR) 40 MG tablet Take 20 mg by mouth daily. (Patient not taking: Reported on 05/23/2023)   Not Taking   Study - EVOLVE-MI - evolocumab (REPATHA) 140 mg/mL SQ injection (PI-Stuckey) Inject 1 mL (140 mg total) into the skin every 14 (fourteen) days. For Investigational Use Only. Inject subcutaneously into abdomen, thigh, or upper arm every 14 days. Rotate injection sites and do not inject into areas where skin is tender, bruised, or red. Please contact [facility] for any questions or concerns regarding this medication. (Patient not taking: Reported on 03/10/2023) 14 mL 0 Not Taking   Scheduled:   atorvastatin  40 mg Oral QHS   fenofibrate  160 mg Oral Daily   gabapentin  300 mg Oral BID   insulin aspart  0-5 Units Subcutaneous QHS    insulin aspart  0-6 Units Subcutaneous TID WC   metoprolol tartrate  12.5 mg Oral BID   mirtazapine  30 mg Oral QHS   pantoprazole  40 mg Oral Daily   sodium chloride flush  3 mL Intravenous  Q12H   ticagrelor  90 mg Oral BID   Infusions:   heparin 1,100 Units/hr (05/24/23 2109)   PRN: acetaminophen **OR** acetaminophen, guaiFENesin, menthol-cetylpyridinium, oxyCODONE, polyethylene glycol  Assessment: 68 yom with a history of PE, COPD, hypertension, hyperlipidemia, and 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 Patient is presenting with chest pain. Heparin per pharmacy consult placed for pulmonary embolus.  Provider note mentions on eliquis prior to arrival. Patient does not recall being on this and is not on his med list. EPIC is showing 30 day supply filled 03/17/23. Baseline heparin level was <0.1, suggesting he has not been taking anti-Xa anticoagulant.  3/9 update: Heparin level obtained 3/8 at 17:39 and was therapeutic at 0.32 on 1100 units/hr. 3/9 AM heparin level was delayed due to patient initially refusing lab draw. Heparin level is now subtherapeutic at 0.27 on 1100 units/hr. Hgb stable at 10.3, PLT stable at 364. No issues with infusion and no new bleeding noted per RN.    Goal of Therapy:  Heparin level 0.3-0.7 units/ml Monitor platelets by anticoagulation protocol: Yes   Plan:  Increase heparin infusion to 1150 units/hr Obtain 8 hour heparin level Check anti-Xa level daily while on heparin Continue to monitor H&H and platelets Follow-up transition to oral anticoagulation  Lennie Muckle, PharmD PGY1 Pharmacy Resident 05/25/2023 12:47 PM

## 2023-05-25 NOTE — Progress Notes (Signed)
 ANTICOAGULATION CONSULT NOTE  Pharmacy Consult for Heparin Indication: pulmonary embolus  Allergies  Allergen Reactions   Contrast Media [Iodinated Contrast Media] Anaphylaxis   Iodine Anaphylaxis   Sulfamethoxazole-Trimethoprim Nausea And Vomiting and Swelling    Other Reaction(s): Renal failure syndrome   Lisinopril Other (See Comments)    hyperkalemia  Other Reaction(s): Hyperkalemia   Varenicline     Other Reaction(s): Altered mental status   Ciprofloxacin Other (See Comments)    unknown   Metformin Other (See Comments)    Increased lactic acid  Other Reaction(s): Increased lactic acid level    Patient Measurements: Height: 5\' 9"  (175.3 cm) Weight: 56.3 kg (124 lb 1.9 oz) IBW/kg (Calculated) : 70.7 Heparin Dosing Weight: 59.9 kg  Vital Signs: Temp: 97.8 F (36.6 C) (03/09 2002) Temp Source: Oral (03/09 2002) BP: 106/53 (03/09 2041) Pulse Rate: 109 (03/09 2041)  Labs: Recent Labs    05/23/23 1502 05/23/23 1635 05/24/23 0345 05/24/23 1739 05/25/23 0559 05/25/23 1152 05/25/23 2115  HGB 11.6*  --  9.9*  --   --  10.3*  --   HCT 34.1*  --  29.9*  --   --  30.7*  --   PLT 432*  --  362  --   --  364  --   APTT 30  --   --   --   --   --   --   LABPROT 12.8  --   --   --   --   --   --   INR 0.9  --   --   --   --   --   --   HEPARINUNFRC <0.10*  --  0.40 0.32  --  0.27* 0.46  CREATININE 1.11  --  1.04  --  1.06  --   --   TROPONINIHS 35* 29*  --   --   --   --   --     Estimated Creatinine Clearance: 53.9 mL/min (by C-G formula based on SCr of 1.06 mg/dL).   Medical History: Past Medical History:  Diagnosis Date   Cancer (HCC)    lung cancer   COPD (chronic obstructive pulmonary disease) (HCC)    Diabetes mellitus without complication (HCC)    Hyperlipemia    Hypertension     Medications:  Medications Prior to Admission  Medication Sig Dispense Refill Last Dose/Taking   albuterol (PROVENTIL HFA;VENTOLIN HFA) 108 (90 BASE) MCG/ACT inhaler  Inhale 1 puff into the lungs 4 (four) times daily as needed for shortness of breath.   Taking As Needed   aspirin EC 81 MG tablet Take 81 mg by mouth daily. Swallow whole.   05/22/2023 Morning   atorvastatin (LIPITOR) 80 MG tablet Take 1 tablet (80 mg total) by mouth daily. (Patient taking differently: Take 40 mg by mouth at bedtime.) 30 tablet 3 05/22/2023 Bedtime   colchicine 0.6 MG tablet Take 1 tablet (0.6 mg total) by mouth 2 (two) times daily. 180 tablet 0 05/22/2023   cyclobenzaprine (FLEXERIL) 10 MG tablet Take 10 mg by mouth at bedtime.   05/22/2023 Bedtime   empagliflozin (JARDIANCE) 25 MG TABS tablet Take 12.5 mg by mouth daily.   Taking   fenofibrate (TRICOR) 145 MG tablet Take 145 mg by mouth daily.   05/22/2023 Morning   furosemide (LASIX) 40 MG tablet Take 40 mg by mouth daily.   05/22/2023 Morning   gabapentin (NEURONTIN) 300 MG capsule Take 300 mg by mouth 2 (two) times daily.  05/22/2023   glucose 4 GM chewable tablet Chew 4 tablets by mouth as needed for low blood sugar (blood sugar less than 70).   Taking As Needed   ketoconazole (NIZORAL) 2 % cream Apply 1 Application topically daily.   Taking   losartan (COZAAR) 25 MG tablet Take 25 mg by mouth daily.   05/22/2023 Morning   magnesium oxide (MAG-OX) 400 (240 Mg) MG tablet Take 400 mg by mouth daily.   05/22/2023   mirtazapine (REMERON) 30 MG tablet Take 30 mg by mouth at bedtime.   05/22/2023   omeprazole (PRILOSEC) 40 MG capsule Take 40 mg by mouth daily.   05/22/2023 Morning   ticagrelor (BRILINTA) 90 MG TABS tablet Take 1 tablet (90 mg total) by mouth 2 (two) times daily. 60 tablet 3 05/22/2023   apixaban (ELIQUIS) 5 MG TABS tablet Take 2 tablets (10 mg total) by mouth 2 (two) times daily for 3 days, THEN 1 tablet (5 mg total) 2 (two) times daily for 27 days. (Patient not taking: Reported on 05/23/2023) 66 tablet 0 Not Taking   Blood Glucose Monitoring Suppl (BLOOD GLUCOSE MONITOR SYSTEM) w/Device KIT Use in the morning, at noon, and at bedtime. 1  kit 0    budesonide-formoterol (SYMBICORT) 160-4.5 MCG/ACT inhaler Inhale 2 puffs into the lungs 2 (two) times daily. (Patient not taking: Reported on 03/09/2023)   Not Taking   Glucose Blood (BLOOD GLUCOSE TEST STRIPS) STRP Use in the morning, at noon, and at bedtime. 100 strip 0    HYDROcodone-acetaminophen (NORCO/VICODIN) 5-325 MG per tablet Take 1 tablet by mouth every 8 (eight) hours as needed. For pain (Patient not taking: Reported on 05/23/2023)   Not Taking   insulin aspart (NOVOLOG) 100 UNIT/ML FlexPen Inject 12 Units into the skin 3 (three) times daily with meals. (Patient not taking: Reported on 05/23/2023) 15 mL 0 Not Taking   insulin glargine-yfgn (SEMGLEE) 100 UNIT/ML Pen Inject 5 Units into the skin at bedtime. Reassess to go up on home dose which is 32 u slowly with PCP (Patient not taking: Reported on 05/23/2023)   Not Taking   Insulin Pen Needle 32G X 4 MM MISC Use 4 (four) times daily -  before meals and at bedtime. 100 each 0    metoprolol succinate (TOPROL-XL) 25 MG 24 hr tablet Take 1 tablet (25 mg total) by mouth at bedtime. 30 tablet 0    nitroGLYCERIN (NITROSTAT) 0.4 MG SL tablet Place 1 tablet (0.4 mg total) under the tongue every 5 (five) minutes as needed for chest pain. 25 tablet 1    rosuvastatin (CRESTOR) 40 MG tablet Take 20 mg by mouth daily. (Patient not taking: Reported on 05/23/2023)   Not Taking   Study - EVOLVE-MI - evolocumab (REPATHA) 140 mg/mL SQ injection (PI-Stuckey) Inject 1 mL (140 mg total) into the skin every 14 (fourteen) days. For Investigational Use Only. Inject subcutaneously into abdomen, thigh, or upper arm every 14 days. Rotate injection sites and do not inject into areas where skin is tender, bruised, or red. Please contact [facility] for any questions or concerns regarding this medication. (Patient not taking: Reported on 03/10/2023) 14 mL 0 Not Taking   Scheduled:   atorvastatin  40 mg Oral QHS   fenofibrate  160 mg Oral Daily   gabapentin  300  mg Oral BID   insulin aspart  0-5 Units Subcutaneous QHS   insulin aspart  0-6 Units Subcutaneous TID WC   metoprolol tartrate  12.5 mg Oral  BID   mirtazapine  30 mg Oral QHS   pantoprazole  40 mg Oral Daily   sodium chloride flush  3 mL Intravenous Q12H   ticagrelor  90 mg Oral BID   Infusions:   heparin 1,150 Units/hr (05/25/23 1454)   PRN: acetaminophen **OR** acetaminophen, guaiFENesin, menthol-cetylpyridinium, oxyCODONE, polyethylene glycol  Assessment: 68 yom with a history of PE, COPD, hypertension, hyperlipidemia, and 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 Patient is presenting with chest pain. Heparin per pharmacy consult placed for pulmonary embolus.  Provider note mentions on eliquis prior to arrival. Patient does not recall being on this and is not on his med list. EPIC is showing 30 day supply filled 03/17/23.  Imaging is pending.  Baseline heparin level is <0.1. Would suggest has not been taking anti-Xa anticoagulant. aPTT 30 Hgb 11.6; plt 432  3/9 PM update:  Heparin level therapeutic   Goal of Therapy:  Heparin level 0.3-0.7 units/ml Monitor platelets by anticoagulation protocol: Yes   Plan:  Cont heparin infusion at 1150 units/hr Heparin level with AM labs  Abran Duke, PharmD, BCPS Clinical Pharmacist Phone: [phone]

## 2023-05-26 ENCOUNTER — Other Ambulatory Visit (HOSPITAL_COMMUNITY): Payer: Self-pay

## 2023-05-26 DIAGNOSIS — R000 Tachycardia, unspecified: Secondary | ICD-10-CM | POA: Diagnosis not present

## 2023-05-26 DIAGNOSIS — I2699 Other pulmonary embolism without acute cor pulmonale: Secondary | ICD-10-CM | POA: Diagnosis not present

## 2023-05-26 LAB — CBC
HCT: 31.1 % — ABNORMAL LOW (ref 39.0–52.0)
Hemoglobin: 10.4 g/dL — ABNORMAL LOW (ref 13.0–17.0)
MCH: 31.7 pg (ref 26.0–34.0)
MCHC: 33.4 g/dL (ref 30.0–36.0)
MCV: 94.8 fL (ref 80.0–100.0)
Platelets: 372 10*3/uL (ref 150–400)
RBC: 3.28 MIL/uL — ABNORMAL LOW (ref 4.22–5.81)
RDW: 15.8 % — ABNORMAL HIGH (ref 11.5–15.5)
WBC: 10.5 10*3/uL (ref 4.0–10.5)
nRBC: 0 % (ref 0.0–0.2)

## 2023-05-26 LAB — BASIC METABOLIC PANEL
Anion gap: 7 (ref 5–15)
BUN: 22 mg/dL (ref 8–23)
CO2: 20 mmol/L — ABNORMAL LOW (ref 22–32)
Calcium: 8.9 mg/dL (ref 8.9–10.3)
Chloride: 103 mmol/L (ref 98–111)
Creatinine, Ser: 1.13 mg/dL (ref 0.61–1.24)
GFR, Estimated: >60 mL/min (ref >60)
Glucose, Bld: 116 mg/dL — ABNORMAL HIGH (ref 70–99)
Potassium: 5 mmol/L (ref 3.5–5.1)
Sodium: 130 mmol/L — ABNORMAL LOW (ref 135–145)

## 2023-05-26 LAB — GLUCOSE, CAPILLARY
Glucose-Capillary: 155 mg/dL — ABNORMAL HIGH (ref 70–99)
Glucose-Capillary: 280 mg/dL — ABNORMAL HIGH (ref 70–99)
Glucose-Capillary: 155 mg/dL — ABNORMAL HIGH (ref 70–99)
Glucose-Capillary: 235 mg/dL — ABNORMAL HIGH (ref 70–99)

## 2023-05-26 LAB — HEPARIN LEVEL (UNFRACTIONATED): Heparin Unfractionated: 0.77 [IU]/mL — ABNORMAL HIGH (ref 0.30–0.70)

## 2023-05-26 MED ORDER — MIDODRINE HCL 5 MG PO TABS
5.0000 mg | ORAL_TABLET | Freq: Three times a day (TID) | ORAL | Status: DC
  Administered 2023-05-26 – 2023-05-29 (×10): 5 mg via ORAL
  Filled 2023-05-26 (×10): qty 1

## 2023-05-26 NOTE — Progress Notes (Signed)
 PROGRESS NOTE    Takashi Korol  JYN:[id number] DOB: [birth year] DOA: 05/23/2023 PCP: Default, Provider, MD    Brief Narrative:  Martin Thomas is a 68 y.o. male with medical history significant for hypertension, hyperlipidemia, type 2 diabetes mellitus, lung cancer, COPD, CAD, PE in December 2024 on Eliquis, cognitive impairment, and poor adherence with his medications, now presenting with chest pain.   Patient reports waking at approximately 1 PM today with chest pressure.  He describes this as "9/10" in severity, constant, localized to the central chest, and unchanged with nitroglycerin.  He denies shortness of breath or change in his chronic cough.     Patient manages his own medications.  He is not aware that he was supposed to be on a blood thinner and does not believe that he is taking one.    Assessment and Plan:  Pulmonary embolism  -has not been taking his elquis at home-- unclear how long -v/q done with increased PE - Continue IV heparin for now, continue cardiac monitoring- transition to PO when more stable -echo shows similar EF-- 35-40%    CAD; elevated troponin   - Troponin minimally elevated and decreasing in setting of acute PE    - Continue Lipitor, Brilinta  -add back low dose BB- titrate as able   Hypertension  - SBP 90s in ED and antihypertensives held on admission      Type II DM  - A1c was 8.2% in December 2024  - SSI   Chronic HFrEF  - EF was 35-40% on TTE from December 2024  - echo similar to prior   COPD  - Not in exacerbation  - Continue ICS-LABA and as-needed albuterol      Cognitive impairment  - Delirium precautions  - Needs help with managing his medications at home, TOC consulted    Hypomagnesemia -replete  DVT prophylaxis:     Code Status: Full Code   Disposition Plan:  Level of care: Progressive Status is: Inpatient Remains inpatient appropriate     Consultants:  none   Subjective: Has not been taking his eliquis No  SOB, no CP  Objective: Vitals:   05/25/23 2041 05/26/23 0336 05/26/23 0810 05/26/23 1002  BP: (!) 106/53 93/64 116/67 116/67  Pulse: (!) 109  72 (!) 106  Resp:   18   Temp:  98.6 F (37 C) 97.7 F (36.5 C)   TempSrc:  Oral Oral   SpO2:  96% 93%   Weight:      Height:        Intake/Output Summary (Last 24 hours) at 05/26/2023 1055 Last data filed at 05/26/2023 0100 Gross per 24 hour  Intake 301.28 ml  Output 990 ml  Net -688.72 ml   Filed Weights   05/23/23 1435 05/24/23 1343  Weight: 59.9 kg 56.3 kg    Examination:   General: Appearance:    Thin male in no acute distress     Lungs:     respirations unlabored  Heart:    Tachycardic.   MS:   Amputation noted  Neurologic:   Awake, alert       Data Reviewed: I have personally reviewed following labs and imaging studies  CBC: Recent Labs  Lab 05/23/23 1502 05/24/23 0345 05/25/23 1152  WBC 12.4* 11.9* 9.4  NEUTROABS 9.4*  --   --   HGB 11.6* 9.9* 10.3*  HCT 34.1* 29.9* 30.7*  MCV 93.2 93.7 92.5  PLT 432* 362 364   Basic Metabolic Panel: Recent Labs  Lab 05/23/23 1502 05/24/23 0345 05/25/23 0559 05/26/23 0500  NA 134* 129* 128* 130*  K 5.1 4.1 5.2* 5.0  CL 106 105 104 103  CO2 22 17* 15* 20*  GLUCOSE 186* 163* 148* 116*  BUN 30* 27* 23 22  CREATININE 1.11 1.04 1.06 1.13  CALCIUM 8.9 8.4* 8.5* 8.9  MG  --  1.5*  --   --    GFR: Estimated Creatinine Clearance: 50.5 mL/min (by C-G formula based on SCr of 1.13 mg/dL). Liver Function Tests: Recent Labs  Lab 05/23/23 1502  AST 39  ALT 15  ALKPHOS 173*  BILITOT 1.0  PROT 5.4*  ALBUMIN 2.3*   No results for input(s): "LIPASE", "AMYLASE" in the last 168 hours. No results for input(s): "AMMONIA" in the last 168 hours. Coagulation Profile: Recent Labs  Lab 05/23/23 1502  INR 0.9   Cardiac Enzymes: No results for input(s): "CKTOTAL", "CKMB", "CKMBINDEX", "TROPONINI" in the last 168 hours. BNP (last 3 results) No results for input(s):  "PROBNP" in the last 8760 hours. HbA1C: No results for input(s): "HGBA1C" in the last 72 hours. CBG: Recent Labs  Lab 05/25/23 0812 05/25/23 1149 05/25/23 1628 05/25/23 2205 05/26/23 0908  GLUCAP 136* 288* 167* 192* 155*   Lipid Profile: No results for input(s): "CHOL", "HDL", "LDLCALC", "TRIG", "CHOLHDL", "LDLDIRECT" in the last 72 hours. Thyroid Function Tests: No results for input(s): "TSH", "T4TOTAL", "FREET4", "T3FREE", "THYROIDAB" in the last 72 hours. Anemia Panel: No results for input(s): "VITAMINB12", "FOLATE", "FERRITIN", "TIBC", "IRON", "RETICCTPCT" in the last 72 hours. Sepsis Labs: Recent Labs  Lab 05/23/23 1509 05/23/23 1640  LATICACIDVEN 1.8 0.9    Recent Results (from the past 240 hours)  Culture, blood (Routine x 2)     Status: None (Preliminary result)   Collection Time: 05/23/23  3:02 PM   Specimen: BLOOD RIGHT ARM  Result Value Ref Range Status   Specimen Description BLOOD RIGHT ARM  Final   Special Requests   Final    BOTTLES DRAWN AEROBIC AND ANAEROBIC Blood Culture adequate volume   Culture   Final    NO GROWTH 3 DAYS Performed at [facility], [address] [place], [place] [address]    Report Status PENDING  Incomplete  Culture, blood (Routine x 2)     Status: None (Preliminary result)   Collection Time: 05/23/23  3:02 PM   Specimen: BLOOD RIGHT HAND  Result Value Ref Range Status   Specimen Description BLOOD RIGHT HAND  Final   Special Requests   Final    BOTTLES DRAWN AEROBIC AND ANAEROBIC Blood Culture results may not be optimal due to an inadequate volume of blood received in culture bottles   Culture   Final    NO GROWTH 3 DAYS Performed at [facility], [address] [place], [place] [address]    Report Status PENDING  Incomplete  Resp panel by RT-PCR (RSV, Flu A&B, Covid) Anterior Nasal Swab     Status: None   Collection Time: 05/23/23  3:02 PM   Specimen: Anterior Nasal Swab  Result Value Ref Range Status   SARS  Coronavirus 2 by RT PCR NEGATIVE NEGATIVE Final   Influenza A by PCR NEGATIVE NEGATIVE Final   Influenza B by PCR NEGATIVE NEGATIVE Final    Comment: (NOTE) The Xpert Xpress SARS-CoV-2/FLU/RSV plus assay is intended as an aid in the diagnosis of influenza from Nasopharyngeal swab specimens and should not be used as a sole basis for treatment. Nasal  washings and aspirates are unacceptable for Xpert Xpress SARS-CoV-2/FLU/RSV testing.  Fact Sheet for Patients: BloggerCourse.com  Fact Sheet for Healthcare Providers: SeriousBroker.it  This test is not yet approved or cleared by the [place] FDA and has been authorized for detection and/or diagnosis of SARS-CoV-2 by FDA under an Emergency Use Authorization (EUA). This EUA will remain in effect (meaning this test can be used) for the duration of the COVID-19 declaration under Section 564(b)(1) of the Act, 21 U.S.C. section 360bbb-3(b)(1), unless the authorization is terminated or revoked.     Resp Syncytial Virus by PCR NEGATIVE NEGATIVE Final    Comment: (NOTE) Fact Sheet for Patients: BloggerCourse.com  Fact Sheet for Healthcare Providers: SeriousBroker.it  This test is not yet approved or cleared by the [place] FDA and has been authorized for detection and/or diagnosis of SARS-CoV-2 by FDA under an Emergency Use Authorization (EUA). This EUA will remain in effect (meaning this test can be used) for the duration of the COVID-19 declaration under Section 564(b)(1) of the Act, 21 U.S.C. section 360bbb-3(b)(1), unless the authorization is terminated or revoked.  Performed at [facility], [address], [place], [place] [address]          Radiology Studies: No results found.       Scheduled Meds:  atorvastatin  40 mg Oral QHS   fenofibrate  160 mg Oral Daily   gabapentin  300 mg Oral BID   insulin  aspart  0-5 Units Subcutaneous QHS   insulin aspart  0-6 Units Subcutaneous TID WC   metoprolol tartrate  12.5 mg Oral BID   midodrine  5 mg Oral TID WC   mirtazapine  30 mg Oral QHS   pantoprazole  40 mg Oral Daily   sodium chloride flush  3 mL Intravenous Q12H   ticagrelor  90 mg Oral BID   Continuous Infusions:  heparin 1,050 Units/hr (05/26/23 0818)     LOS: 3 days    Time spent: 45 minutes spent on chart review, discussion with nursing staff, consultants, updating family and interview/physical exam; more than 50% of that time was spent in counseling and/or coordination of care.    Joseph Art, DO [facility] Available via Epic secure chat 7am-7pm After these hours, please refer to coverage provider listed on [email or web address] 05/26/2023, 10:55 AM

## 2023-05-26 NOTE — Plan of Care (Signed)
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
## 2023-05-26 NOTE — Progress Notes (Signed)
 ANTICOAGULATION CONSULT NOTE  Pharmacy Consult for Heparin Indication: pulmonary embolus  Allergies  Allergen Reactions   Contrast Media [Iodinated Contrast Media] Anaphylaxis   Iodine Anaphylaxis   Sulfamethoxazole-Trimethoprim Nausea And Vomiting and Swelling    Other Reaction(s): Renal failure syndrome   Lisinopril Other (See Comments)    hyperkalemia  Other Reaction(s): Hyperkalemia   Varenicline     Other Reaction(s): Altered mental status   Ciprofloxacin Other (See Comments)    unknown   Metformin Other (See Comments)    Increased lactic acid  Other Reaction(s): Increased lactic acid level    Patient Measurements: Height: 5\' 9"  (175.3 cm) Weight: 56.3 kg (124 lb 1.9 oz) IBW/kg (Calculated) : 70.7 Heparin Dosing Weight: 59.9 kg  Vital Signs: Temp: 98.6 F (37 C) (03/10 0336) Temp Source: Oral (03/10 0336) BP: 93/64 (03/10 0336) Pulse Rate: 109 (03/09 2041)  Labs: Recent Labs    05/23/23 1502 05/23/23 1635 05/24/23 0345 05/24/23 1739 05/25/23 0559 05/25/23 1152 05/25/23 2115 05/26/23 0500  HGB 11.6*  --  9.9*  --   --  10.3*  --   --   HCT 34.1*  --  29.9*  --   --  30.7*  --   --   PLT 432*  --  362  --   --  364  --   --   APTT 30  --   --   --   --   --   --   --   LABPROT 12.8  --   --   --   --   --   --   --   INR 0.9  --   --   --   --   --   --   --   HEPARINUNFRC <0.10*  --  0.40   < >  --  0.27* 0.46 0.77*  CREATININE 1.11  --  1.04  --  1.06  --   --  1.13  TROPONINIHS 35* 29*  --   --   --   --   --   --    < > = values in this interval not displayed.    Estimated Creatinine Clearance: 50.5 mL/min (by C-G formula based on SCr of 1.13 mg/dL).   Medical History: Past Medical History:  Diagnosis Date   Cancer (HCC)    lung cancer   COPD (chronic obstructive pulmonary disease) (HCC)    Diabetes mellitus without complication (HCC)    Hyperlipemia    Hypertension     Medications:  Medications Prior to Admission  Medication Sig  Dispense Refill Last Dose/Taking   albuterol (PROVENTIL HFA;VENTOLIN HFA) 108 (90 BASE) MCG/ACT inhaler Inhale 1 puff into the lungs 4 (four) times daily as needed for shortness of breath.   Taking As Needed   aspirin EC 81 MG tablet Take 81 mg by mouth daily. Swallow whole.   05/22/2023 Morning   atorvastatin (LIPITOR) 80 MG tablet Take 1 tablet (80 mg total) by mouth daily. (Patient taking differently: Take 40 mg by mouth at bedtime.) 30 tablet 3 05/22/2023 Bedtime   colchicine 0.6 MG tablet Take 1 tablet (0.6 mg total) by mouth 2 (two) times daily. 180 tablet 0 05/22/2023   cyclobenzaprine (FLEXERIL) 10 MG tablet Take 10 mg by mouth at bedtime.   05/22/2023 Bedtime   empagliflozin (JARDIANCE) 25 MG TABS tablet Take 12.5 mg by mouth daily.   Taking   fenofibrate (TRICOR) 145 MG tablet Take 145 mg by  mouth daily.   05/22/2023 Morning   furosemide (LASIX) 40 MG tablet Take 40 mg by mouth daily.   05/22/2023 Morning   gabapentin (NEURONTIN) 300 MG capsule Take 300 mg by mouth 2 (two) times daily.   05/22/2023   glucose 4 GM chewable tablet Chew 4 tablets by mouth as needed for low blood sugar (blood sugar less than 70).   Taking As Needed   ketoconazole (NIZORAL) 2 % cream Apply 1 Application topically daily.   Taking   losartan (COZAAR) 25 MG tablet Take 25 mg by mouth daily.   05/22/2023 Morning   magnesium oxide (MAG-OX) 400 (240 Mg) MG tablet Take 400 mg by mouth daily.   05/22/2023   mirtazapine (REMERON) 30 MG tablet Take 30 mg by mouth at bedtime.   05/22/2023   omeprazole (PRILOSEC) 40 MG capsule Take 40 mg by mouth daily.   05/22/2023 Morning   ticagrelor (BRILINTA) 90 MG TABS tablet Take 1 tablet (90 mg total) by mouth 2 (two) times daily. 60 tablet 3 05/22/2023   apixaban (ELIQUIS) 5 MG TABS tablet Take 2 tablets (10 mg total) by mouth 2 (two) times daily for 3 days, THEN 1 tablet (5 mg total) 2 (two) times daily for 27 days. (Patient not taking: Reported on 05/23/2023) 66 tablet 0 Not Taking   Blood Glucose  Monitoring Suppl (BLOOD GLUCOSE MONITOR SYSTEM) w/Device KIT Use in the morning, at noon, and at bedtime. 1 kit 0    budesonide-formoterol (SYMBICORT) 160-4.5 MCG/ACT inhaler Inhale 2 puffs into the lungs 2 (two) times daily. (Patient not taking: Reported on 03/09/2023)   Not Taking   Glucose Blood (BLOOD GLUCOSE TEST STRIPS) STRP Use in the morning, at noon, and at bedtime. 100 strip 0    HYDROcodone-acetaminophen (NORCO/VICODIN) 5-325 MG per tablet Take 1 tablet by mouth every 8 (eight) hours as needed. For pain (Patient not taking: Reported on 05/23/2023)   Not Taking   insulin aspart (NOVOLOG) 100 UNIT/ML FlexPen Inject 12 Units into the skin 3 (three) times daily with meals. (Patient not taking: Reported on 05/23/2023) 15 mL 0 Not Taking   insulin glargine-yfgn (SEMGLEE) 100 UNIT/ML Pen Inject 5 Units into the skin at bedtime. Reassess to go up on home dose which is 32 u slowly with PCP (Patient not taking: Reported on 05/23/2023)   Not Taking   Insulin Pen Needle 32G X 4 MM MISC Use 4 (four) times daily -  before meals and at bedtime. 100 each 0    metoprolol succinate (TOPROL-XL) 25 MG 24 hr tablet Take 1 tablet (25 mg total) by mouth at bedtime. 30 tablet 0    nitroGLYCERIN (NITROSTAT) 0.4 MG SL tablet Place 1 tablet (0.4 mg total) under the tongue every 5 (five) minutes as needed for chest pain. 25 tablet 1    rosuvastatin (CRESTOR) 40 MG tablet Take 20 mg by mouth daily. (Patient not taking: Reported on 05/23/2023)   Not Taking   Study - EVOLVE-MI - evolocumab (REPATHA) 140 mg/mL SQ injection (PI-Stuckey) Inject 1 mL (140 mg total) into the skin every 14 (fourteen) days. For Investigational Use Only. Inject subcutaneously into abdomen, thigh, or upper arm every 14 days. Rotate injection sites and do not inject into areas where skin is tender, bruised, or red. Please contact [facility] for any questions or concerns regarding this medication. (Patient not taking: Reported on 03/10/2023) 14 mL 0 Not  Taking   Scheduled:   atorvastatin  40 mg Oral QHS  fenofibrate  160 mg Oral Daily   gabapentin  300 mg Oral BID   insulin aspart  0-5 Units Subcutaneous QHS   insulin aspart  0-6 Units Subcutaneous TID WC   metoprolol tartrate  12.5 mg Oral BID   mirtazapine  30 mg Oral QHS   pantoprazole  40 mg Oral Daily   sodium chloride flush  3 mL Intravenous Q12H   ticagrelor  90 mg Oral BID   Infusions:   heparin 1,150 Units/hr (05/26/23 0100)   PRN: acetaminophen **OR** acetaminophen, guaiFENesin, menthol-cetylpyridinium, oxyCODONE, polyethylene glycol  Assessment: 68 yom with a history of PE, COPD, hypertension, hyperlipidemia, and 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 Patient is presenting with chest pain. Heparin per pharmacy consult placed for pulmonary embolus.  -VQ scan with possible progressive PE -heparin level= 0.77 (above goal) on 1150 units/hr  Provider note mentions on eliquis prior to arrival. Patient does not recall being on this and is not on his med list. EPIC is showing 30 day supply filled 03/17/23.  Goal of Therapy:  Heparin level 0.3-0.7 units/ml Monitor platelets by anticoagulation protocol: Yes   Plan:  -Decrease heparin to 1050 units/hr -Heparin level and CBC in am  Harland German, PharmD Clinical Pharmacist **Pharmacist phone directory can now be found on [email or web address] ([email or web address]).  Listed under [facility].

## 2023-05-26 NOTE — Progress Notes (Signed)
 Heart Failure Navigator Progress Note  Assessed for Heart & Vascular TOC clinic readiness.  Patient does not meet criteria due to per MD notes patient with mild dementia/ cognitive impairment. .   Navigator will sign off at this time  Rhae Hammock, BSN, RN Heart Failure Print production planner Chat Only

## 2023-05-27 ENCOUNTER — Telehealth: Payer: Self-pay | Admitting: Cardiology

## 2023-05-27 DIAGNOSIS — R000 Tachycardia, unspecified: Secondary | ICD-10-CM | POA: Diagnosis not present

## 2023-05-27 DIAGNOSIS — I2699 Other pulmonary embolism without acute cor pulmonale: Secondary | ICD-10-CM | POA: Diagnosis not present

## 2023-05-27 LAB — BASIC METABOLIC PANEL
Anion gap: 7 (ref 5–15)
BUN: 24 mg/dL — ABNORMAL HIGH (ref 8–23)
CO2: 21 mmol/L — ABNORMAL LOW (ref 22–32)
Calcium: 8.7 mg/dL — ABNORMAL LOW (ref 8.9–10.3)
Chloride: 101 mmol/L (ref 98–111)
Creatinine, Ser: 1.21 mg/dL (ref 0.61–1.24)
GFR, Estimated: >60 mL/min (ref >60)
Glucose, Bld: 110 mg/dL — ABNORMAL HIGH (ref 70–99)
Potassium: 5.3 mmol/L — ABNORMAL HIGH (ref 3.5–5.1)
Sodium: 129 mmol/L — ABNORMAL LOW (ref 135–145)

## 2023-05-27 LAB — CBC
HCT: 29.3 % — ABNORMAL LOW (ref 39.0–52.0)
Hemoglobin: 9.9 g/dL — ABNORMAL LOW (ref 13.0–17.0)
MCH: 31.4 pg (ref 26.0–34.0)
MCHC: 33.8 g/dL (ref 30.0–36.0)
MCV: 93 fL (ref 80.0–100.0)
Platelets: 357 10*3/uL (ref 150–400)
RBC: 3.15 MIL/uL — ABNORMAL LOW (ref 4.22–5.81)
RDW: 15.8 % — ABNORMAL HIGH (ref 11.5–15.5)
WBC: 9.4 10*3/uL (ref 4.0–10.5)
nRBC: 0 % (ref 0.0–0.2)

## 2023-05-27 LAB — GLUCOSE, CAPILLARY
Glucose-Capillary: 92 mg/dL (ref 70–99)
Glucose-Capillary: 191 mg/dL — ABNORMAL HIGH (ref 70–99)
Glucose-Capillary: 207 mg/dL — ABNORMAL HIGH (ref 70–99)
Glucose-Capillary: 182 mg/dL — ABNORMAL HIGH (ref 70–99)

## 2023-05-27 LAB — HEPARIN LEVEL (UNFRACTIONATED): Heparin Unfractionated: 0.55 [IU]/mL (ref 0.30–0.70)

## 2023-05-27 MED ORDER — RIVAROXABAN 15 MG PO TABS
15.0000 mg | ORAL_TABLET | Freq: Two times a day (BID) | ORAL | Status: DC
  Administered 2023-05-27 – 2023-05-29 (×5): 15 mg via ORAL
  Filled 2023-05-27 (×5): qty 1

## 2023-05-27 MED ORDER — SODIUM ZIRCONIUM CYCLOSILICATE 5 G PO PACK
5.0000 g | PACK | Freq: Once | ORAL | Status: AC
  Administered 2023-05-27: 5 g via ORAL
  Filled 2023-05-27: qty 1

## 2023-05-27 MED ORDER — HYDROXYZINE HCL 25 MG PO TABS
25.0000 mg | ORAL_TABLET | Freq: Three times a day (TID) | ORAL | Status: AC | PRN
  Administered 2023-05-27 – 2023-05-28 (×3): 25 mg via ORAL
  Filled 2023-05-27 (×3): qty 1

## 2023-05-27 MED ORDER — RIVAROXABAN 20 MG PO TABS: 20.0000 mg | ORAL_TABLET | Freq: Every day | ORAL | Status: DC

## 2023-05-27 NOTE — Discharge Instructions (Signed)
 Information on my medicine - XARELTO (rivaroxaban)   WHY WAS XARELTO PRESCRIBED FOR YOU? Xarelto was prescribed to treat blood clots that may have been found in the veins of your legs (deep vein thrombosis) or in your lungs (pulmonary embolism) and to reduce the risk of them occurring again.  What do you need to know about Xarelto? The starting dose is one 15 mg tablet taken TWICE daily with food for the FIRST 21 DAYS then on 06/17/2023  the dose is changed to one 20 mg tablet taken ONCE A DAY with your evening meal.  DO NOT stop taking Xarelto without talking to the health care provider who prescribed the medication.  Refill your prescription for 20 mg tablets before you run out.  After discharge, you should have regular check-up appointments with your healthcare provider that is prescribing your Xarelto.  In the future your dose may need to be changed if your kidney function changes by a significant amount.  What do you do if you miss a dose? If you are taking Xarelto TWICE DAILY and you miss a dose, take it as soon as you remember. You may take two 15 mg tablets (total 30 mg) at the same time then resume your regularly scheduled 15 mg twice daily the next day.  If you are taking Xarelto ONCE DAILY and you miss a dose, take it as soon as you remember on the same day then continue your regularly scheduled once daily regimen the next day. Do not take two doses of Xarelto at the same time.   Important Safety Information Xarelto is a blood thinner medicine that can cause bleeding. You should call your healthcare provider right away if you experience any of the following: Bleeding from an injury or your nose that does not stop. Unusual colored urine (red or dark brown) or unusual colored stools (red or black). Unusual bruising for unknown reasons. A serious fall or if you hit your head (even if there is no bleeding).  Some medicines may interact with Xarelto and might increase your  risk of bleeding while on Xarelto. To help avoid this, consult your healthcare provider or pharmacist prior to using any new prescription or non-prescription medications, including herbals, vitamins, non-steroidal anti-inflammatory drugs (NSAIDs) and supplements.  This website has more information on Xarelto: VisitDestination.com.br.

## 2023-05-27 NOTE — Progress Notes (Signed)
 PROGRESS NOTE    Tegh Franek  XWR:[id number] DOB: 03-01-1956 DOA: 05/23/2023 PCP: Default, Provider, MD    Brief Narrative:  Martin Thomas is a 68 y.o. male with medical history significant for hypertension, hyperlipidemia, type 2 diabetes mellitus, lung cancer, COPD, CAD, PE in December 2024 on Eliquis, cognitive impairment, and poor adherence with his medications, now presenting with chest pain.   Patient reports waking up with chest pressure.  He denies shortness of breath or change in his chronic cough.     Patient manages his own medications.  He is not aware that he was supposed to be on a blood thinner and does not believe that he is taking one.    Assessment and Plan:  Pulmonary embolism  -has not been taking his eliquis at home-- unclear how long -v/q done with increased PE -IV heparin -- change to PO xarelto (have called family to have them more involved in making sure he takes his medications) -echo shows similar EF-- 35-40%    CAD; elevated troponin   - Troponin minimally elevated and decreasing in setting of acute PE    - Continue Lipitor, Brilinta  -add back low dose BB- titrate as able   Hypertension  - SBP 90s in ED and antihypertensives held on admission   -resumed metoprolol low dose due to tachycardia and midodrine -check AM cortisol    Type II DM  - A1c was 8.2% in December 2024  - SSI   Chronic HFrEF  - EF was 35-40% on TTE from December 2024  - echo similar to prior   COPD  - Not in exacerbation  - Continue ICS-LABA and as-needed albuterol      Cognitive impairment  - Delirium precautions  - Needs help with managing his medications at home, TOC consulted    Hypomagnesemia -replete  Chronic respiratory failure -wears O2 at night only  Hyperkalemia -lokelma x 1  DVT prophylaxis:     Code Status: Full Code   Disposition Plan:  Level of care: Progressive Status is: Inpatient Remains inpatient appropriate   Called niece, no  answer-- she needs to be more involve with his medications to ensure compliance   Consultants:  none   Subjective: Has not been taking his eliquis Denies chest pain currently   Objective: Vitals:   05/26/23 2131 05/26/23 2329 05/27/23 0226 05/27/23 0542  BP: 120/60 117/67 115/68 (!) 93/46  Pulse: 100 95 92 90  Resp: 19 16 19 17   Temp:  98 F (36.7 C)  98.6 F (37 C)  TempSrc:  Oral  Oral  SpO2: 95% 96% 95% 93%  Weight:      Height:        Intake/Output Summary (Last 24 hours) at 05/27/2023 0758 Last data filed at 05/27/2023 0234 Gross per 24 hour  Intake 480 ml  Output 1200 ml  Net -720 ml   Filed Weights   05/23/23 1435 05/24/23 1343  Weight: 59.9 kg 56.3 kg    Examination:   General: Appearance:    Thin male in no acute distress     Lungs:      respirations unlabored  Heart:    Normal heart rate.      Neurologic:   Awake, alert, oriented to person/place       Data Reviewed: I have personally reviewed following labs and imaging studies  CBC: Recent Labs  Lab 05/23/23 1502 05/24/23 0345 05/25/23 1152 05/26/23 0927 05/27/23 0517  WBC 12.4* 11.9* 9.4 10.5 9.4  NEUTROABS 9.4*  --   --   --   --   HGB 11.6* 9.9* 10.3* 10.4* 9.9*  HCT 34.1* 29.9* 30.7* 31.1* 29.3*  MCV 93.2 93.7 92.5 94.8 93.0  PLT 432* 362 364 372 357   Basic Metabolic Panel: Recent Labs  Lab 05/23/23 1502 05/24/23 0345 05/25/23 0559 05/26/23 0500 05/27/23 0517  NA 134* 129* 128* 130* 129*  K 5.1 4.1 5.2* 5.0 5.3*  CL 106 105 104 103 101  CO2 22 17* 15* 20* 21*  GLUCOSE 186* 163* 148* 116* 110*  BUN 30* 27* 23 22 24*  CREATININE 1.11 1.04 1.06 1.13 1.21  CALCIUM 8.9 8.4* 8.5* 8.9 8.7*  MG  --  1.5*  --   --   --    GFR: Estimated Creatinine Clearance: 47.2 mL/min (by C-G formula based on SCr of 1.21 mg/dL). Liver Function Tests: Recent Labs  Lab 05/23/23 1502  AST 39  ALT 15  ALKPHOS 173*  BILITOT 1.0  PROT 5.4*  ALBUMIN 2.3*   No results for input(s):  "LIPASE", "AMYLASE" in the last 168 hours. No results for input(s): "AMMONIA" in the last 168 hours. Coagulation Profile: Recent Labs  Lab 05/23/23 1502  INR 0.9   Cardiac Enzymes: No results for input(s): "CKTOTAL", "CKMB", "CKMBINDEX", "TROPONINI" in the last 168 hours. BNP (last 3 results) No results for input(s): "PROBNP" in the last 8760 hours. HbA1C: No results for input(s): "HGBA1C" in the last 72 hours. CBG: Recent Labs  Lab 05/25/23 2205 05/26/23 0908 05/26/23 1140 05/26/23 1655 05/26/23 2113  GLUCAP 192* 155* 280* 155* 235*   Lipid Profile: No results for input(s): "CHOL", "HDL", "LDLCALC", "TRIG", "CHOLHDL", "LDLDIRECT" in the last 72 hours. Thyroid Function Tests: No results for input(s): "TSH", "T4TOTAL", "FREET4", "T3FREE", "THYROIDAB" in the last 72 hours. Anemia Panel: No results for input(s): "VITAMINB12", "FOLATE", "FERRITIN", "TIBC", "IRON", "RETICCTPCT" in the last 72 hours. Sepsis Labs: Recent Labs  Lab 05/23/23 1509 05/23/23 1640  LATICACIDVEN 1.8 0.9    Recent Results (from the past 240 hours)  Culture, blood (Routine x 2)     Status: None (Preliminary result)   Collection Time: 05/23/23  3:02 PM   Specimen: BLOOD RIGHT ARM  Result Value Ref Range Status   Specimen Description BLOOD RIGHT ARM  Final   Special Requests   Final    BOTTLES DRAWN AEROBIC AND ANAEROBIC Blood Culture adequate volume   Culture   Final    NO GROWTH 3 DAYS Performed at [facility], [address] [place], [place] [address]    Report Status PENDING  Incomplete  Culture, blood (Routine x 2)     Status: None (Preliminary result)   Collection Time: 05/23/23  3:02 PM   Specimen: BLOOD RIGHT HAND  Result Value Ref Range Status   Specimen Description BLOOD RIGHT HAND  Final   Special Requests   Final    BOTTLES DRAWN AEROBIC AND ANAEROBIC Blood Culture results may not be optimal due to an inadequate volume of blood received in culture bottles   Culture   Final     NO GROWTH 3 DAYS Performed at [facility], [address], [place], [place] [address]    Report Status PENDING  Incomplete  Resp panel by RT-PCR (RSV, Flu A&B, Covid) Anterior Nasal Swab     Status: None   Collection Time: 05/23/23  3:02 PM   Specimen: Anterior Nasal Swab  Result Value Ref Range Status   SARS  Coronavirus 2 by RT PCR NEGATIVE NEGATIVE Final   Influenza A by PCR NEGATIVE NEGATIVE Final   Influenza B by PCR NEGATIVE NEGATIVE Final    Comment: (NOTE) The Xpert Xpress SARS-CoV-2/FLU/RSV plus assay is intended as an aid in the diagnosis of influenza from Nasopharyngeal swab specimens and should not be used as a sole basis for treatment. Nasal washings and aspirates are unacceptable for Xpert Xpress SARS-CoV-2/FLU/RSV testing.  Fact Sheet for Patients: BloggerCourse.com  Fact Sheet for Healthcare Providers: SeriousBroker.it  This test is not yet approved or cleared by the [place] FDA and has been authorized for detection and/or diagnosis of SARS-CoV-2 by FDA under an Emergency Use Authorization (EUA). This EUA will remain in effect (meaning this test can be used) for the duration of the COVID-19 declaration under Section 564(b)(1) of the Act, 21 U.S.C. section 360bbb-3(b)(1), unless the authorization is terminated or revoked.     Resp Syncytial Virus by PCR NEGATIVE NEGATIVE Final    Comment: (NOTE) Fact Sheet for Patients: BloggerCourse.com  Fact Sheet for Healthcare Providers: SeriousBroker.it  This test is not yet approved or cleared by the [place] FDA and has been authorized for detection and/or diagnosis of SARS-CoV-2 by FDA under an Emergency Use Authorization (EUA). This EUA will remain in effect (meaning this test can be used) for the duration of the COVID-19 declaration under Section 564(b)(1) of the Act, 21 U.S.C. section  360bbb-3(b)(1), unless the authorization is terminated or revoked.  Performed at [facility], [address], [place], [place] [address]          Radiology Studies: No results found.       Scheduled Meds:  atorvastatin  40 mg Oral QHS   fenofibrate  160 mg Oral Daily   gabapentin  300 mg Oral BID   insulin aspart  0-5 Units Subcutaneous QHS   insulin aspart  0-6 Units Subcutaneous TID WC   metoprolol tartrate  12.5 mg Oral BID   midodrine  5 mg Oral TID WC   mirtazapine  30 mg Oral QHS   pantoprazole  40 mg Oral Daily   sodium chloride flush  3 mL Intravenous Q12H   sodium zirconium cyclosilicate  5 g Oral Once   ticagrelor  90 mg Oral BID   Continuous Infusions:  heparin 1,050 Units/hr (05/26/23 2306)     LOS: 4 days    Time spent: 45 minutes spent on chart review, discussion with nursing staff, consultants, updating family and interview/physical exam; more than 50% of that time was spent in counseling and/or coordination of care.    Joseph Art, DO [facility] Available via Epic secure chat 7am-7pm After these hours, please refer to coverage provider listed on [email or web address] 05/27/2023, 7:58 AM

## 2023-05-27 NOTE — Telephone Encounter (Signed)
 TOC scheduled for 06/11/23 at 10:05am with Tereso Newcomer per staff message from Dr. Benjamine Mola

## 2023-05-27 NOTE — TOC Progression Note (Signed)
 Transition of Care [facility]) - Progression Note    Patient Details  Name: Martin Thomas MRN: [id number] Date of Birth: May 05, 1955  Transition of Care [facility]) CM/SW Contact  Alesia Richards, RN Phone Number:[phone] 05/27/2023, 2:40 PM  Clinical Narrative:     CM to patient's room regarding home health RN for medication management. Patient is amenable to home health RN. CM call to Elnita Maxwell [facility], phone: [phone] regarding home health services. Per Elnita Maxwell, agency can accept upon receipt of [facility] authorization. CM call to [facility], phone: [phone]. CM spoke to [facility]. Per Corrie Dandy, will leave message for Dr. [facility] to place consult for home health RN for medication management.   Expected Discharge Plan: Home/Self Care Barriers to Discharge: Continued Medical Work up  Expected Discharge Plan and Services   Discharge Planning Services: CM Consult   Living arrangements for the past 2 months: Single Family Home     Social Determinants of Health (SDOH) Interventions SDOH Screenings   Food Insecurity: No Food Insecurity (05/23/2023)  Housing: Low Risk  (05/23/2023)  Transportation Needs: No Transportation Needs (05/23/2023)  Utilities: Not At Risk (05/23/2023)  Social Connections: Moderately Integrated (05/23/2023)  Tobacco Use: High Risk (05/23/2023)  Health Literacy: Adequate Health Literacy (04/12/2021)   Received from [facility]    Readmission Risk Interventions    07/23/2022    2:25 PM  Readmission Risk Prevention Plan  Transportation Screening Complete  PCP or Specialist Appt within 3-5 Days --  HRI or Home Care Consult Complete  Palliative Care Screening Not Applicable  Medication Review (RN Care Manager) Complete

## 2023-05-27 NOTE — TOC Progression Note (Signed)
 Transition of Care [facility]) - Progression Note    Patient Details  Name: Martin Thomas MRN: [id number] Date of Birth: 1955-06-27  Transition of Care [facility]) CM/SW Contact  Alesia Richards, RN Phone Number: 05/27/2023, 2:56 PM  Clinical Narrative:     CM to patient's room regarding home health RN services for medication management. Patient is amenable to home health RN services. CM call to [facility], phone: [phone] regarding home health referral for RN services. Per Elnita Maxwell, agency can accept and start of care is pending receipt of VA authorization. CM call to [facility], phone: [phone] regarding request for home health RN consult. CM spoke to [facility]. Per Corrie Dandy, will send message to Dr. Ancil Linsey, [facility] of request for consult for home health RN services with vendor, [facility].   Expected Discharge Plan: Home w Home Health Services Barriers to Discharge: No Barriers Identified  Expected Discharge Plan and Services   Discharge Planning Services: CM Consult Post Acute Care Choice: Home Health Living arrangements for the past 2 months: Single Family Home        HH Arranged: RN, Disease Management (and medication management) HH Agency: [facility] Date [facility] Agency Contacted: 05/27/23 Time HH Agency Contacted: 1430 Representative spoke with at [facility] Agency: Elnita Maxwell   Social Determinants of Health (SDOH) Interventions SDOH Screenings   Food Insecurity: No Food Insecurity (05/23/2023)  Housing: Low Risk  (05/23/2023)  Transportation Needs: No Transportation Needs (05/23/2023)  Utilities: Not At Risk (05/23/2023)  Social Connections: Moderately Integrated (05/23/2023)  Tobacco Use: High Risk (05/23/2023)  Health Literacy: Adequate Health Literacy (04/12/2021)   [facility] from [facility]    Readmission Risk Interventions    07/23/2022    2:25 PM  Readmission Risk Prevention Plan   Transportation Screening Complete  PCP or Specialist Appt within 3-5 Days --  HRI or Home Care Consult Complete  Palliative Care Screening Not Applicable  Medication Review (RN Care Manager) Complete

## 2023-05-28 DIAGNOSIS — I2699 Other pulmonary embolism without acute cor pulmonale: Secondary | ICD-10-CM | POA: Diagnosis not present

## 2023-05-28 LAB — GLUCOSE, CAPILLARY
Glucose-Capillary: 139 mg/dL — ABNORMAL HIGH (ref 70–99)
Glucose-Capillary: 268 mg/dL — ABNORMAL HIGH (ref 70–99)
Glucose-Capillary: 133 mg/dL — ABNORMAL HIGH (ref 70–99)
Glucose-Capillary: 225 mg/dL — ABNORMAL HIGH (ref 70–99)
Glucose-Capillary: 229 mg/dL — ABNORMAL HIGH (ref 70–99)

## 2023-05-28 LAB — CBC
HCT: 29.1 % — ABNORMAL LOW (ref 39.0–52.0)
Hemoglobin: 9.9 g/dL — ABNORMAL LOW (ref 13.0–17.0)
MCH: 31.3 pg (ref 26.0–34.0)
MCHC: 34 g/dL (ref 30.0–36.0)
MCV: 92.1 fL (ref 80.0–100.0)
Platelets: 384 10*3/uL (ref 150–400)
RBC: 3.16 MIL/uL — ABNORMAL LOW (ref 4.22–5.81)
RDW: 15.5 % (ref 11.5–15.5)
WBC: 9 10*3/uL (ref 4.0–10.5)
nRBC: 0 % (ref 0.0–0.2)

## 2023-05-28 LAB — BASIC METABOLIC PANEL
Anion gap: 3 — ABNORMAL LOW (ref 5–15)
Anion gap: 10 (ref 5–15)
Anion gap: 7 (ref 5–15)
BUN: 29 mg/dL — ABNORMAL HIGH (ref 8–23)
BUN: 29 mg/dL — ABNORMAL HIGH (ref 8–23)
BUN: 28 mg/dL — ABNORMAL HIGH (ref 8–23)
CO2: 25 mmol/L (ref 22–32)
CO2: 23 mmol/L (ref 22–32)
CO2: 24 mmol/L (ref 22–32)
Calcium: 9.3 mg/dL (ref 8.9–10.3)
Calcium: 9.6 mg/dL (ref 8.9–10.3)
Calcium: 9.2 mg/dL (ref 8.9–10.3)
Chloride: 98 mmol/L (ref 98–111)
Chloride: 96 mmol/L — ABNORMAL LOW (ref 98–111)
Chloride: 97 mmol/L — ABNORMAL LOW (ref 98–111)
Creatinine, Ser: 1.44 mg/dL — ABNORMAL HIGH (ref 0.61–1.24)
Creatinine, Ser: 1.33 mg/dL — ABNORMAL HIGH (ref 0.61–1.24)
Creatinine, Ser: 1.36 mg/dL — ABNORMAL HIGH (ref 0.61–1.24)
GFR, Estimated: 53 mL/min — ABNORMAL LOW (ref >60)
GFR, Estimated: 59 mL/min — ABNORMAL LOW (ref >60)
GFR, Estimated: 57 mL/min — ABNORMAL LOW (ref >60)
Glucose, Bld: 166 mg/dL — ABNORMAL HIGH (ref 70–99)
Glucose, Bld: 133 mg/dL — ABNORMAL HIGH (ref 70–99)
Glucose, Bld: 133 mg/dL — ABNORMAL HIGH (ref 70–99)
Potassium: 5.8 mmol/L — ABNORMAL HIGH (ref 3.5–5.1)
Potassium: 5.7 mmol/L — ABNORMAL HIGH (ref 3.5–5.1)
Potassium: 5.8 mmol/L — ABNORMAL HIGH (ref 3.5–5.1)
Sodium: 126 mmol/L — ABNORMAL LOW (ref 135–145)
Sodium: 129 mmol/L — ABNORMAL LOW (ref 135–145)
Sodium: 128 mmol/L — ABNORMAL LOW (ref 135–145)

## 2023-05-28 LAB — CULTURE, BLOOD (ROUTINE X 2)
Culture: NO GROWTH
Culture: NO GROWTH
Special Requests: ADEQUATE

## 2023-05-28 LAB — CORTISOL: Cortisol, Plasma: 19 ug/dL

## 2023-05-28 LAB — MAGNESIUM: Magnesium: 1.6 mg/dL — ABNORMAL LOW (ref 1.7–2.4)

## 2023-05-28 MED ORDER — SODIUM CHLORIDE 0.9 % IV SOLN
INTRAVENOUS | Status: DC
Start: 2023-05-28 — End: 2023-05-28

## 2023-05-28 MED ORDER — SODIUM CHLORIDE 0.9 % IV SOLN: INTRAVENOUS | Status: AC

## 2023-05-28 MED ORDER — HYDROXYZINE HCL 25 MG PO TABS
25.0000 mg | ORAL_TABLET | Freq: Once | ORAL | Status: AC | PRN
  Administered 2023-05-29: 25 mg via ORAL
  Filled 2023-05-28: qty 1

## 2023-05-28 MED ORDER — SODIUM ZIRCONIUM CYCLOSILICATE 10 G PO PACK
10.0000 g | PACK | Freq: Two times a day (BID) | ORAL | Status: AC
  Administered 2023-05-28 (×2): 10 g via ORAL
  Filled 2023-05-28 (×2): qty 1

## 2023-05-28 NOTE — Plan of Care (Signed)

## 2023-05-28 NOTE — Progress Notes (Signed)
 PROGRESS NOTE    Martin Thomas  ZOX:[id number] DOB: 09-03-1955 DOA: 05/23/2023 PCP: Default, Provider, MD   Brief Narrative:  Martin Thomas is a 68 y.o. male with medical history significant for hypertension, hyperlipidemia, type 2 diabetes mellitus, lung cancer, COPD, CAD, PE in December 2024 on Eliquis, cognitive impairment, and poor adherence with his medications, now presenting with chest pain.   Patient reports waking up with chest pressure.  He denies shortness of breath or change in his chronic cough.     Patient manages his own medications.  He is not aware that he was supposed to be on a blood thinner and does not believe that he is taking one.   Assessment & Plan:   Principal Problem:   Acute pulmonary embolism (HCC) Active Problems:   Chronic HFrEF (heart failure with reduced ejection fraction) (HCC)   DM2 (diabetes mellitus, type 2) (HCC)   HTN (hypertension)   COPD (chronic obstructive pulmonary disease) (HCC)   HFrEF (heart failure with reduced ejection fraction) (HCC)   CAD (coronary artery disease)  Pulmonary embolism - has not been taking his eliquis at home-- unclear how long Started on IV heparin, changed to Xarelto. echo shows similar EF-- 35-40%.  Patient asymptomatic.  AKI: Baseline creatinine from 1-1.1, jumped to 1.44 today.  Hydrating today.  Repeat labs in the morning.   CAD; elevated troponin   - Troponin minimally elevated and decreasing in setting of acute PE, likely demand ischemia.   Likely - Continue Lipitor, Brilinta    Hypertension  - SBP 90s in ED and antihypertensives held on admission but previous hospitalist resumed metoprolol low dose due to tachycardia and midodrine was resumed as well.  Blood pressure fairly controlled.  Cortisol normal.    Type II DM  - A1c was 8.2% in December 2024.  On Jardiance and Semglee at home however here he is only on SSI and blood sugar controlled.   Chronic HFrEF/dehydration - EF was 35-40% on TTE from  December 2024  - echo similar to prior.  On clinical grounds, he appears dry.  Hydrating him for 1 L.   COPD  - Not in exacerbation  - Continue ICS-LABA and as-needed albuterol      Cognitive impairment  - Delirium precautions  - Needs help with managing his medications at home, TOC consulted    Hypomagnesemia Repleted, last checked 4 days ago.  Will check again today.   Chronic respiratory failure -wears O2 at night only   Hyperkalemia -Received lokelma x 1 yesterday but hyperkalemia worse from 5.2 yesterday to 5.7 today.  Will give him 2 more doses of Lokelma today, repeat BMP later today and tomorrow.  IV hydration will also help.  DVT prophylaxis: Xarelto   Code Status: Full Code  Family Communication:  None present at bedside.  Plan of care discussed with patient in length and he/she verbalized understanding and agreed with it.  Status is: Inpatient Remains inpatient appropriate because: AKI and hyperkalemia.   Estimated body mass index is 18.33 kg/m as calculated from the following:   Height as of this encounter: 5\' 9"  (1.753 m).   Weight as of this encounter: 56.3 kg.    Nutritional Assessment: Body mass index is 18.33 kg/m.Marland Kitchen Seen by dietician.  I agree with the assessment and plan as outlined below: Nutrition Status:        . Skin Assessment: I have examined the patient's skin and I agree with the wound assessment as performed by the wound care  RN as outlined below:    Consultants:  None  Procedures:  None  Antimicrobials:  Anti-infectives (From admission, onward)    None         Subjective: Patient seen and examined, eating breakfast in the bed.  He has no complaints.  Objective: Vitals:   05/27/23 0542 05/27/23 1224 05/27/23 2008 05/28/23 0500  BP: (!) 93/46 95/69 123/65 104/61  Pulse: 90 92 96 95  Resp: 17 18 17 18   Temp: 98.6 F (37 C) 98.1 F (36.7 C) 98.7 F (37.1 C) 98.5 F (36.9 C)  TempSrc: Oral Oral Oral Oral  SpO2: 93%   97% 98%  Weight:      Height:        Intake/Output Summary (Last 24 hours) at 05/28/2023 1027 Last data filed at 05/28/2023 0427 Gross per 24 hour  Intake 3 ml  Output 1100 ml  Net -1097 ml   Filed Weights   05/23/23 1435 05/24/23 1343  Weight: 59.9 kg 56.3 kg    Examination:  General exam: Appears calm and comfortable  Respiratory system: Clear to auscultation. Respiratory effort normal. Cardiovascular system: S1 & S2 heard, RRR. No JVD, murmurs, rubs, gallops or clicks. No pedal edema. Gastrointestinal system: Abdomen is nondistended, soft and nontender. No organomegaly or masses felt. Normal bowel sounds heard. Central nervous system: Alert and oriented. No focal neurological deficits. Extremities: Symmetric 5 x 5 power. Skin: No rashes, lesions or ulcers   Data Reviewed: I have personally reviewed following labs and imaging studies  CBC: Recent Labs  Lab 05/23/23 1502 05/24/23 0345 05/25/23 1152 05/26/23 0927 05/27/23 0517 05/28/23 0417  WBC 12.4* 11.9* 9.4 10.5 9.4 9.0  NEUTROABS 9.4*  --   --   --   --   --   HGB 11.6* 9.9* 10.3* 10.4* 9.9* 9.9*  HCT 34.1* 29.9* 30.7* 31.1* 29.3* 29.1*  MCV 93.2 93.7 92.5 94.8 93.0 92.1  PLT 432* 362 364 372 357 384   Basic Metabolic Panel: Recent Labs  Lab 05/24/23 0345 05/25/23 0559 05/26/23 0500 05/27/23 0517 05/28/23 0417 05/28/23 0849  NA 129* 128* 130* 129* 126* 129*  K 4.1 5.2* 5.0 5.3* 5.8* 5.7*  CL 105 104 103 101 98 96*  CO2 17* 15* 20* 21* 25 23  GLUCOSE 163* 148* 116* 110* 166* 133*  BUN 27* 23 22 24* 29* 29*  CREATININE 1.04 1.06 1.13 1.21 1.44* 1.33*  CALCIUM 8.4* 8.5* 8.9 8.7* 9.3 9.6  MG 1.5*  --   --   --   --   --    GFR: Estimated Creatinine Clearance: 42.9 mL/min (A) (by C-G formula based on SCr of 1.33 mg/dL (H)). Liver Function Tests: Recent Labs  Lab 05/23/23 1502  AST 39  ALT 15  ALKPHOS 173*  BILITOT 1.0  PROT 5.4*  ALBUMIN 2.3*   No results for input(s): "LIPASE", "AMYLASE" in  the last 168 hours. No results for input(s): "AMMONIA" in the last 168 hours. Coagulation Profile: Recent Labs  Lab 05/23/23 1502  INR 0.9   Cardiac Enzymes: No results for input(s): "CKTOTAL", "CKMB", "CKMBINDEX", "TROPONINI" in the last 168 hours. BNP (last 3 results) No results for input(s): "PROBNP" in the last 8760 hours. HbA1C: No results for input(s): "HGBA1C" in the last 72 hours. CBG: Recent Labs  Lab 05/27/23 0800 05/27/23 1226 05/27/23 1652 05/27/23 2107 05/28/23 0732  GLUCAP 92 191* 207* 182* 139*   Lipid Profile: No results for input(s): "CHOL", "HDL", "LDLCALC", "TRIG", "CHOLHDL", "LDLDIRECT" in  the last 72 hours. Thyroid Function Tests: No results for input(s): "TSH", "T4TOTAL", "FREET4", "T3FREE", "THYROIDAB" in the last 72 hours. Anemia Panel: No results for input(s): "VITAMINB12", "FOLATE", "FERRITIN", "TIBC", "IRON", "RETICCTPCT" in the last 72 hours. Sepsis Labs: Recent Labs  Lab 05/23/23 1509 05/23/23 1640  LATICACIDVEN 1.8 0.9    Recent Results (from the past 240 hours)  Culture, blood (Routine x 2)     Status: None   Collection Time: 05/23/23  3:02 PM   Specimen: BLOOD RIGHT ARM  Result Value Ref Range Status   Specimen Description BLOOD RIGHT ARM  Final   Special Requests   Final    BOTTLES DRAWN AEROBIC AND ANAEROBIC Blood Culture adequate volume   Culture   Final    NO GROWTH 5 DAYS Performed at [facility], [address], [place], [place] [address]    Report Status 05/28/2023 FINAL  Final  Culture, blood (Routine x 2)     Status: None   Collection Time: 05/23/23  3:02 PM   Specimen: BLOOD RIGHT HAND  Result Value Ref Range Status   Specimen Description BLOOD RIGHT HAND  Final   Special Requests   Final    BOTTLES DRAWN AEROBIC AND ANAEROBIC Blood Culture results may not be optimal due to an inadequate volume of blood received in culture bottles   Culture   Final    NO GROWTH 5 DAYS Performed at [facility],  [address], [place], [place] [address]    Report Status 05/28/2023 FINAL  Final  Resp panel by RT-PCR (RSV, Flu A&B, Covid) Anterior Nasal Swab     Status: None   Collection Time: 05/23/23  3:02 PM   Specimen: Anterior Nasal Swab  Result Value Ref Range Status   SARS Coronavirus 2 by RT PCR NEGATIVE NEGATIVE Final   Influenza A by PCR NEGATIVE NEGATIVE Final   Influenza B by PCR NEGATIVE NEGATIVE Final    Comment: (NOTE) The Xpert Xpress SARS-CoV-2/FLU/RSV plus assay is intended as an aid in the diagnosis of influenza from Nasopharyngeal swab specimens and should not be used as a sole basis for treatment. Nasal washings and aspirates are unacceptable for Xpert Xpress SARS-CoV-2/FLU/RSV testing.  Fact Sheet for Patients: BloggerCourse.com  Fact Sheet for Healthcare Providers: SeriousBroker.it  This test is not yet approved or cleared by the [place] FDA and has been authorized for detection and/or diagnosis of SARS-CoV-2 by FDA under an Emergency Use Authorization (EUA). This EUA will remain in effect (meaning this test can be used) for the duration of the COVID-19 declaration under Section 564(b)(1) of the Act, 21 U.S.C. section 360bbb-3(b)(1), unless the authorization is terminated or revoked.     Resp Syncytial Virus by PCR NEGATIVE NEGATIVE Final    Comment: (NOTE) Fact Sheet for Patients: BloggerCourse.com  Fact Sheet for Healthcare Providers: SeriousBroker.it  This test is not yet approved or cleared by the [place] FDA and has been authorized for detection and/or diagnosis of SARS-CoV-2 by FDA under an Emergency Use Authorization (EUA). This EUA will remain in effect (meaning this test can be used) for the duration of the COVID-19 declaration under Section 564(b)(1) of the Act, 21 U.S.C. section 360bbb-3(b)(1), unless the authorization is terminated  or revoked.  Performed at [facility], [address] [place], [place] [address]      Radiology Studies: No results found.  Scheduled Meds:  atorvastatin  40 mg Oral QHS   fenofibrate  160 mg  Oral Daily   gabapentin  300 mg Oral BID   insulin aspart  0-5 Units Subcutaneous QHS   insulin aspart  0-6 Units Subcutaneous TID WC   metoprolol tartrate  12.5 mg Oral BID   midodrine  5 mg Oral TID WC   mirtazapine  30 mg Oral QHS   pantoprazole  40 mg Oral Daily   Rivaroxaban  15 mg Oral BID WC   Followed by   Melene Muller ON 06/17/2023] rivaroxaban  20 mg Oral Q supper   sodium chloride flush  3 mL Intravenous Q12H   sodium zirconium cyclosilicate  10 g Oral BID   ticagrelor  90 mg Oral BID   Continuous Infusions:  sodium chloride       LOS: 5 days   Hughie Closs, MD [facility]  05/28/2023, 10:27 AM   *Please note that this is a verbal dictation therefore any spelling or grammatical errors are due to the "Dragon Medical One" system interpretation.  Please page via Amion and do not message via secure chat for urgent patient care matters. Secure chat can be used for non urgent patient care matters.  How to contact the [facility] Attending or Consulting provider 7A - 7P or covering provider during after hours 7P -7A, for this patient?  Check the care team in [facility] and look for a) attending/consulting TRH provider listed and b) the [facility] team listed. Page or secure chat 7A-7P. Log into [email or web address] and use [place]'s universal password to access. If you do not have the password, please contact the hospital operator. Locate the [facility] provider you are looking for under [facility] and page to a number that you can be directly reached. If you still have difficulty reaching the provider, please page the [facility] (Director on Call) for the Hospitalists listed on amion for assistance.

## 2023-05-28 NOTE — Evaluation (Signed)
 Physical Therapy Evaluation Patient Details Name: Martin Thomas MRN: [id number] DOB: 1955-12-02 Today's Date: 05/28/2023  History of Present Illness  Patient is 68 y.o. male presenting 05/23/23 with chest pain. Admitted with acute PE. PMH significant for lung cancer, traumatic Lt BKA, DM2, HTN, HLD, CAD/NSTEMI status post, cognitive impairment, chronic HF, poor adherence to medications, and prior PE in December 2024.  Clinical Impression  Pt is a 68 y.o. male presenting on 05/23/23 with above conditions and deficits below, see PT Problem List. PTA pt lived in a one story home with a ramped entrance with his brother and niece. The pt used a w/c for home and community navigation with the occasional assistance from his brother and niece for iADLs and ADLs. Currently pt is mod I in bed mobility and transfers. Given pt's PLOF, proximity to baseline, anticipated progress, and available family support, PT doesn't recommend follow-up PT or continued acute PT. Pt would benefit from a follow-up referral for a new prothesis as they reported they don't use their current one s/t the pain it causes. Will sign off from pt care. All pt education completed and questions answered.          If plan is discharge home, recommend the following: A little help with bathing/dressing/bathroom;Assistance with cooking/housework;Assist for transportation;Help with stairs or ramp for entrance   Can travel by private vehicle        Equipment Recommendations None recommended by PT  Recommendations for Other Services  Other (comment) (Prosthetist referral)    Functional Status Assessment Patient has not had a recent decline in their functional status     Precautions / Restrictions Precautions Precautions: Fall Recall of Precautions/Restrictions: Intact Precaution/Restrictions Comments: L BKA Restrictions Weight Bearing Restrictions Per Provider Order: No      Mobility  Bed Mobility Overal bed mobility: Modified  Independent Bed Mobility: Sit to Supine, Supine to Sit     Supine to sit: Modified independent (Device/Increase time), HOB elevated, Used rails Sit to supine: Modified independent (Device/Increase time), HOB elevated, Used rails   General bed mobility comments: Pt performs bed mobility with slightly increased time and uses rails when moving from sit to supine more than supine to sit.    Transfers Overall transfer level: Modified independent Equipment used: None Transfers: Bed to chair/wheelchair/BSC       Squat pivot transfers: Modified independent (Device/Increase time), From elevated surface     General transfer comment: Pt completes 3 squat pivot transfers, with the 3rd at home bed height (elevated EOB). Pt performed each with slightly increased time but with good eccentric control, hand placement, and speed.    Ambulation/Gait               General Gait Details: pt doesn't ambulate at baseline  Stairs            Wheelchair Mobility     Tilt Bed    Modified Rankin (Stroke Patients Only)       Balance Overall balance assessment: Modified Independent Sitting-balance support: No upper extremity supported, Feet supported Sitting balance-Leahy Scale: Normal Sitting balance - Comments: pt sits EOB and in recliner without directional lean or UE. No LOB       Standing balance comment: deferred s/t pt reporting he doesn't like to stand with RW                             Pertinent Vitals/Pain Pain Assessment Pain Assessment: No/denies pain  Home Living Family/patient expects to be discharged to:: Private residence Living Arrangements: Other relatives (brother and niece) Available Help at Discharge: Available PRN/intermittently;Family Type of Home: House Home Access: Ramped entrance       Home Layout: One level Home Equipment: Wheelchair - manual;Wheelchair - power;Grab bars - tub/shower;Crutches Additional Comments: Pt reports he  intermittently uses crutches for home navigation but not very often s/t pain in his under arms and prefers to use his wheelchairs. Reports he normally does lateral scoots to and from his w/c.    Prior Function Prior Level of Function : Independent/Modified Independent;Driving             Mobility Comments: mod I with w/c in home and community ADLs Comments: independent in most ADLs. brother helps with driving as needed. Niece does the majority of cooking     Extremity/Trunk Assessment   Upper Extremity Assessment Upper Extremity Assessment: Overall WFL for tasks assessed    Lower Extremity Assessment Lower Extremity Assessment: Generalized weakness;LLE deficits/detail LLE Deficits / Details: L BKA    Cervical / Trunk Assessment Cervical / Trunk Assessment: Kyphotic  Communication   Communication Communication: No apparent difficulties    Cognition Arousal: Alert Behavior During Therapy: WFL for tasks assessed/performed   PT - Cognitive impairments: No apparent impairments                         Following commands: Intact       Cueing Cueing Techniques: Verbal cues     General Comments General comments (skin integrity, edema, etc.): VSS throughout session    Exercises     Assessment/Plan    PT Assessment Patient does not need any further PT services  PT Problem List         PT Treatment Interventions      PT Goals (Current goals can be found in the Care Plan section)  Acute Rehab PT Goals Patient Stated Goal: return home PT Goal Formulation: All assessment and education complete, DC therapy Time For Goal Achievement: 06/11/23 Potential to Achieve Goals: Good    Frequency       Co-evaluation               AM-PAC PT "6 Clicks" Mobility  Outcome Measure Help needed turning from your back to your side while in a flat bed without using bedrails?: None Help needed moving from lying on your back to sitting on the side of a flat bed  without using bedrails?: None Help needed moving to and from a bed to a chair (including a wheelchair)?: None Help needed standing up from a chair using your arms (e.g., wheelchair or bedside chair)?: A Little Help needed to walk in hospital room?: Total Help needed climbing 3-5 steps with a railing? : Total 6 Click Score: 17    End of Session Equipment Utilized During Treatment: Gait belt Activity Tolerance: Patient tolerated treatment well Patient left: in bed;with call bell/phone within reach;with bed alarm set Nurse Communication: Mobility status PT Visit Diagnosis: Muscle weakness (generalized) (M62.81);Difficulty in walking, not elsewhere classified (R26.2)    Time: 1610-9604 PT Time Calculation (min) (ACUTE ONLY): 22 min   Charges:   PT Evaluation $PT Eval Low Complexity: 1 Low   PT General Charges $$ ACUTE PT VISIT: 1 Visit        [address], SPT   [place] 05/28/2023, 4:56 PM

## 2023-05-29 ENCOUNTER — Other Ambulatory Visit (HOSPITAL_COMMUNITY): Payer: Self-pay

## 2023-05-29 DIAGNOSIS — I2699 Other pulmonary embolism without acute cor pulmonale: Secondary | ICD-10-CM | POA: Diagnosis not present

## 2023-05-29 LAB — BASIC METABOLIC PANEL
Anion gap: 8 (ref 5–15)
BUN: 26 mg/dL — ABNORMAL HIGH (ref 8–23)
CO2: 23 mmol/L (ref 22–32)
Calcium: 8.8 mg/dL — ABNORMAL LOW (ref 8.9–10.3)
Chloride: 99 mmol/L (ref 98–111)
Creatinine, Ser: 1.37 mg/dL — ABNORMAL HIGH (ref 0.61–1.24)
GFR, Estimated: 57 mL/min — ABNORMAL LOW (ref >60)
Glucose, Bld: 99 mg/dL (ref 70–99)
Potassium: 5.1 mmol/L (ref 3.5–5.1)
Sodium: 130 mmol/L — ABNORMAL LOW (ref 135–145)

## 2023-05-29 LAB — GLUCOSE, CAPILLARY
Glucose-Capillary: 101 mg/dL — ABNORMAL HIGH (ref 70–99)
Glucose-Capillary: 184 mg/dL — ABNORMAL HIGH (ref 70–99)

## 2023-05-29 MED ORDER — RIVAROXABAN (XARELTO) VTE STARTER PACK (15 & 20 MG)
ORAL_TABLET | ORAL | 0 refills | Status: DC
Start: 2023-05-29 — End: 2023-06-23
  Filled 2023-05-29: qty 51, 30d supply, fill #0

## 2023-05-29 MED ORDER — SODIUM ZIRCONIUM CYCLOSILICATE 10 G PO PACK
10.0000 g | PACK | Freq: Once | ORAL | Status: AC
  Administered 2023-05-29: 10 g via ORAL
  Filled 2023-05-29: qty 1

## 2023-05-29 MED ORDER — MAGNESIUM SULFATE 4 GM/100ML IV SOLN
4.0000 g | Freq: Once | INTRAVENOUS | Status: AC
  Administered 2023-05-29: 4 g via INTRAVENOUS
  Filled 2023-05-29: qty 100

## 2023-05-29 MED ORDER — FUROSEMIDE 40 MG PO TABS
40.0000 mg | ORAL_TABLET | Freq: Every day | ORAL | Status: DC
Start: 2023-06-01 — End: 2023-06-23

## 2023-05-29 MED ORDER — SODIUM CHLORIDE 0.9 % IV SOLN: INTRAVENOUS | Status: DC

## 2023-05-29 NOTE — Plan of Care (Signed)
  Problem: Fluid Volume: Goal: Ability to maintain a balanced intake and output will improve Outcome: Progressing   Problem: Health Behavior/Discharge Planning: Goal: Ability to manage health-related needs will improve Outcome: Progressing   Problem: Nutritional: Goal: Maintenance of adequate nutrition will improve Outcome: Progressing   Problem: Skin Integrity: Goal: Risk for impaired skin integrity will decrease Outcome: Not Progressing

## 2023-05-29 NOTE — Discharge Summary (Signed)
 Physician Discharge Summary  Martin Thomas UJW:[id number] DOB: 09-15-1955 DOA: 05/23/2023  PCP: Default, Provider, MD  Admit date: 05/23/2023 Discharge date: 05/29/2023 30 Day Unplanned Readmission Risk Score    Flowsheet Row ED to Hosp-Admission (Current) from 05/23/2023 in [place] [facility]  30 Day Unplanned Readmission Risk Score (%) 25.62 Filed at 05/29/2023 0801       This score is the patient's risk of an unplanned readmission within 30 days of being discharged (0 -100%). The score is based on dignosis, age, lab data, medications, orders, and past utilization.   Low:  0-14.9   Medium: 15-21.9   High: 22-29.9   Extreme: 30 and above          Admitted From: Home Disposition: Home  Recommendations for Outpatient Follow-up:  Follow up with PCP in 1-2 weeks Please obtain BMP/CBC in one week Please follow up with your PCP on the following pending results: Unresulted Labs (From admission, onward)    None         Home Health: Yes Equipment/Devices: None  Discharge Condition: Stable CODE STATUS: Full code Diet recommendation: Cardiac  Subjective: Patient seen and examined, eating breakfast and bed, has no complaints.  Agreeable with discharge today.  Brief/Interim Summary: Martin Thomas is a 68 y.o. male with medical history significant for hypertension, hyperlipidemia, type 2 diabetes mellitus, lung cancer, COPD, CAD, PE in December 2024 on Eliquis, cognitive impairment, and poor adherence with his medications presented with chest pain but no shortness of breath and he was diagnosed with PE and admitted to [facility] service, details below.   Pulmonary embolism - has not been taking his eliquis at home-- unclear how long Started on IV heparin, changed to Xarelto. echo shows similar EF-- 35-40%.  Patient asymptomatic.  Discharging on Xarelto.   AKI: Baseline creatinine from 1-1.1, jumped to 1.44, given some IV fluids, improved to 1.37 which is not far from his  baseline.  He has been advised to drink plenty of fluids and we have advised him to hold nephrotoxic agents for few more days which will be Lasix.    CAD; elevated troponin   - Troponin minimally elevated and decreasing in setting of acute PE, likely demand ischemia.   Likely - Continue Lipitor, Brilinta.  Discontinuing aspirin.   Hypertension  - SBP 90s in ED and antihypertensives held on admission but previous hospitalist resumed metoprolol low dose due to tachycardia and resumed midodrine, patient's blood pressure is within normal range.  I do not believe he needs midodrine.  I have also resumed his Toprol-XL which will help with systolic congestive heart failure.  We have held his losartan and blood pressure has remained stable and his creatinine is slightly elevated so we are discontinuing losartan at discharge.    Type II DM  - A1c was 8.2% in December 2024.  Resume PTA medications.   Chronic HFrEF/dehydration - EF was 35-40% on TTE from December 2024  - echo similar to prior.  On clinical grounds, he appears close to euvolemic, he is still needs p.o. hydration which she has been advised, advised to resume Lasix in 2 to 3 days.   COPD  - Not in exacerbation.  Resume home medications.    Cognitive impairment  - Needs help with managing his medications at home, Martin Thomas consulted    Hypomagnesemia Low again yesterday.  Will get replenishment before discharge today.   Chronic respiratory failure -wears O2 at night only   Hyperkalemia -Received lokelma x 1 day  before yesterday yesterday but hyperkalemia worse from 5.2 to 5.7 yesterday, he was given 2 more doses of Lokelma, potassium 5.1.  To be cautious, I am giving him another dose of Lokelma before discharge today.   Discharge plan was discussed with patient and/or family member and they verbalized understanding and agreed with it.  Discharge Diagnoses:  Principal Problem:   Acute pulmonary embolism (HCC) Active Problems:   Chronic  HFrEF (heart failure with reduced ejection fraction) (HCC)   DM2 (diabetes mellitus, type 2) (HCC)   HTN (hypertension)   COPD (chronic obstructive pulmonary disease) (HCC)   HFrEF (heart failure with reduced ejection fraction) (HCC)   CAD (coronary artery disease)    Discharge Instructions   Allergies as of 05/29/2023       Reactions   Contrast Media [iodinated Contrast Media] Anaphylaxis   Iodine Anaphylaxis   Sulfamethoxazole-trimethoprim Nausea And Vomiting, Swelling   Other Reaction(s): Renal failure syndrome   Lisinopril Other (See Comments)   hyperkalemia Other Reaction(s): Hyperkalemia   Varenicline    Other Reaction(s): Altered mental status   Ciprofloxacin Other (See Comments)   unknown   Metformin Other (See Comments)   Increased lactic acid Other Reaction(s): Increased lactic acid level        Medication List     STOP taking these medications    aspirin EC 81 MG tablet   budesonide-formoterol 160-4.5 MCG/ACT inhaler Commonly known as: SYMBICORT   Eliquis 5 MG Tabs tablet Generic drug: apixaban   HYDROcodone-acetaminophen 5-325 MG tablet Commonly known as: NORCO/VICODIN   insulin aspart 100 UNIT/ML FlexPen Commonly known as: NOVOLOG   insulin glargine-yfgn 100 UNIT/ML Pen Commonly known as: SEMGLEE   losartan 25 MG tablet Commonly known as: COZAAR   rosuvastatin 40 MG tablet Commonly known as: CRESTOR       TAKE these medications    albuterol 108 (90 Base) MCG/ACT inhaler Commonly known as: VENTOLIN HFA Inhale 1 puff into the lungs 4 (four) times daily as needed for shortness of breath.   atorvastatin 80 MG tablet Commonly known as: LIPITOR Take 1 tablet (80 mg total) by mouth daily. What changed:  how much to take when to take this   Blood Glucose Monitor System w/Device Kit Use in the morning, at noon, and at bedtime.   BLOOD GLUCOSE TEST STRIPS Strp Use in the morning, at noon, and at bedtime.   colchicine 0.6 MG  tablet Take 1 tablet (0.6 mg total) by mouth 2 (two) times daily.   cyclobenzaprine 10 MG tablet Commonly known as: FLEXERIL Take 10 mg by mouth at bedtime.   empagliflozin 25 MG Tabs tablet Commonly known as: JARDIANCE Take 12.5 mg by mouth daily.   EVOLVE-MI evolocumab 140 mg/1 mL SQ injection Inject 1 mL (140 mg total) into the skin every 14 (fourteen) days. For Investigational Use Only. Inject subcutaneously into abdomen, thigh, or upper arm every 14 days. Rotate injection sites and do not inject into areas where skin is tender, bruised, or red. Please contact [facility] for any questions or concerns regarding this medication.   fenofibrate 145 MG tablet Commonly known as: TRICOR Take 145 mg by mouth daily.   furosemide 40 MG tablet Commonly known as: LASIX Take 1 tablet (40 mg total) by mouth daily. Start taking on: June 01, 2023 What changed: These instructions start on June 01, 2023. If you are unsure what to do until then, ask your doctor or other care provider.   gabapentin 300 MG capsule Commonly  known as: NEURONTIN Take 300 mg by mouth 2 (two) times daily.   glucose 4 GM chewable tablet Chew 4 tablets by mouth as needed for low blood sugar (blood sugar less than 70).   Insulin Pen Needle 32G X 4 MM Misc Use 4 (four) times daily -  before meals and at bedtime.   ketoconazole 2 % cream Commonly known as: NIZORAL Apply 1 Application topically daily.   magnesium oxide 400 (240 Mg) MG tablet Commonly known as: MAG-OX Take 400 mg by mouth daily.   metoprolol succinate 25 MG 24 hr tablet Commonly known as: TOPROL-XL Take 1 tablet (25 mg total) by mouth at bedtime.   mirtazapine 30 MG tablet Commonly known as: REMERON Take 30 mg by mouth at bedtime.   nitroGLYCERIN 0.4 MG SL tablet Commonly known as: NITROSTAT Place 1 tablet (0.4 mg total) under the tongue every 5 (five) minutes as needed for chest pain.   omeprazole 40 MG capsule Commonly known as:  PRILOSEC Take 40 mg by mouth daily.   Rivaroxaban Starter Pack (15 mg and 20 mg) Commonly known as: XARELTO STARTER PACK Follow package directions: Take one 15mg  tablet by mouth twice a day. On day 22, switch to one 20mg  tablet once a day. Take with food.   ticagrelor 90 MG Tabs tablet Commonly known as: BRILINTA Take 1 tablet (90 mg total) by mouth 2 (two) times daily.        Follow-up Information     Care, [facility]. Call.   Why: 03/11---start of care pending receipt of [facility] authorization. Point of contact is Delfin Edis, phone: [phone] Contact information: [address] [place] [address] [phone]         pcp Follow up in 1 week(s).                 Allergies  Allergen Reactions   Contrast Media [Iodinated Contrast Media] Anaphylaxis   Iodine Anaphylaxis   Sulfamethoxazole-Trimethoprim Nausea And Vomiting and Swelling    Other Reaction(s): Renal failure syndrome   Lisinopril Other (See Comments)    hyperkalemia  Other Reaction(s): Hyperkalemia   Varenicline     Other Reaction(s): Altered mental status   Ciprofloxacin Other (See Comments)    unknown   Metformin Other (See Comments)    Increased lactic acid  Other Reaction(s): Increased lactic acid level    Consultations: None   Procedures/Studies: ECHOCARDIOGRAM COMPLETE Result Date: 05/24/2023    ECHOCARDIOGRAM REPORT   Patient Name:   BAILY SERPE Date of Exam: 05/24/2023 Medical Rec #:  [id number]        Height:       69.0 in Accession #:    [id number]       Weight:       132.1 lb Date of Birth:  1955-07-17         BSA:          1.732 m Patient Age:    68 years         BP:           112/73 mmHg Patient Gender: M                HR:           118 bpm. Exam Location:  Inpatient Procedure: 2D Echo, Cardiac Doppler and Color Doppler (Both Spectral and Color            Flow Doppler were utilized during procedure). Indications:    Pulmonary  Embolus  History:        Patient has  prior history of Echocardiogram examinations, most                 recent 03/10/2023. Previous Myocardial Infarction and CAD; Risk                 Factors:Hypertension, Diabetes and Current Smoker.  Sonographer:    Rosaland Lao Sonographer#2:  Delcie Roch RDCS Referring Phys: [id number] TIMOTHY S OPYD IMPRESSIONS  1. Left ventricular ejection fraction, by estimation, is 35 to 40%. The left ventricle has moderately decreased function. The left ventricle has no regional wall motion abnormalities. The left ventricular internal cavity size was moderately dilated. Left ventricular diastolic parameters are indeterminate.  2. Right ventricular systolic function is normal. The right ventricular size is normal.  3. The mitral valve is abnormal. Mild mitral valve regurgitation. No evidence of mitral stenosis.  4. Mean gradient 10 mmhg but AVA 2.2 cm2 and DVI 0.63. The aortic valve was not well visualized. There is moderate calcification of the aortic valve. There is moderate thickening of the aortic valve. Aortic valve regurgitation is mild to moderate. Aortic valve sclerosis/calcification is present, without any evidence of aortic stenosis.  5. The inferior vena cava is normal in size with greater than 50% respiratory variability, suggesting right atrial pressure of 3 mmHg. FINDINGS  Left Ventricle: Left ventricular ejection fraction, by estimation, is 35 to 40%. The left ventricle has moderately decreased function. The left ventricle has no regional wall motion abnormalities. Strain was performed and the global longitudinal strain is indeterminate. The left ventricular internal cavity size was moderately dilated. There is no left ventricular hypertrophy. Left ventricular diastolic parameters are indeterminate. Right Ventricle: The right ventricular size is normal. No increase in right ventricular wall thickness. Right ventricular systolic function is normal. Left Atrium: Left atrial size was normal in size. Right  Atrium: Right atrial size was normal in size. Pericardium: There is no evidence of pericardial effusion. Mitral Valve: The mitral valve is abnormal. There is mild thickening of the mitral valve leaflet(s). There is mild calcification of the mitral valve leaflet(s). Mild mitral annular calcification. Mild mitral valve regurgitation. No evidence of mitral valve stenosis. Tricuspid Valve: The tricuspid valve is normal in structure. Tricuspid valve regurgitation is mild . No evidence of tricuspid stenosis. Aortic Valve: Mean gradient 10 mmhg but AVA 2.2 cm2 and DVI 0.63. The aortic valve was not well visualized. There is moderate calcification of the aortic valve. There is moderate thickening of the aortic valve. Aortic valve regurgitation is mild to moderate. Aortic regurgitation PHT measures 309 msec. Aortic valve sclerosis/calcification is present, without any evidence of aortic stenosis. Aortic valve mean gradient measures 10.0 mmHg. Aortic valve peak gradient measures 19.2 mmHg. Aortic valve area, by VTI measures 2.18 cm. Pulmonic Valve: The pulmonic valve was normal in structure. Pulmonic valve regurgitation is mild. No evidence of pulmonic stenosis. Aorta: The aortic root is normal in size and structure. Venous: The inferior vena cava is normal in size with greater than 50% respiratory variability, suggesting right atrial pressure of 3 mmHg. IAS/Shunts: No atrial level shunt detected by color flow Doppler. Additional Comments: 3D was performed not requiring image post processing on an independent workstation and was indeterminate.  LEFT VENTRICLE PLAX 2D LVIDd:         5.00 cm      Diastology LVIDs:         4.00 cm      LV  e' medial:    17.20 cm/s LV PW:         1.00 cm      LV E/e' medial:  9.2 LV IVS:        0.80 cm      LV e' lateral:   23.40 cm/s LVOT diam:     2.10 cm      LV E/e' lateral: 6.8 LV SV:         74 LV SV Index:   43 LVOT Area:     3.46 cm  LV Volumes (MOD) LV vol d, MOD A2C: 89.3 ml LV vol d,  MOD A4C: 110.0 ml LV vol s, MOD A2C: 58.9 ml LV vol s, MOD A4C: 69.8 ml LV SV MOD A2C:     30.4 ml LV SV MOD A4C:     110.0 ml LV SV MOD BP:      34.7 ml RIGHT VENTRICLE RV Basal diam:  2.70 cm RV S prime:     13.50 cm/s TAPSE (M-mode): 1.0 cm LEFT ATRIUM             Index        RIGHT ATRIUM           Index LA diam:        3.50 cm 2.02 cm/m   RA Area:     11.80 cm LA Vol (A2C):   33.4 ml 19.29 ml/m  RA Volume:   23.30 ml  13.45 ml/m LA Vol (A4C):   29.1 ml 16.80 ml/m LA Biplane Vol: 31.4 ml 18.13 ml/m  AORTIC VALVE AV Area (Vmax):    2.23 cm AV Area (Vmean):   2.19 cm AV Area (VTI):     2.18 cm AV Vmax:           219.00 cm/s AV Vmean:          152.000 cm/s AV VTI:            0.341 m AV Peak Grad:      19.2 mmHg AV Mean Grad:      10.0 mmHg LVOT Vmax:         141.00 cm/s LVOT Vmean:        96.300 cm/s LVOT VTI:          0.215 m LVOT/AV VTI ratio: 0.63 AI PHT:            309 msec  AORTA Ao Root diam: 3.70 cm Ao Asc diam:  3.20 cm MR Peak grad: 74.6 mmHg MR Vmax:      432.00 cm/s   SHUNTS MV E velocity: 158.00 cm/s  Systemic VTI:  0.22 m                             Systemic Diam: 2.10 cm Charlton Haws MD Electronically signed by Charlton Haws MD Signature Date/Time: 05/24/2023/11:29:45 AM    Final    NM Pulmonary Perfusion Result Date: 05/23/2023 CLINICAL DATA:  Pulmonary embolism (PE) suspected, high prob Chest pain. EXAM: NUCLEAR MEDICINE PERFUSION LUNG SCAN TECHNIQUE: Perfusion images were obtained in multiple projections after intravenous injection of radiopharmaceutical. Ventilation scans intentionally deferred if perfusion scan and chest x-ray adequate for interpretation during COVID 19 epidemic. RADIOPHARMACEUTICALS:  4 mCi Tc-57m MAA IV COMPARISON:  Chest radiograph performed same day, perfusion scan 03/10/2023 FINDINGS: Diminished perfusion in the right upper lobe, previous perfusion demonstrated segmental anomaly, currently appearing lobar. No abnormalities seen on concurrent  chest radiograph.  IMPRESSION: Diminished perfusion to the right upper lobe, which has progressed from prior perfusion scan. Findings may represent progressive pulmonary embolus since December. Electronically Signed   By: Narda Rutherford M.D.   On: 05/23/2023 19:29   DG Chest Portable 1 View Result Date: 05/23/2023 CLINICAL DATA:  Chest pain. EXAM: PORTABLE CHEST 1 VIEW COMPARISON:  03/09/2023, PET CT 11/28/2022 FINDINGS: Heart is normal in size. Stable mediastinal contours, similar right paratracheal thickening without adenopathy on prior PET. Pulmonary vasculature is normal. No consolidation, pleural effusion, or pneumothorax. Pulmonary nodules on prior PET are not seen on the current exam. On limited assessment, no acute osseous abnormalities are seen. IMPRESSION: 1. No acute chest findings. 2. Pulmonary nodules on prior PET are not seen on the current exam. Electronically Signed   By: Narda Rutherford M.D.   On: 05/23/2023 19:25     Discharge Exam: Vitals:   05/29/23 0349 05/29/23 0901  BP: 104/62 136/73  Pulse: 92 (!) 116  Resp: 18   Temp: 97.8 F (36.6 C)   SpO2: 99%    Vitals:   05/28/23 2012 05/28/23 2046 05/29/23 0349 05/29/23 0901  BP: 127/79 127/79 104/62 136/73  Pulse: 68 84 92 (!) 116  Resp: 20  18   Temp: 98.7 F (37.1 C)  97.8 F (36.6 C)   TempSrc: Oral  Oral   SpO2: 98%  99%   Weight:      Height:        General: Pt is alert, awake, not in acute distress Cardiovascular: RRR, S1/S2 +, no rubs, no gallops Respiratory: CTA bilaterally, no wheezing, no rhonchi Abdominal: Soft, NT, ND, bowel sounds + Extremities: no edema, no cyanosis    The results of significant diagnostics from this hospitalization (including imaging, microbiology, ancillary and laboratory) are listed below for reference.     Microbiology: Recent Results (from the past 240 hours)  Culture, blood (Routine x 2)     Status: None   Collection Time: 05/23/23  3:02 PM   Specimen: BLOOD RIGHT ARM  Result Value Ref  Range Status   Specimen Description BLOOD RIGHT ARM  Final   Special Requests   Final    BOTTLES DRAWN AEROBIC AND ANAEROBIC Blood Culture adequate volume   Culture   Final    NO GROWTH 5 DAYS Performed at [facility], [address] [place], [place] [address]    Report Status 05/28/2023 FINAL  Final  Culture, blood (Routine x 2)     Status: None   Collection Time: 05/23/23  3:02 PM   Specimen: BLOOD RIGHT HAND  Result Value Ref Range Status   Specimen Description BLOOD RIGHT HAND  Final   Special Requests   Final    BOTTLES DRAWN AEROBIC AND ANAEROBIC Blood Culture results may not be optimal due to an inadequate volume of blood received in culture bottles   Culture   Final    NO GROWTH 5 DAYS Performed at [facility], [address], [place], [place] [address]    Report Status 05/28/2023 FINAL  Final  Resp panel by RT-PCR (RSV, Flu A&B, Covid) Anterior Nasal Swab     Status: None   Collection Time: 05/23/23  3:02 PM   Specimen: Anterior Nasal Swab  Result Value Ref Range Status   SARS Coronavirus 2 by RT PCR NEGATIVE NEGATIVE Final   Influenza A by PCR NEGATIVE NEGATIVE Final   Influenza B by PCR NEGATIVE NEGATIVE Final  Comment: (NOTE) The Xpert Xpress SARS-CoV-2/FLU/RSV plus assay is intended as an aid in the diagnosis of influenza from Nasopharyngeal swab specimens and should not be used as a sole basis for treatment. Nasal washings and aspirates are unacceptable for Xpert Xpress SARS-CoV-2/FLU/RSV testing.  Fact Sheet for Patients: BloggerCourse.com  Fact Sheet for Healthcare Providers: SeriousBroker.it  This test is not yet approved or cleared by the [place] FDA and has been authorized for detection and/or diagnosis of SARS-CoV-2 by FDA under an Emergency Use Authorization (EUA). This EUA will remain in effect (meaning this test can be used) for the duration of the COVID-19 declaration under  Section 564(b)(1) of the Act, 21 U.S.C. section 360bbb-3(b)(1), unless the authorization is terminated or revoked.     Resp Syncytial Virus by PCR NEGATIVE NEGATIVE Final    Comment: (NOTE) Fact Sheet for Patients: BloggerCourse.com  Fact Sheet for Healthcare Providers: SeriousBroker.it  This test is not yet approved or cleared by the [place] FDA and has been authorized for detection and/or diagnosis of SARS-CoV-2 by FDA under an Emergency Use Authorization (EUA). This EUA will remain in effect (meaning this test can be used) for the duration of the COVID-19 declaration under Section 564(b)(1) of the Act, 21 U.S.C. section 360bbb-3(b)(1), unless the authorization is terminated or revoked.  Performed at [facility], [address], [place], [place] [address]      Labs: BNP (last 3 results) Recent Labs    08/04/22 1750  BNP 401.6*   Basic Metabolic Panel: Recent Labs  Lab 05/24/23 0345 05/25/23 0559 05/27/23 0517 05/28/23 0417 05/28/23 0848 05/28/23 0849 05/28/23 1815 05/29/23 0811  NA 129*   < > 129* 126*  --  129* 128* 130*  K 4.1   < > 5.3* 5.8*  --  5.7* 5.8* 5.1  CL 105   < > 101 98  --  96* 97* 99  CO2 17*   < > 21* 25  --  23 24 23   GLUCOSE 163*   < > 110* 166*  --  133* 133* 99  BUN 27*   < > 24* 29*  --  29* 28* 26*  CREATININE 1.04   < > 1.21 1.44*  --  1.33* 1.36* 1.37*  CALCIUM 8.4*   < > 8.7* 9.3  --  9.6 9.2 8.8*  MG 1.5*  --   --   --  1.6*  --   --   --    < > = values in this interval not displayed.   Liver Function Tests: Recent Labs  Lab 05/23/23 1502  AST 39  ALT 15  ALKPHOS 173*  BILITOT 1.0  PROT 5.4*  ALBUMIN 2.3*   No results for input(s): "LIPASE", "AMYLASE" in the last 168 hours. No results for input(s): "AMMONIA" in the last 168 hours. CBC: Recent Labs  Lab 05/23/23 1502 05/24/23 0345 05/25/23 1152 05/26/23 0927 05/27/23 0517 05/28/23 0417  WBC 12.4*  11.9* 9.4 10.5 9.4 9.0  NEUTROABS 9.4*  --   --   --   --   --   HGB 11.6* 9.9* 10.3* 10.4* 9.9* 9.9*  HCT 34.1* 29.9* 30.7* 31.1* 29.3* 29.1*  MCV 93.2 93.7 92.5 94.8 93.0 92.1  PLT 432* 362 364 372 357 384   Cardiac Enzymes: No results for input(s): "CKTOTAL", "CKMB", "CKMBINDEX", "TROPONINI" in the last 168 hours. BNP: Invalid input(s): "POCBNP" CBG: Recent Labs  Lab 05/28/23 1136 05/28/23 1617 05/28/23 2123 05/28/23 2311 05/29/23 0850  GLUCAP 268* 133* 225* 229* 101*   D-Dimer No results for input(s): "DDIMER" in the last 72 hours. Hgb A1c No results for input(s): "HGBA1C" in the last 72 hours. Lipid Profile No results for input(s): "CHOL", "HDL", "LDLCALC", "TRIG", "CHOLHDL", "LDLDIRECT" in the last 72 hours. Thyroid function studies No results for input(s): "TSH", "T4TOTAL", "T3FREE", "THYROIDAB" in the last 72 hours.  Invalid input(s): "FREET3" Anemia work up No results for input(s): "VITAMINB12", "FOLATE", "FERRITIN", "TIBC", "IRON", "RETICCTPCT" in the last 72 hours. Urinalysis    Component Value Date/Time   COLORURINE YELLOW 05/23/2023 1757   APPEARANCEUR CLEAR 05/23/2023 1757   LABSPEC 1.015 05/23/2023 1757   PHURINE 5.0 05/23/2023 1757   GLUCOSEU 50 (A) 05/23/2023 1757   HGBUR NEGATIVE 05/23/2023 1757   BILIRUBINUR NEGATIVE 05/23/2023 1757   KETONESUR NEGATIVE 05/23/2023 1757   PROTEINUR >=300 (A) 05/23/2023 1757   NITRITE NEGATIVE 05/23/2023 1757   LEUKOCYTESUR NEGATIVE 05/23/2023 1757   Sepsis Labs Recent Labs  Lab 05/25/23 1152 05/26/23 0927 05/27/23 0517 05/28/23 0417  WBC 9.4 10.5 9.4 9.0   Microbiology Recent Results (from the past 240 hours)  Culture, blood (Routine x 2)     Status: None   Collection Time: 05/23/23  3:02 PM   Specimen: BLOOD RIGHT ARM  Result Value Ref Range Status   Specimen Description BLOOD RIGHT ARM  Final   Special Requests   Final    BOTTLES DRAWN AEROBIC AND ANAEROBIC Blood Culture adequate volume   Culture    Final    NO GROWTH 5 DAYS Performed at [facility], [address] [place], [place] [address]    Report Status 05/28/2023 FINAL  Final  Culture, blood (Routine x 2)     Status: None   Collection Time: 05/23/23  3:02 PM   Specimen: BLOOD RIGHT HAND  Result Value Ref Range Status   Specimen Description BLOOD RIGHT HAND  Final   Special Requests   Final    BOTTLES DRAWN AEROBIC AND ANAEROBIC Blood Culture results may not be optimal due to an inadequate volume of blood received in culture bottles   Culture   Final    NO GROWTH 5 DAYS Performed at [facility], [address] [place], [place] [address]    Report Status 05/28/2023 FINAL  Final  Resp panel by RT-PCR (RSV, Flu A&B, Covid) Anterior Nasal Swab     Status: None   Collection Time: 05/23/23  3:02 PM   Specimen: Anterior Nasal Swab  Result Value Ref Range Status   SARS Coronavirus 2 by RT PCR NEGATIVE NEGATIVE Final   Influenza A by PCR NEGATIVE NEGATIVE Final   Influenza B by PCR NEGATIVE NEGATIVE Final    Comment: (NOTE) The Xpert Xpress SARS-CoV-2/FLU/RSV plus assay is intended as an aid in the diagnosis of influenza from Nasopharyngeal swab specimens and should not be used as a sole basis for treatment. Nasal washings and aspirates are unacceptable for Xpert Xpress SARS-CoV-2/FLU/RSV testing.  Fact Sheet for Patients: BloggerCourse.com  Fact Sheet for Healthcare Providers: SeriousBroker.it  This test is not yet approved or cleared by the [place] FDA and has been authorized for detection and/or diagnosis of SARS-CoV-2 by FDA under an Emergency Use Authorization (EUA). This EUA will remain in effect (meaning this test can be used) for the duration of the COVID-19 declaration under Section 564(b)(1) of the Act, 21 U.S.C. section 360bbb-3(b)(1), unless the authorization is terminated or revoked.     Resp Syncytial Virus  by PCR NEGATIVE NEGATIVE  Final    Comment: (NOTE) Fact Sheet for Patients: BloggerCourse.com  Fact Sheet for Healthcare Providers: SeriousBroker.it  This test is not yet approved or cleared by the [place] FDA and has been authorized for detection and/or diagnosis of SARS-CoV-2 by FDA under an Emergency Use Authorization (EUA). This EUA will remain in effect (meaning this test can be used) for the duration of the COVID-19 declaration under Section 564(b)(1) of the Act, 21 U.S.C. section 360bbb-3(b)(1), unless the authorization is terminated or revoked.  Performed at [facility], [address] [place], [place] [address]     FURTHER DISCHARGE INSTRUCTIONS:   Get Medicines reviewed and adjusted: Please take all your medications with you for your next visit with your Primary MD   Laboratory/radiological data: Please request your Primary MD to go over all Thomas tests and procedure/radiological results at the follow up, please ask your Primary MD to get all Thomas records sent to his/her office.   In some cases, they will be blood work, cultures and biopsy results pending at the time of your discharge. Please request that your primary care M.D. goes through all the records of your Thomas data and follows up on these results.   Also Note the following: If you experience worsening of your admission symptoms, develop shortness of breath, life threatening emergency, suicidal or homicidal thoughts you must seek medical attention immediately by calling 911 or calling your MD immediately  if symptoms less severe.   You must read complete instructions/literature along with all the possible adverse reactions/side effects for all the Medicines you take and that have been prescribed to you. Take any new Medicines after you have completely understood and accpet all the possible adverse reactions/side effects.    Do not drive when taking Pain medications  or sleeping medications (Benzodaizepines)   Do not take more than prescribed Pain, Sleep and Anxiety Medications. It is not advisable to combine anxiety,sleep and pain medications without talking with your primary care practitioner   Special Instructions: If you have smoked or chewed Tobacco  in the last 2 yrs please stop smoking, stop any regular Alcohol  and or any Recreational drug use.   Wear Seat belts while driving.   Please note: You were cared for by a hospitalist during your Thomas stay. Once you are discharged, your primary care physician will handle any further medical issues. Please note that NO REFILLS for any discharge medications will be authorized once you are discharged, as it is imperative that you return to your primary care physician (or establish a relationship with a primary care physician if you do not have one) for your post Thomas discharge needs so that they can reassess your need for medications and monitor your lab values  Time coordinating discharge: Over 30 minutes  SIGNED:   Hughie Closs, MD  [facility] 05/29/2023, 10:07 AM *Please note that this is a verbal dictation therefore any spelling or grammatical errors are due to the "Dragon Medical One" system interpretation. If 7PM-7AM, please contact night-coverage [email or web address]

## 2023-05-29 NOTE — Telephone Encounter (Signed)
 Tried to reach patient and it states call can be completed at this time x3  Tried again and it gave a busy signal x3  Left voicemail [phone]

## 2023-05-29 NOTE — TOC Transition Note (Signed)
 Transition of Care [facility]) - Discharge Note   Patient Details  Name: Martin Thomas MRN: [id number] Date of Birth: Aug 10, 1955  Transition of Care [facility]) CM/SW Contact:  Gala Lewandowsky, RN Phone Number: 05/29/2023, 10:24 AM   Clinical Narrative: Patient will transition home today with [facility] RN services via [facility]. No further needs identified.   Final next level of care: Home w Home Health Services Barriers to Discharge: No Barriers Identified   Patient Goals and CMS Choice Patient states their goals for this hospitalization and ongoing recovery are:: to return home no chest pain   Choice offered to / list presented to : Patient (no agency preference)    Discharge Plan and Services Additional resources added to the After Visit Summary for     Discharge Planning Services: CM Consult Post Acute Care Choice: Home Health            HH Arranged: RN, Disease Management (and medication management) HH Agency: [facility] Date [facility] Agency Contacted: 05/27/23 Time HH Agency Contacted: 1430 Representative spoke with at [facility] Agency: Elnita Maxwell  Social Drivers of Health (SDOH) Interventions SDOH Screenings   Food Insecurity: No Food Insecurity (05/23/2023)  Housing: Low Risk  (05/23/2023)  Transportation Needs: No Transportation Needs (05/23/2023)  Utilities: Not At Risk (05/23/2023)  Social Connections: Moderately Integrated (05/23/2023)  Tobacco Use: High Risk (05/23/2023)  Health Literacy: Adequate Health Literacy (04/12/2021)   [facility]   Readmission Risk Interventions    07/23/2022    2:25 PM  Readmission Risk Prevention Plan  Transportation Screening Complete  PCP or Specialist Appt within 3-5 Days --  HRI or Home Care Consult Complete  Palliative Care Screening Not Applicable  Medication Review (RN Care Manager) Complete

## 2023-05-29 NOTE — Progress Notes (Signed)
 Mobility Specialist Progress Note;   05/29/23 0920  Mobility  Activity Transferred from bed to chair  Level of Assistance Standby assist, set-up cues, supervision of patient - no hands on  Assistive Device Other (Comment) (Bedrails)  Activity Response Tolerated well  Mobility Referral Yes  Mobility visit 1 Mobility  Mobility Specialist Start Time (ACUTE ONLY) 0920  Mobility Specialist Stop Time (ACUTE ONLY) 0936  Mobility Specialist Time Calculation (min) (ACUTE ONLY) 16 min   Pt agreeable to mobility. Required no physical assistance during transfer from bed to chair, SV. VSS throughout. Pt left in chair with all needs met, alarm on.   Martin Thomas Mobility Specialist Please contact via SecureChat or [employer] [phone]

## 2023-06-10 NOTE — Progress Notes (Deleted)
 {This patient may be at risk for Amyloid. He has one or more dx on the problem list or PMH from the following list - Abnormal EKG, CHF, Aortic Stenosis, Proteinuria, LVH, Carpal Tunnel Syndrome, Biceps Tendon Rupture, Syncope. See list below or review PMH.  Diagnoses From Problem List           Noted     Chronic HFrEF (heart failure with reduced ejection fraction) (HCC) 08/04/2022     HFrEF (heart failure with reduced ejection fraction) (HCC) 03/11/2023    Click HERE to open Cardiac Amyloid Screening SmartSet to order screening OR Click HERE to defer testing for 1 year or permanently :1}    Cardiology Office Note:    Date:  06/10/2023  ID:  Martin Thomas, DOB October 09, 1955, MRN [id number] PCP: Default, Provider, MD  [facility] Providers Cardiologist:  Armanda Magic, MD { Click to update primary MD,subspecialty MD or APP then REFRESH:1}    {Click to Open Review  :1}   Patient Profile:      Coronary artery disease  S/p PCI to RCA in 2014 NSTEMI in 03/2018 s/p DES x 2 to RCA [facility]) Inf STEMI in 07/2022 2/2 ISR s/p 3.5 x 12 mm DES to pRCA, POBA to dRCA stent ISR, POBA to RPDA and RPAV  LHC 07/19/22: mLAD 60; dLCx 50, OM2 50; pRCA 75, mRCA stent patent, dRCA stent 100 thrombotic, RPDA 100, RPAV 100, EF 35-45 C/b pericarditis (HFrEF) heart failure with reduced ejection fraction  TTE 07/19/22: EF 45-50 TTE 03/10/23: EF 35-40 TTE 05/24/23: EF 35-40, no RWMA, NL RVSF, mild MR, mild to mod AI, AV sclerosis  Aortic insufficiency  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  History of Present Illness Martin Thomas is a 68 y.o. male who returns for  post hospital follow up.   He was admitted in 02/2023 with acute pulmonary embolism. He had elevated hsTroponins c/w demand ischemia. He developed AKI on chronic kidney disease which precluded ability to pursue cardiac catheterization. EF was 35-40 on TTE. Statin was held due to elevated LFTs. He had episodes of ATach during admission.    He was admitted 3/7-3/13 with recurrent pulmonary embolism in the setting of anticoagulation non adherence. His Eliquis was changed to Xarelto. His creatinine increased some and Lasix was held briefly. EF remained stable on TTE with EF 35-40. His hsTroponin was minimally elevated and most c/w demand ischemia. Losartan was DCd due to elevated SCr and stable BPs. K+ was increased and he was given several doses of Lokelma.    ROS-See HPI***    Studies Reviewed:       *** Results    Risk Assessment/Calculations:   {Does this patient have ATRIAL FIBRILLATION?[phone]} No BP recorded.  {Refresh Note OR Click here to enter BP  :1}***       Physical Exam:   VS:  There were no vitals taken for this visit.   Wt Readings from Last 3 Encounters:  05/24/23 124 lb 1.9 oz (56.3 kg)  03/16/23 141 lb 12.1 oz (64.3 kg)  08/04/22 184 lb (83.5 kg)    Physical Exam***     Assessment  and Plan:   Assessment & Plan Chronic HFrEF (heart failure with reduced ejection fraction) (HCC)  Coronary artery disease involving native heart without angina pectoris, unspecified vessel or lesion type  Acute pulmonary embolism without acute cor pulmonale, unspecified pulmonary embolism type (HCC)  Primary hypertension  Pure hypercholesterolemia  Status post below-knee amputation of left lower extremity (HCC)  CKD stage 3a, GFR 45-59 ml/min (HCC)  Chronic obstructive pulmonary disease, unspecified COPD type (HCC)  Hyperkalemia  Assessment and Plan Assessment & Plan    {      :1}    {Are you ordering a CV Procedure (e.g. stress test, cath, DCCV, TEE, etc)?   Press  F2        :[id number]}  Dispo:  No follow-ups on file.  Signed, Tereso Newcomer, PA-C

## 2023-06-11 ENCOUNTER — Ambulatory Visit: Admitting: Physician Assistant

## 2023-06-11 DIAGNOSIS — I10 Essential (primary) hypertension: Secondary | ICD-10-CM

## 2023-06-11 DIAGNOSIS — Z89512 Acquired absence of left leg below knee: Secondary | ICD-10-CM

## 2023-06-11 DIAGNOSIS — I2510 Atherosclerotic heart disease of native coronary artery without angina pectoris: Secondary | ICD-10-CM

## 2023-06-11 DIAGNOSIS — I2699 Other pulmonary embolism without acute cor pulmonale: Secondary | ICD-10-CM

## 2023-06-11 DIAGNOSIS — N1831 Chronic kidney disease, stage 3a: Secondary | ICD-10-CM

## 2023-06-11 DIAGNOSIS — I5022 Chronic systolic (congestive) heart failure: Secondary | ICD-10-CM

## 2023-06-11 DIAGNOSIS — E875 Hyperkalemia: Secondary | ICD-10-CM

## 2023-06-11 DIAGNOSIS — J449 Chronic obstructive pulmonary disease, unspecified: Secondary | ICD-10-CM

## 2023-06-11 DIAGNOSIS — E7800 Pure hypercholesterolemia, unspecified: Secondary | ICD-10-CM

## 2023-06-20 ENCOUNTER — Other Ambulatory Visit: Payer: Self-pay

## 2023-06-20 ENCOUNTER — Inpatient Hospital Stay (HOSPITAL_COMMUNITY)
Admission: EM | Admit: 2023-06-20 | Discharge: 2023-07-17 | DRG: 682 | Disposition: E | Attending: Internal Medicine | Admitting: Internal Medicine

## 2023-06-20 ENCOUNTER — Emergency Department (HOSPITAL_COMMUNITY)

## 2023-06-20 ENCOUNTER — Encounter (HOSPITAL_COMMUNITY): Payer: Self-pay | Admitting: Emergency Medicine

## 2023-06-20 DIAGNOSIS — E872 Acidosis, unspecified: Secondary | ICD-10-CM | POA: Diagnosis present

## 2023-06-20 DIAGNOSIS — Z8249 Family history of ischemic heart disease and other diseases of the circulatory system: Secondary | ICD-10-CM

## 2023-06-20 DIAGNOSIS — Z833 Family history of diabetes mellitus: Secondary | ICD-10-CM

## 2023-06-20 DIAGNOSIS — J449 Chronic obstructive pulmonary disease, unspecified: Secondary | ICD-10-CM | POA: Diagnosis present

## 2023-06-20 DIAGNOSIS — S81012A Laceration without foreign body, left knee, initial encounter: Secondary | ICD-10-CM | POA: Diagnosis present

## 2023-06-20 DIAGNOSIS — E1122 Type 2 diabetes mellitus with diabetic chronic kidney disease: Secondary | ICD-10-CM | POA: Diagnosis present

## 2023-06-20 DIAGNOSIS — D75839 Thrombocytosis, unspecified: Secondary | ICD-10-CM | POA: Diagnosis present

## 2023-06-20 DIAGNOSIS — I130 Hypertensive heart and chronic kidney disease with heart failure and stage 1 through stage 4 chronic kidney disease, or unspecified chronic kidney disease: Secondary | ICD-10-CM | POA: Diagnosis present

## 2023-06-20 DIAGNOSIS — F17210 Nicotine dependence, cigarettes, uncomplicated: Secondary | ICD-10-CM | POA: Diagnosis present

## 2023-06-20 DIAGNOSIS — R634 Abnormal weight loss: Secondary | ICD-10-CM | POA: Diagnosis present

## 2023-06-20 DIAGNOSIS — I10 Essential (primary) hypertension: Secondary | ICD-10-CM | POA: Diagnosis present

## 2023-06-20 DIAGNOSIS — Z681 Body mass index (BMI) 19 or less, adult: Secondary | ICD-10-CM

## 2023-06-20 DIAGNOSIS — I5022 Chronic systolic (congestive) heart failure: Secondary | ICD-10-CM | POA: Diagnosis present

## 2023-06-20 DIAGNOSIS — S81011A Laceration without foreign body, right knee, initial encounter: Secondary | ICD-10-CM | POA: Diagnosis present

## 2023-06-20 DIAGNOSIS — E7800 Pure hypercholesterolemia, unspecified: Secondary | ICD-10-CM | POA: Diagnosis present

## 2023-06-20 DIAGNOSIS — Z955 Presence of coronary angioplasty implant and graft: Secondary | ICD-10-CM

## 2023-06-20 DIAGNOSIS — R296 Repeated falls: Secondary | ICD-10-CM | POA: Diagnosis present

## 2023-06-20 DIAGNOSIS — E119 Type 2 diabetes mellitus without complications: Secondary | ICD-10-CM

## 2023-06-20 DIAGNOSIS — Z794 Long term (current) use of insulin: Secondary | ICD-10-CM

## 2023-06-20 DIAGNOSIS — C799 Secondary malignant neoplasm of unspecified site: Secondary | ICD-10-CM | POA: Diagnosis present

## 2023-06-20 DIAGNOSIS — C7A8 Other malignant neuroendocrine tumors: Secondary | ICD-10-CM | POA: Diagnosis present

## 2023-06-20 DIAGNOSIS — Y92009 Unspecified place in unspecified non-institutional (private) residence as the place of occurrence of the external cause: Secondary | ICD-10-CM

## 2023-06-20 DIAGNOSIS — C7B8 Other secondary neuroendocrine tumors: Secondary | ICD-10-CM | POA: Diagnosis present

## 2023-06-20 DIAGNOSIS — Z7902 Long term (current) use of antithrombotics/antiplatelets: Secondary | ICD-10-CM

## 2023-06-20 DIAGNOSIS — I5023 Acute on chronic systolic (congestive) heart failure: Secondary | ICD-10-CM | POA: Diagnosis present

## 2023-06-20 DIAGNOSIS — Z515 Encounter for palliative care: Secondary | ICD-10-CM

## 2023-06-20 DIAGNOSIS — Z89512 Acquired absence of left leg below knee: Secondary | ICD-10-CM

## 2023-06-20 DIAGNOSIS — Z91041 Radiographic dye allergy status: Secondary | ICD-10-CM

## 2023-06-20 DIAGNOSIS — W19XXXA Unspecified fall, initial encounter: Secondary | ICD-10-CM | POA: Insufficient documentation

## 2023-06-20 DIAGNOSIS — E871 Hypo-osmolality and hyponatremia: Secondary | ICD-10-CM | POA: Diagnosis present

## 2023-06-20 DIAGNOSIS — N179 Acute kidney failure, unspecified: Principal | ICD-10-CM | POA: Diagnosis present

## 2023-06-20 DIAGNOSIS — S51011A Laceration without foreign body of right elbow, initial encounter: Secondary | ICD-10-CM | POA: Diagnosis present

## 2023-06-20 DIAGNOSIS — N1831 Chronic kidney disease, stage 3a: Secondary | ICD-10-CM | POA: Diagnosis present

## 2023-06-20 DIAGNOSIS — J910 Malignant pleural effusion: Secondary | ICD-10-CM | POA: Diagnosis present

## 2023-06-20 DIAGNOSIS — Z7984 Long term (current) use of oral hypoglycemic drugs: Secondary | ICD-10-CM

## 2023-06-20 DIAGNOSIS — I2510 Atherosclerotic heart disease of native coronary artery without angina pectoris: Secondary | ICD-10-CM | POA: Diagnosis present

## 2023-06-20 DIAGNOSIS — J9601 Acute respiratory failure with hypoxia: Secondary | ICD-10-CM | POA: Insufficient documentation

## 2023-06-20 DIAGNOSIS — D62 Acute posthemorrhagic anemia: Secondary | ICD-10-CM | POA: Insufficient documentation

## 2023-06-20 DIAGNOSIS — Z881 Allergy status to other antibiotic agents status: Secondary | ICD-10-CM

## 2023-06-20 DIAGNOSIS — S0101XA Laceration without foreign body of scalp, initial encounter: Secondary | ICD-10-CM | POA: Insufficient documentation

## 2023-06-20 DIAGNOSIS — Z86711 Personal history of pulmonary embolism: Secondary | ICD-10-CM

## 2023-06-20 DIAGNOSIS — C787 Secondary malignant neoplasm of liver and intrahepatic bile duct: Secondary | ICD-10-CM | POA: Insufficient documentation

## 2023-06-20 DIAGNOSIS — W06XXXA Fall from bed, initial encounter: Secondary | ICD-10-CM | POA: Diagnosis present

## 2023-06-20 DIAGNOSIS — R410 Disorientation, unspecified: Secondary | ICD-10-CM | POA: Insufficient documentation

## 2023-06-20 DIAGNOSIS — Z85118 Personal history of other malignant neoplasm of bronchus and lung: Secondary | ICD-10-CM

## 2023-06-20 DIAGNOSIS — G8929 Other chronic pain: Secondary | ICD-10-CM | POA: Diagnosis present

## 2023-06-20 DIAGNOSIS — Z993 Dependence on wheelchair: Secondary | ICD-10-CM

## 2023-06-20 DIAGNOSIS — Z7901 Long term (current) use of anticoagulants: Secondary | ICD-10-CM

## 2023-06-20 DIAGNOSIS — R54 Age-related physical debility: Secondary | ICD-10-CM | POA: Diagnosis present

## 2023-06-20 DIAGNOSIS — Z9185 Personal history of military service: Secondary | ICD-10-CM

## 2023-06-20 DIAGNOSIS — Z823 Family history of stroke: Secondary | ICD-10-CM

## 2023-06-20 DIAGNOSIS — Z66 Do not resuscitate: Secondary | ICD-10-CM | POA: Diagnosis present

## 2023-06-20 DIAGNOSIS — J9602 Acute respiratory failure with hypercapnia: Secondary | ICD-10-CM | POA: Diagnosis present

## 2023-06-20 DIAGNOSIS — C7A1 Malignant poorly differentiated neuroendocrine tumors: Secondary | ICD-10-CM | POA: Insufficient documentation

## 2023-06-20 DIAGNOSIS — Z888 Allergy status to other drugs, medicaments and biological substances status: Secondary | ICD-10-CM

## 2023-06-20 DIAGNOSIS — Z89519 Acquired absence of unspecified leg below knee: Secondary | ICD-10-CM

## 2023-06-20 DIAGNOSIS — S61412A Laceration without foreign body of left hand, initial encounter: Secondary | ICD-10-CM | POA: Diagnosis present

## 2023-06-20 DIAGNOSIS — C3490 Malignant neoplasm of unspecified part of unspecified bronchus or lung: Secondary | ICD-10-CM | POA: Insufficient documentation

## 2023-06-20 DIAGNOSIS — J90 Pleural effusion, not elsewhere classified: Secondary | ICD-10-CM | POA: Insufficient documentation

## 2023-06-20 DIAGNOSIS — Z79899 Other long term (current) drug therapy: Secondary | ICD-10-CM

## 2023-06-20 DIAGNOSIS — Z882 Allergy status to sulfonamides status: Secondary | ICD-10-CM

## 2023-06-20 LAB — I-STAT CHEM 8, ED
BUN: 52 mg/dL — ABNORMAL HIGH (ref 8–23)
Calcium, Ion: 1.13 mmol/L — ABNORMAL LOW (ref 1.15–1.40)
Chloride: 97 mmol/L — ABNORMAL LOW (ref 98–111)
Creatinine, Ser: 3.2 mg/dL — ABNORMAL HIGH (ref 0.61–1.24)
Glucose, Bld: 133 mg/dL — ABNORMAL HIGH (ref 70–99)
HCT: 22 % — ABNORMAL LOW (ref 39.0–52.0)
Hemoglobin: 7.5 g/dL — ABNORMAL LOW (ref 13.0–17.0)
Potassium: 5.8 mmol/L — ABNORMAL HIGH (ref 3.5–5.1)
Sodium: 124 mmol/L — ABNORMAL LOW (ref 135–145)
TCO2: 21 mmol/L — ABNORMAL LOW (ref 22–32)

## 2023-06-20 LAB — CBC WITH DIFFERENTIAL/PLATELET
Abs Immature Granulocytes: 0.19 10*3/uL — ABNORMAL HIGH (ref 0.00–0.07)
Basophils Absolute: 0 10*3/uL (ref 0.0–0.1)
Basophils Relative: 0 %
Eosinophils Absolute: 0 10*3/uL (ref 0.0–0.5)
Eosinophils Relative: 0 %
HCT: 21.4 % — ABNORMAL LOW (ref 39.0–52.0)
Hemoglobin: 7.4 g/dL — ABNORMAL LOW (ref 13.0–17.0)
Immature Granulocytes: 1 %
Lymphocytes Relative: 5 %
Lymphs Abs: 0.8 10*3/uL (ref 0.7–4.0)
MCH: 31.6 pg (ref 26.0–34.0)
MCHC: 34.6 g/dL (ref 30.0–36.0)
MCV: 91.5 fL (ref 80.0–100.0)
Monocytes Absolute: 1.1 10*3/uL — ABNORMAL HIGH (ref 0.1–1.0)
Monocytes Relative: 7 %
Neutro Abs: 13.4 10*3/uL — ABNORMAL HIGH (ref 1.7–7.7)
Neutrophils Relative %: 87 %
Platelets: 505 10*3/uL — ABNORMAL HIGH (ref 150–400)
RBC: 2.34 MIL/uL — ABNORMAL LOW (ref 4.22–5.81)
RDW: 17.8 % — ABNORMAL HIGH (ref 11.5–15.5)
WBC: 15.6 10*3/uL — ABNORMAL HIGH (ref 4.0–10.5)
nRBC: 0.2 % (ref 0.0–0.2)

## 2023-06-20 LAB — BASIC METABOLIC PANEL WITH GFR
Anion gap: 12 (ref 5–15)
BUN: 55 mg/dL — ABNORMAL HIGH (ref 8–23)
CO2: 19 mmol/L — ABNORMAL LOW (ref 22–32)
Calcium: 8.7 mg/dL — ABNORMAL LOW (ref 8.9–10.3)
Chloride: 100 mmol/L (ref 98–111)
Creatinine, Ser: 2.56 mg/dL — ABNORMAL HIGH (ref 0.61–1.24)
GFR, Estimated: 27 mL/min — ABNORMAL LOW (ref >60)
Glucose, Bld: 173 mg/dL — ABNORMAL HIGH (ref 70–99)
Potassium: 4.7 mmol/L (ref 3.5–5.1)
Sodium: 131 mmol/L — ABNORMAL LOW (ref 135–145)

## 2023-06-20 LAB — SAMPLE TO BLOOD BANK

## 2023-06-20 LAB — COMPREHENSIVE METABOLIC PANEL WITH GFR
ALT: 15 U/L (ref 0–44)
AST: 63 U/L — ABNORMAL HIGH (ref 15–41)
Albumin: 2.1 g/dL — ABNORMAL LOW (ref 3.5–5.0)
Alkaline Phosphatase: 173 U/L — ABNORMAL HIGH (ref 38–126)
Anion gap: 12 (ref 5–15)
BUN: 59 mg/dL — ABNORMAL HIGH (ref 8–23)
CO2: 21 mmol/L — ABNORMAL LOW (ref 22–32)
Calcium: 9.3 mg/dL (ref 8.9–10.3)
Chloride: 93 mmol/L — ABNORMAL LOW (ref 98–111)
Creatinine, Ser: 2.78 mg/dL — ABNORMAL HIGH (ref 0.61–1.24)
GFR, Estimated: 24 mL/min — ABNORMAL LOW (ref >60)
Glucose, Bld: 135 mg/dL — ABNORMAL HIGH (ref 70–99)
Potassium: 5.7 mmol/L — ABNORMAL HIGH (ref 3.5–5.1)
Sodium: 126 mmol/L — ABNORMAL LOW (ref 135–145)
Total Bilirubin: 0.5 mg/dL (ref 0.0–1.2)
Total Protein: 5.7 g/dL — ABNORMAL LOW (ref 6.5–8.1)

## 2023-06-20 LAB — URINALYSIS, COMPLETE (UACMP) WITH MICROSCOPIC
Bacteria, UA: NONE SEEN
Bilirubin Urine: NEGATIVE
Glucose, UA: >=500 mg/dL — AB
Hgb urine dipstick: NEGATIVE
Ketones, ur: NEGATIVE mg/dL
Leukocytes,Ua: NEGATIVE
Nitrite: NEGATIVE
Protein, ur: 30 mg/dL — AB
Specific Gravity, Urine: 1.006 (ref 1.005–1.030)
pH: 5 (ref 5.0–8.0)

## 2023-06-20 LAB — PROTIME-INR
INR: 1.5 — ABNORMAL HIGH (ref 0.8–1.2)
Prothrombin Time: 18.4 s — ABNORMAL HIGH (ref 11.4–15.2)

## 2023-06-20 LAB — CBG MONITORING, ED: Glucose-Capillary: 107 mg/dL — ABNORMAL HIGH (ref 70–99)

## 2023-06-20 LAB — CREATININE, URINE, RANDOM: Creatinine, Urine: 30 mg/dL

## 2023-06-20 LAB — CK: Total CK: 74 U/L (ref 49–397)

## 2023-06-20 LAB — SODIUM, URINE, RANDOM: Sodium, Ur: <10 mmol/L

## 2023-06-20 LAB — ETHANOL: Alcohol, Ethyl (B): <10 mg/dL (ref <10)

## 2023-06-20 LAB — I-STAT CG4 LACTIC ACID, ED: Lactic Acid, Venous: 1.8 mmol/L (ref 0.5–1.9)

## 2023-06-20 LAB — GLUCOSE, CAPILLARY: Glucose-Capillary: 184 mg/dL — ABNORMAL HIGH (ref 70–99)

## 2023-06-20 MED ORDER — MIRTAZAPINE 15 MG PO TABS
30.0000 mg | ORAL_TABLET | Freq: Every day | ORAL | Status: DC
  Administered 2023-06-20 – 2023-06-22 (×3): 30 mg via ORAL
  Filled 2023-06-20 (×3): qty 2

## 2023-06-20 MED ORDER — ACETAMINOPHEN 650 MG RE SUPP: 650.0000 mg | Freq: Four times a day (QID) | RECTAL | Status: DC | PRN

## 2023-06-20 MED ORDER — ATORVASTATIN CALCIUM 40 MG PO TABS
40.0000 mg | ORAL_TABLET | Freq: Every day | ORAL | Status: DC
  Administered 2023-06-20 – 2023-06-22 (×3): 40 mg via ORAL
  Filled 2023-06-20 (×3): qty 1

## 2023-06-20 MED ORDER — METOPROLOL SUCCINATE ER 25 MG PO TB24
25.0000 mg | ORAL_TABLET | Freq: Every day | ORAL | Status: DC
  Administered 2023-06-20 – 2023-06-22 (×3): 25 mg via ORAL
  Filled 2023-06-20 (×3): qty 1

## 2023-06-20 MED ORDER — SODIUM CHLORIDE 0.9 % IV BOLUS
1000.0000 mL | Freq: Once | INTRAVENOUS | Status: AC
Start: 2023-06-20 — End: 2023-06-20
  Administered 2023-06-20: 1000 mL via INTRAVENOUS

## 2023-06-20 MED ORDER — BACITRACIN ZINC 500 UNIT/GM EX OINT
TOPICAL_OINTMENT | Freq: Two times a day (BID) | CUTANEOUS | Status: DC
  Administered 2023-06-20: 1 via TOPICAL
  Administered 2023-06-20 – 2023-06-22 (×4): 31.5 via TOPICAL
  Filled 2023-06-20: qty 0.9
  Filled 2023-06-20: qty 1.8
  Filled 2023-06-20: qty 28.4

## 2023-06-20 MED ORDER — SODIUM CHLORIDE 0.9 % IV BOLUS
250.0000 mL | Freq: Once | INTRAVENOUS | Status: AC
  Administered 2023-06-20: 250 mL via INTRAVENOUS

## 2023-06-20 MED ORDER — SODIUM CHLORIDE 0.9 % IV SOLN: INTRAVENOUS | Status: AC

## 2023-06-20 MED ORDER — ALBUTEROL SULFATE (2.5 MG/3ML) 0.083% IN NEBU: 2.5000 mg | INHALATION_SOLUTION | Freq: Four times a day (QID) | RESPIRATORY_TRACT | Status: DC | PRN

## 2023-06-20 MED ORDER — ACETAMINOPHEN 325 MG PO TABS
650.0000 mg | ORAL_TABLET | Freq: Four times a day (QID) | ORAL | Status: DC | PRN
  Administered 2023-06-20: 650 mg via ORAL
  Filled 2023-06-20 (×2): qty 2

## 2023-06-20 NOTE — ED Notes (Signed)
 Pt noted to have active bleeding from skin tear on top of head. Dressing applied. Bleeding controlled.

## 2023-06-20 NOTE — Evaluation (Signed)
 Physical Therapy Evaluation Patient Details Name: Martin Thomas MRN: [id number] DOB: 02-09-1956 Today's Date: 06/20/2023  History of Present Illness  68 y.o. male presenting to ED 06/20/23 after fall at home with AKI. PMHx; lung CA, traumatic Lt BKA, T2DM, HTN, HLD, CAD/NSTEMI, cognitive impairment, chronic HF, poor adherence to medications, PE  Clinical Impression  Pt pleasant and reports he lives with family transfers to [facility] on his own and performs ADLs without assist. Pt tried to reach for an object at 3am because he didn't want to wake family and fell. Discussed safety concerns with pt of needing to ask for assist or wait for items out of BOS due to LOB. Pt aware and states understanding. Pt mobility at or very near baseline but difficult to simulate fully with ED stretcher and standard visitor chair. If pt admtted will follow acutely for safety with transfers but do not anticipate therapy needs at D/C with pt aware and agreeable.     BP 105/62 (77) HR 102 100% on RA      If plan is discharge home, recommend the following: Assistance with cooking/housework;Assist for transportation   Can travel by private vehicle        Equipment Recommendations None recommended by PT  Recommendations for Other Services       Functional Status Assessment Patient has had a recent decline in their functional status and/or demonstrates limited ability to make significant improvements in function in a reasonable and predictable amount of time     Precautions / Restrictions Precautions Precautions: Fall      Mobility  Bed Mobility Overal bed mobility: Needs Assistance Bed Mobility: Supine to Sit, Sit to Supine, Rolling Rolling: Modified independent (Device/Increase time)   Supine to sit: Contact guard Sit to supine: Contact guard assist   General bed mobility comments: pt able to transition to side of strecher and back, limited ability to bridge to adjust pad, rolling with use of rail     Transfers Overall transfer level: Needs assistance   Transfers: Bed to chair/wheelchair/BSC       Squat pivot transfers: Contact guard assist     General transfer comment: pt able to sit on left side of bed and pivot to and from chair placed on left side, CGA for safety due to height difference in stretcher and chair    Ambulation/Gait                  Stairs            Wheelchair Mobility     Tilt Bed    Modified Rankin (Stroke Patients Only)       Balance Overall balance assessment: Mild deficits observed, not formally tested                                           Pertinent Vitals/Pain Pain Assessment Pain Assessment: 0-10 Pain Score: 4  Pain Location: left hip Pain Descriptors / Indicators: Aching, Guarding Pain Intervention(s): Limited activity within patient's tolerance    Home Living Family/patient expects to be discharged to:: Private residence Living Arrangements: Other relatives Available Help at Discharge: Family;Available 24 hours/day Type of Home: Mobile home Home Access: Ramped entrance       Home Layout: One level Home Equipment: Wheelchair - manual;Wheelchair - power;Grab bars - tub/shower;Crutches;Shower seat      Prior Function Prior Level of Function : Needs  assist             Mobility Comments: mod I with w/c in home and community ADLs Comments: independent in most ADLs. brother helps with driving as needed. Niece does the majority of cooking     Extremity/Trunk Assessment   Upper Extremity Assessment Upper Extremity Assessment: Generalized weakness    Lower Extremity Assessment Lower Extremity Assessment: Generalized weakness    Cervical / Trunk Assessment Cervical / Trunk Assessment: Normal  Communication   Communication Communication: No apparent difficulties    Cognition Arousal: Alert Behavior During Therapy: Flat affect   PT - Cognitive impairments: No apparent  impairments                         Following commands: Intact       Cueing Cueing Techniques: Verbal cues     General Comments      Exercises     Assessment/Plan    PT Assessment Patient needs continued PT services  PT Problem List Decreased strength;Decreased activity tolerance;Decreased balance       PT Treatment Interventions DME instruction;Functional mobility training;Therapeutic activities;Patient/family education;Therapeutic exercise    PT Goals (Current goals can be found in the Care Plan section)  Acute Rehab PT Goals Patient Stated Goal: return home PT Goal Formulation: With patient Time For Goal Achievement: 07/04/23 Potential to Achieve Goals: Fair    Frequency Min 1X/week     Co-evaluation               AM-PAC PT "6 Clicks" Mobility  Outcome Measure Help needed turning from your back to your side while in a flat bed without using bedrails?: None Help needed moving from lying on your back to sitting on the side of a flat bed without using bedrails?: A Little Help needed moving to and from a bed to a chair (including a wheelchair)?: A Little Help needed standing up from a chair using your arms (e.g., wheelchair or bedside chair)?: Total Help needed to walk in hospital room?: Total Help needed climbing 3-5 steps with a railing? : Total 6 Click Score: 13    End of Session   Activity Tolerance: Patient tolerated treatment well Patient left: in bed;with call bell/phone within reach;with nursing/sitter in room Nurse Communication: Mobility status PT Visit Diagnosis: Other abnormalities of gait and mobility (R26.89);Muscle weakness (generalized) (M62.81)    Time: 1210-1220 PT Time Calculation (min) (ACUTE ONLY): 10 min   Charges:   PT Evaluation $PT Eval Low Complexity: 1 Low   PT General Charges $$ ACUTE PT VISIT: 1 Visit         Merryl Hacker, PT Acute Rehabilitation Services Office: [phone]   Enedina Finner Chamille Werntz 06/20/2023,  12:40 PM

## 2023-06-20 NOTE — Progress Notes (Signed)
 Orthopedic Tech Progress Note Patient Details:  Martin Thomas [birth year] [id number]  Patient ID: [name], male   DOB: June 03, 1955, 68 y.o.   MRN: [id number] Checked in for level 2 trauma.  Blase Mess 06/20/2023, 4:15 AM

## 2023-06-20 NOTE — ED Notes (Signed)
 Pt asking for head of bed to be lowered. Pt Aox4.. pt repositioned and given pillow

## 2023-06-20 NOTE — Progress Notes (Signed)
  Carryover admission to the Day Admitter.  I discussed this case with the EDP, Roxy Horseman, PA.  Per these discussions:   This is a 68 year old male who initially presented as a level 2 trauma after falling out of bed attempting to get something off of his nightstand, and hitting his head on the floor as a result of this fall.  He is on Xarelto as treatment of recently diagnosed pulmonary embolism at time of hospitalization in March 2025.  No loss of consciousness.  CT head this evening showed no evidence of acute intracranial process, including no evidence of intracranial bleed.  Labs this evening reflect acute kidney injury, with presenting creatinine 2.78 compared to most recent prior creatinine value of 1.37 when checked a few weeks ago.  Labs also reflect interval anemia.  Per my discussions with EDP, the patient appears clinically dry.   He has a history of chronic systolic heart failure, with most recent echocardiogram reflecting LVEF 35 to 40%.  The patient received a 1 L IV fluid bolus this evening.  I have placed an order for observation for further evaluation management of acute kidney injury.  I have placed some additional preliminary admit orders via the adult multi-morbid admission order set. I have also ordered urinalysis, random urine sodium, random urine creatinine, add on CPK level.  I will defer additional decision making regarding additional IV fluids to the admitting hospitalist.    Newton Pigg, DO Hospitalist

## 2023-06-20 NOTE — ED Notes (Signed)
 Pt sat up in bed and pulse ox cord was wrapped around his IV and it pulled the IV out. Unsuccessful attempt made to start another. IV team order placed.

## 2023-06-20 NOTE — Progress Notes (Signed)
 Trauma Response Nurse Documentation   Martin Thomas is a 68 y.o. male arriving to [facility] ED via EMS  On Xarelto (rivaroxaban) daily. Trauma was activated as a Level 2 by ED charge RN based on the following trauma criteria Elderly patients > 65 with head trauma on anti-coagulation (excluding ASA).  Patient cleared for CT by [facility] EDP. Pt transported to CT with trauma response nurse present to monitor. RN remained with the patient throughout their absence from the department for clinical observation.   GCS 15.   History   Past Medical History:  Diagnosis Date   Cancer (HCC)    lung cancer   COPD (chronic obstructive pulmonary disease) (HCC)    Diabetes mellitus without complication (HCC)    Hyperlipemia    Hypertension      Past Surgical History:  Procedure Laterality Date   APPENDECTOMY     BELOW KNEE LEG AMPUTATION  2009   left   CORONARY STENT INTERVENTION N/A 07/19/2022   Procedure: CORONARY STENT INTERVENTION;  Surgeon: Corky Crafts, MD;  Location: [facility] INVASIVE CV LAB;  Service: Cardiovascular;  Laterality: N/A;  RCA   CORONARY STENT PLACEMENT     CORONARY THROMBECTOMY N/A 07/19/2022   Procedure: Coronary Thrombectomy;  Surgeon: Corky Crafts, MD;  Location: [facility] INVASIVE CV LAB;  Service: Cardiovascular;  Laterality: N/A;  RCA   LEFT HEART CATH AND CORONARY ANGIOGRAPHY N/A 07/19/2022   Procedure: LEFT HEART CATH AND CORONARY ANGIOGRAPHY;  Surgeon: Corky Crafts, MD;  Location: [facility] INVASIVE CV LAB;  Service: Cardiovascular;  Laterality: N/A;   LEFT HEART CATHETERIZATION WITH CORONARY ANGIOGRAM N/A 03/24/2012   Procedure: LEFT HEART CATHETERIZATION WITH CORONARY ANGIOGRAM;  Surgeon: Marykay Lex, MD;  Location: [facility] CATH LAB;  Service: Cardiovascular;  Laterality: N/A;   PERCUTANEOUS CORONARY STENT INTERVENTION (PCI-S)  03/24/2012   Procedure: PERCUTANEOUS CORONARY STENT INTERVENTION (PCI-S);  Surgeon: Marykay Lex, MD;  Location: [facility] CATH LAB;  Service:  Cardiovascular;;       Initial Focused Assessment (If applicable, or please see trauma documentation): Alert/oriented male presents via EMS from home after a fall out of bed with head injury. On xarelto. Skin tears to bilateral knees, left wrist, right elbow, and the top of his head, bleeding controlled.   Airway patent, BS clear Bleeding controlled to skin tears GCS 15   CT's Completed:   CT Head and CT C-Spine   Interventions:  IV start and trauma lab draw Portable chest and pelvis XRAY CT head and cervical spine NS bolus Plan for disposition:  Admit  Consults completed:  Hospitalist paged at [phone]  Event Summary: Presents via EMS from home after a ground level fall. Reports no LOC, skin tears to right elbow, left wrist, head and bilateral knees. Complains of chronic left hip pain.AKI on labs, trauma scans unremarkable for acute injury. Admit to hospitalist.  MTP Summary (If applicable): NA  Bedside handoff with ED RN Kipp Brood.    Carmelite Violet O Areebah Meinders  Trauma Response RN  Please call TRN at [phone] for further assistance.

## 2023-06-20 NOTE — Progress Notes (Signed)
 Transition of Care [facility]) - CAGE-AID Screening   Patient Details  Name: Martin Thomas MRN: [id number] Date of Birth: 01-11-1956  Transition [facility]) CM/SW Contact:    Katha Hamming, RN Phone Number: 06/20/2023, 6:25 AM   Clinical Narrative:  Reports occasional marijuana use  CAGE-AID Screening:    Have You Ever Felt You Ought to Cut Down on Your Drinking or Drug Use?: No Have People Annoyed You By Critizing Your Drinking Or Drug Use?: No Have You Felt Bad Or Guilty About Your Drinking Or Drug Use?: No Have You Ever Had a Drink or Used Drugs First Thing In The Morning to Steady Your Nerves or to Get Rid of a Hangover?: No CAGE-AID Score: 0  Substance Abuse Education Offered: No

## 2023-06-20 NOTE — ED Notes (Signed)
 Howerter MD notified of pt blood pressure using epic messenger. Pt is A&O x4, Head pain 4/10. No change in mentation, Respirations even and unlabored. (Vitals in Chart)

## 2023-06-20 NOTE — ED Notes (Signed)
 Pt is Alert and oriented x4, Respirations even and unlabored, Skin tears to the Right elbow, Left wrist and Scalp, Cleaned and bacitracin ointment applied to skin tears (SEE MAR), Wounds wrapped in gauze and bandaged.
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
## 2023-06-20 NOTE — ED Triage Notes (Signed)
 Level 2 Fall on Blood Thinners - Xarelto   Pt BIB [facility] from home. Pt was reaching over to grab something on a dresser when he fell. Presents with skin tear to top of his head, skin tear to right elbow, skin tear left  hand, skin tear r knee, and skin tear l knee. No LOC.

## 2023-06-20 NOTE — ED Provider Notes (Signed)
 [facility] Emergency Department Provider Note MRN:  [id number]  Arrival date & time: 06/20/23     Chief Complaint   Fall   History of Present Illness   Martin Thomas is a 68 y.o. year-old male presents to the ED with chief complaint of fall.  He is brought in by EMS as a level 2 trauma fall on thinners.  Patient takes Xarelto.  He states that he was leaning out of his bed to get something and fell out of bed.  He bumped his head on a dresser.  He denies having any pain.  He states that he has several old skin tears and wounds from recent falls and injuries.  He states that he is losing weight.  He complains of chronic left hip pain, but states that this is not new and he does not attribute it to the fall tonight.  History provided by patient.   Review of Systems  Pertinent positive and negative review of systems noted in HPI.    Physical Exam   Vitals:   06/20/23 0500 06/20/23 0515  BP: (!) 98/57 94/61  Pulse: (!) 110 (!) 110  Resp: 15 18  Temp:    SpO2: 94% 95%    CONSTITUTIONAL:  pale-appearing, NAD NEURO:  Alert and oriented x 3, CN 3-12 grossly intact EYES:  eyes equal and reactive ENT/NECK:  Supple, no stridor  CARDIO:  tachycardic, regular rhythm, appears well-perfused  PULM:  No respiratory distress, CTAB GI/GU:  non-distended,  MSK/SPINE:  No gross deformities, no edema, moves all extremities, left BKA SKIN:  old skin tears on bilateral forearms, new skin tear to right proximal forearm, abrasion to scalp, minor abrasion to left knee   *Additional and/or pertinent findings included in MDM below  Diagnostic and Interventional Summary    EKG Interpretation Date/Time:    Ventricular Rate:    PR Interval:    QRS Duration:    QT Interval:    QTC Calculation:   R Axis:      Text Interpretation:         Labs Reviewed  CBC WITH DIFFERENTIAL/PLATELET - Abnormal; Notable for the following components:      Result Value   WBC 15.6  (*)    RBC 2.34 (*)    Hemoglobin 7.4 (*)    HCT 21.4 (*)    RDW 17.8 (*)    Platelets 505 (*)    Neutro Abs 13.4 (*)    Monocytes Absolute 1.1 (*)    Abs Immature Granulocytes 0.19 (*)    All other components within normal limits  COMPREHENSIVE METABOLIC PANEL WITH GFR - Abnormal; Notable for the following components:   Sodium 126 (*)    Potassium 5.7 (*)    Chloride 93 (*)    CO2 21 (*)    Glucose, Bld 135 (*)    BUN 59 (*)    Creatinine, Ser 2.78 (*)    Total Protein 5.7 (*)    Albumin 2.1 (*)    AST 63 (*)    Alkaline Phosphatase 173 (*)    GFR, Estimated 24 (*)    All other components within normal limits  PROTIME-INR - Abnormal; Notable for the following components:   Prothrombin Time 18.4 (*)    INR 1.5 (*)    All other components within normal limits  CBG MONITORING, ED - Abnormal; Notable for the following components:   Glucose-Capillary 107 (*)    All other components within normal limits  I-STAT CHEM 8, ED - Abnormal; Notable for the following components:   Sodium 124 (*)    Potassium 5.8 (*)    Chloride 97 (*)    BUN 52 (*)    Creatinine, Ser 3.20 (*)    Glucose, Bld 133 (*)    Calcium, Ion 1.13 (*)    TCO2 21 (*)    Hemoglobin 7.5 (*)    HCT 22.0 (*)    All other components within normal limits  ETHANOL  CK  URINALYSIS, COMPLETE (UACMP) WITH MICROSCOPIC  SODIUM, URINE, RANDOM  CREATININE, URINE, RANDOM  I-STAT CG4 LACTIC ACID, ED  SAMPLE TO BLOOD BANK    CT HEAD WO CONTRAST  Final Result    CT CERVICAL SPINE WO CONTRAST  Final Result    DG Pelvis Portable  Final Result    DG Chest Port 1 View  Final Result      Medications  bacitracin ointment ( Topical Given 06/20/23 0530)  acetaminophen (TYLENOL) tablet 650 mg (has no administration in time range)    Or  acetaminophen (TYLENOL) suppository 650 mg (has no administration in time range)  sodium chloride 0.9 % bolus 1,000 mL (1,000 mLs Intravenous New Bag/Given 06/20/23 0418)      Procedures  /  Critical Care Procedures  ED Course and Medical Decision Making  I have reviewed the triage vital signs, the nursing notes, and pertinent available records from the EMR.  Social Determinants Affecting Complexity of Care: Patient has no clinically significant social determinants affecting this chief complaint..   ED Course:    Medical Decision Making Patient brought in by EMS as a level 2 trauma from home.  Patient fell out of bed and hit his head.  He is on Xarelto for PE.  On my exam, patient denies any complaints.  He denies having any pain.  He states that he has had several skin tears over the past couple weeks and has had bleeding.  He states that he has some chronic pain in his hip.  Otherwise, he states that he feels well.  Labs are notable for new AKI.  He is also hyponatremic.  Patient given fluids (1 L normal saline).  He will likely need additional IV hydration, but caution will be needed given his EF of 35 to 40%.  CT head and cervical spine are negative for intracranial hemorrhage, skull fracture, or spine fracture.  His skin tears and abrasions are dressed.  Will consult hospitalist for admission for AKI.  Amount and/or Complexity of Data Reviewed Labs: ordered. Radiology: ordered. ECG/medicine tests: ordered.  Risk OTC drugs. Decision regarding hospitalization.         Consultants: I consulted with Hospitalist, Dr. Arlean Hopping, who is appreciated for admitting.   Treatment and Plan: Patient's exam and diagnostic results are concerning for AKI.  Feel that patient will need admission to the hospital for further treatment and evaluation.    Final Clinical Impressions(s) / ED Diagnoses     ICD-10-CM   1. AKI (acute kidney injury) (HCC)  N17.9     2. Fall, initial encounter  W19.[facility]       ED Discharge Orders     None         Discharge Instructions Discussed with and Provided to Patient:   Discharge Instructions   None       Yasin, Ducat, PA-C 06/20/23 0601    Shon Baton, MD 07/12/2023 [phone]

## 2023-06-20 NOTE — ED Notes (Signed)
 Head skin tear redressed and bandaged due to continued bleeding.

## 2023-06-20 NOTE — ED Notes (Signed)
 Physical therapy at bedside, bedding changed.. IV team at bedside

## 2023-06-20 NOTE — ED Notes (Signed)
 Pt O2 dropped to 86-88% on RA. Pt placed on 2L [place].

## 2023-06-21 ENCOUNTER — Observation Stay (HOSPITAL_COMMUNITY)

## 2023-06-21 DIAGNOSIS — C7B8 Other secondary neuroendocrine tumors: Secondary | ICD-10-CM | POA: Diagnosis present

## 2023-06-21 DIAGNOSIS — C7989 Secondary malignant neoplasm of other specified sites: Secondary | ICD-10-CM | POA: Diagnosis not present

## 2023-06-21 DIAGNOSIS — E7800 Pure hypercholesterolemia, unspecified: Secondary | ICD-10-CM | POA: Diagnosis present

## 2023-06-21 DIAGNOSIS — D62 Acute posthemorrhagic anemia: Secondary | ICD-10-CM | POA: Diagnosis present

## 2023-06-21 DIAGNOSIS — R634 Abnormal weight loss: Secondary | ICD-10-CM | POA: Diagnosis present

## 2023-06-21 DIAGNOSIS — J9602 Acute respiratory failure with hypercapnia: Secondary | ICD-10-CM | POA: Diagnosis present

## 2023-06-21 DIAGNOSIS — F17210 Nicotine dependence, cigarettes, uncomplicated: Secondary | ICD-10-CM | POA: Diagnosis present

## 2023-06-21 DIAGNOSIS — Z515 Encounter for palliative care: Secondary | ICD-10-CM

## 2023-06-21 DIAGNOSIS — J449 Chronic obstructive pulmonary disease, unspecified: Secondary | ICD-10-CM | POA: Diagnosis present

## 2023-06-21 DIAGNOSIS — I130 Hypertensive heart and chronic kidney disease with heart failure and stage 1 through stage 4 chronic kidney disease, or unspecified chronic kidney disease: Secondary | ICD-10-CM | POA: Diagnosis present

## 2023-06-21 DIAGNOSIS — E1122 Type 2 diabetes mellitus with diabetic chronic kidney disease: Secondary | ICD-10-CM | POA: Diagnosis present

## 2023-06-21 DIAGNOSIS — I5022 Chronic systolic (congestive) heart failure: Secondary | ICD-10-CM | POA: Diagnosis not present

## 2023-06-21 DIAGNOSIS — C7A1 Malignant poorly differentiated neuroendocrine tumors: Secondary | ICD-10-CM | POA: Diagnosis present

## 2023-06-21 DIAGNOSIS — Z681 Body mass index (BMI) 19 or less, adult: Secondary | ICD-10-CM | POA: Diagnosis not present

## 2023-06-21 DIAGNOSIS — N179 Acute kidney failure, unspecified: Secondary | ICD-10-CM

## 2023-06-21 DIAGNOSIS — N1831 Chronic kidney disease, stage 3a: Secondary | ICD-10-CM | POA: Diagnosis present

## 2023-06-21 DIAGNOSIS — Y92009 Unspecified place in unspecified non-institutional (private) residence as the place of occurrence of the external cause: Secondary | ICD-10-CM | POA: Diagnosis not present

## 2023-06-21 DIAGNOSIS — J9601 Acute respiratory failure with hypoxia: Secondary | ICD-10-CM | POA: Diagnosis present

## 2023-06-21 DIAGNOSIS — Z794 Long term (current) use of insulin: Secondary | ICD-10-CM | POA: Diagnosis not present

## 2023-06-21 DIAGNOSIS — J910 Malignant pleural effusion: Secondary | ICD-10-CM | POA: Diagnosis present

## 2023-06-21 DIAGNOSIS — I5023 Acute on chronic systolic (congestive) heart failure: Secondary | ICD-10-CM | POA: Diagnosis present

## 2023-06-21 DIAGNOSIS — W06XXXA Fall from bed, initial encounter: Secondary | ICD-10-CM | POA: Diagnosis present

## 2023-06-21 DIAGNOSIS — E872 Acidosis, unspecified: Secondary | ICD-10-CM | POA: Diagnosis present

## 2023-06-21 DIAGNOSIS — G8929 Other chronic pain: Secondary | ICD-10-CM | POA: Diagnosis present

## 2023-06-21 DIAGNOSIS — E871 Hypo-osmolality and hyponatremia: Secondary | ICD-10-CM | POA: Diagnosis present

## 2023-06-21 DIAGNOSIS — C7A8 Other malignant neuroendocrine tumors: Secondary | ICD-10-CM | POA: Diagnosis present

## 2023-06-21 DIAGNOSIS — Z89512 Acquired absence of left leg below knee: Secondary | ICD-10-CM | POA: Diagnosis not present

## 2023-06-21 DIAGNOSIS — Z66 Do not resuscitate: Secondary | ICD-10-CM | POA: Diagnosis present

## 2023-06-21 LAB — CBC
HCT: 18.6 % — ABNORMAL LOW (ref 39.0–52.0)
Hemoglobin: 6.3 g/dL — CL (ref 13.0–17.0)
MCH: 31.7 pg (ref 26.0–34.0)
MCHC: 33.9 g/dL (ref 30.0–36.0)
MCV: 93.5 fL (ref 80.0–100.0)
Platelets: 418 10*3/uL — ABNORMAL HIGH (ref 150–400)
RBC: 1.99 MIL/uL — ABNORMAL LOW (ref 4.22–5.81)
RDW: 18.5 % — ABNORMAL HIGH (ref 11.5–15.5)
WBC: 11.3 10*3/uL — ABNORMAL HIGH (ref 4.0–10.5)
nRBC: 0.2 % (ref 0.0–0.2)

## 2023-06-21 LAB — IRON AND TIBC
Iron: 39 ug/dL — ABNORMAL LOW (ref 45–182)
Saturation Ratios: 14 % — ABNORMAL LOW (ref 17.9–39.5)
TIBC: 281 ug/dL (ref 250–450)
UIBC: 242 ug/dL

## 2023-06-21 LAB — RETICULOCYTES
Immature Retic Fract: 31 % — ABNORMAL HIGH (ref 2.3–15.9)
RBC.: 1.88 MIL/uL — ABNORMAL LOW (ref 4.22–5.81)
Retic Count, Absolute: 51.9 10*3/uL (ref 19.0–186.0)
Retic Ct Pct: 2.8 % (ref 0.4–3.1)

## 2023-06-21 LAB — PHOSPHORUS: Phosphorus: 3.3 mg/dL (ref 2.5–4.6)

## 2023-06-21 LAB — PROTEIN, TOTAL: Total Protein: 5.1 g/dL — ABNORMAL LOW (ref 6.5–8.1)

## 2023-06-21 LAB — PREPARE RBC (CROSSMATCH)

## 2023-06-21 LAB — GLUCOSE, CAPILLARY: Glucose-Capillary: 249 mg/dL — ABNORMAL HIGH (ref 70–99)

## 2023-06-21 LAB — ABO/RH: ABO/RH(D): O POS

## 2023-06-21 LAB — MAGNESIUM: Magnesium: 2.3 mg/dL (ref 1.7–2.4)

## 2023-06-21 LAB — FOLATE: Folate: 7 ng/mL (ref >5.9)

## 2023-06-21 LAB — LACTATE DEHYDROGENASE: LDH: 301 U/L — ABNORMAL HIGH (ref 98–192)

## 2023-06-21 LAB — VITAMIN B12: Vitamin B-12: 1154 pg/mL — ABNORMAL HIGH (ref 180–914)

## 2023-06-21 LAB — BRAIN NATRIURETIC PEPTIDE: B Natriuretic Peptide: 585.3 pg/mL — ABNORMAL HIGH (ref 0.0–100.0)

## 2023-06-21 LAB — FERRITIN: Ferritin: 235 ng/mL (ref 24–336)

## 2023-06-21 MED ORDER — SODIUM CHLORIDE 0.9% IV SOLUTION: Freq: Once | INTRAVENOUS | Status: AC

## 2023-06-21 MED ORDER — SODIUM CHLORIDE 0.9% IV SOLUTION: Freq: Once | INTRAVENOUS | Status: DC

## 2023-06-21 MED ORDER — MIDODRINE HCL 5 MG PO TABS
10.0000 mg | ORAL_TABLET | Freq: Three times a day (TID) | ORAL | Status: DC
  Administered 2023-06-21 – 2023-06-22 (×4): 10 mg via ORAL
  Filled 2023-06-21 (×4): qty 2

## 2023-06-21 MED ORDER — OXYCODONE-ACETAMINOPHEN 5-325 MG PO TABS
1.0000 | ORAL_TABLET | ORAL | Status: DC | PRN
  Administered 2023-06-21 – 2023-06-22 (×4): 1 via ORAL
  Filled 2023-06-21 (×4): qty 1

## 2023-06-21 MED ORDER — INSULIN ASPART 100 UNIT/ML IJ SOLN
0.0000 [IU] | Freq: Three times a day (TID) | INTRAMUSCULAR | Status: DC
  Administered 2023-06-22: 2 [IU] via SUBCUTANEOUS
  Administered 2023-06-22: 1 [IU] via SUBCUTANEOUS

## 2023-06-21 NOTE — Consult Note (Signed)
 Palliative Medicine  Name: Martin Thomas Date: 06/21/2023 MRN: [id number]  DOB: [birth year]  Patient Care Team: Default, Provider, MD as PCP - General Quintella Reichert, MD as PCP - Cardiology (Cardiology)    REASON FOR CONSULTATION: Martin Thomas is a 68 y.o. male with multiple medical problems including CAD status post stenting, HFrEF with EF 35 to 40%, history of PE on Eliquis, CKD 3A, DM, cognitive impairment, left BKA in 2009.  History of lung mass found on PET scan in September 2024.  Patient hospitalized 05/23/2023 to 05/29/2023 with PE after not consistently taking his Eliquis.  He was rotated to Xarelto.  Patient readmitted 06/20/22 after a fall.  CT of the cervical spine noted progressed malignant appearing lymphadenopathy to the thoracic duct, lower neck, and superior mediastinum.  This led to CT of the chest with note of bulky mediastinal lymphadenopathy, right lower lobe and right upper lobe lung masses, diffuse septal and interstitial thickening bilaterally concerning for possible lymphangitic spread, and multiple hepatic masses.  Palliative care is consulted to address goals.  SOCIAL HISTORY:     reports that he has been smoking cigarettes. He does not have any smokeless tobacco history on file. He reports current drug use. Drug: Marijuana. He reports that he does not drink alcohol.  Patient is unmarried and does not have children.  Lives at home with a brother and niece.  Patient is a veteran serving in the [employer].  He later worked as a Soil scientist.  ADVANCE DIRECTIVES:  Not on file  CODE STATUS: DNR  PAST MEDICAL HISTORY: Past Medical History:  Diagnosis Date   Cancer (HCC)    lung cancer   COPD (chronic obstructive pulmonary disease) (HCC)    Diabetes mellitus without complication (HCC)    Hyperlipemia    Hypertension     PAST SURGICAL HISTORY:  Past Surgical History:  Procedure Laterality Date   APPENDECTOMY     BELOW KNEE LEG  AMPUTATION  2009   left   CORONARY STENT INTERVENTION N/A 07/19/2022   Procedure: CORONARY STENT INTERVENTION;  Surgeon: Corky Crafts, MD;  Location: [facility] INVASIVE CV LAB;  Service: Cardiovascular;  Laterality: N/A;  RCA   CORONARY STENT PLACEMENT     CORONARY THROMBECTOMY N/A 07/19/2022   Procedure: Coronary Thrombectomy;  Surgeon: Corky Crafts, MD;  Location: [facility] INVASIVE CV LAB;  Service: Cardiovascular;  Laterality: N/A;  RCA   LEFT HEART CATH AND CORONARY ANGIOGRAPHY N/A 07/19/2022   Procedure: LEFT HEART CATH AND CORONARY ANGIOGRAPHY;  Surgeon: Corky Crafts, MD;  Location: [facility] INVASIVE CV LAB;  Service: Cardiovascular;  Laterality: N/A;   LEFT HEART CATHETERIZATION WITH CORONARY ANGIOGRAM N/A 03/24/2012   Procedure: LEFT HEART CATHETERIZATION WITH CORONARY ANGIOGRAM;  Surgeon: Marykay Lex, MD;  Location: [facility] CATH LAB;  Service: Cardiovascular;  Laterality: N/A;   PERCUTANEOUS CORONARY STENT INTERVENTION (PCI-S)  03/24/2012   Procedure: PERCUTANEOUS CORONARY STENT INTERVENTION (PCI-S);  Surgeon: Marykay Lex, MD;  Location: [facility] CATH LAB;  Service: Cardiovascular;;    HEMATOLOGY/ONCOLOGY HISTORY:  Oncology History   No history exists.    ALLERGIES:  is allergic to contrast media [iodinated contrast media], iodine, sulfamethoxazole-trimethoprim, lisinopril, varenicline, ciprofloxacin, and metformin.  MEDICATIONS:  Current Facility-Administered Medications  Medication Dose Route Frequency Provider Last Rate Last Admin   0.9 %  sodium chloride infusion (Manually program via Guardrails IV Fluids)   Intravenous Once Elgergawy, Leana Roe, MD       acetaminophen (TYLENOL)  tablet 650 mg  650 mg Oral Q6H PRN Howerter, Justin B, DO   650 mg at 06/20/23 2302   Or   acetaminophen (TYLENOL) suppository 650 mg  650 mg Rectal Q6H PRN Howerter, Justin B, DO       albuterol (PROVENTIL) (2.5 MG/3ML) 0.083% nebulizer solution 2.5 mg  2.5 mg Inhalation QID PRN Pokhrel, Laxman, MD        atorvastatin (LIPITOR) tablet 40 mg  40 mg Oral QHS Pokhrel, Laxman, MD   40 mg at 06/20/23 2255   bacitracin ointment   Topical BID Malikai, Gut, PA-C   Given at 06/21/23 1610   insulin aspart (novoLOG) injection 0-6 Units  0-6 Units Subcutaneous TID WC Elgergawy, Leana Roe, MD       metoprolol succinate (TOPROL-XL) 24 hr tablet 25 mg  25 mg Oral QHS Pokhrel, Laxman, MD   25 mg at 06/20/23 2255   mirtazapine (REMERON) tablet 30 mg  30 mg Oral QHS Pokhrel, Laxman, MD   30 mg at 06/20/23 2255    VITAL SIGNS: BP (!) 102/57 (BP Location: Right Arm)   Pulse (!) 121   Temp 97.8 F (36.6 C) (Oral)   Resp 19   Ht 5\' 9"  (1.753 m)   Wt 129 lb 10.1 oz (58.8 kg)   SpO2 95%   BMI 19.14 kg/m  Filed Weights   06/20/23 0530 06/20/23 2259 06/21/23 0500  Weight: 149 lb 11.1 oz (67.9 kg) 130 lb 1.1 oz (59 kg) 129 lb 10.1 oz (58.8 kg)    Estimated body mass index is 19.14 kg/m as calculated from the following:   Height as of this encounter: 5\' 9"  (1.753 m).   Weight as of this encounter: 129 lb 10.1 oz (58.8 kg).  LABS: CBC:    Component Value Date/Time   WBC 15.6 (H) 06/20/2023 0418   HGB 7.4 (L) 06/20/2023 0418   HCT 21.4 (L) 06/20/2023 0418   PLT 505 (H) 06/20/2023 0418   MCV 91.5 06/20/2023 0418   NEUTROABS 13.4 (H) 06/20/2023 0418   LYMPHSABS 0.8 06/20/2023 0418   MONOABS 1.1 (H) 06/20/2023 0418   EOSABS 0.0 06/20/2023 0418   BASOSABS 0.0 06/20/2023 0418   Comprehensive Metabolic Panel:    Component Value Date/Time   NA 131 (L) 06/20/2023 1719   K 4.7 06/20/2023 1719   CL 100 06/20/2023 1719   CO2 19 (L) 06/20/2023 1719   BUN 55 (H) 06/20/2023 1719   CREATININE 2.56 (H) 06/20/2023 1719   GLUCOSE 173 (H) 06/20/2023 1719   CALCIUM 8.7 (L) 06/20/2023 1719   AST 63 (H) 06/20/2023 0418   ALT 15 06/20/2023 0418   ALKPHOS 173 (H) 06/20/2023 0418   BILITOT 0.5 06/20/2023 0418   PROT 5.7 (L) 06/20/2023 0418   ALBUMIN 2.1 (L) 06/20/2023 0418    RADIOGRAPHIC STUDIES: CT CHEST  WO CONTRAST Result Date: 06/21/2023 CLINICAL DATA:  Pleural effusion. Lymphadenopathy. * Tracking Code: BO * EXAM: CT CHEST WITHOUT CONTRAST TECHNIQUE: Multidetector CT imaging of the chest was performed following the standard protocol without IV contrast. RADIATION DOSE REDUCTION: This exam was performed according to the departmental dose-optimization program which includes automated exposure control, adjustment of the mA and/or kV according to patient size and/or use of iterative reconstruction technique. COMPARISON:  None Available. FINDINGS: Cardiovascular: The heart size is normal. No substantial pericardial effusion. Coronary artery calcification is evident. Moderate atherosclerotic calcification is noted in the wall of the thoracic aorta. Mediastinum/Nodes: Bulky mediastinal lymphadenopathy evident including 2.4 cm  short axis anterior mediastinal node. 2.5 cm short axis right paratracheal node evident on 88/5. Pre-vascular lymphadenopathy evident including 18 mm short axis node on 124/5. Soft tissue fullness in the hilar regions suggest lymphadenopathy although not well evaluated given lack of intravenous contrast material. The esophagus has normal imaging features. There is no axillary lymphadenopathy. Lungs/Pleura: Diffuse septal thickening with 2.2 x 1.5 cm right upper lobe nodular opacity on 34/4. Circumferential bronchial wall thickening is noted bilaterally. 2.5 x 2.5 cm lobular right lower lobe nodule identified on image 112/4. 9 mm left lower lobe pulmonary nodule identified on 92/4. Small to moderate right pleural effusion with small left pleural effusion. Upper Abdomen: Multiple ill-defined low-density lesions are seen scattered throughout both hepatic lobes including 2.4 cm left hepatic lesion on 133/3 in 2.6 cm right hepatic lobe lesion on 134/3. Gallbladder appears decompressed. 2.1 cm right adrenal nodule visible on 151/3. Musculoskeletal: No worrisome lytic or sclerotic osseous abnormality.  IMPRESSION: 1. Bulky mediastinal lymphadenopathy with soft tissue fullness in the hilar regions suggesting lymphadenopathy although not well evaluated given lack of intravenous contrast material. Imaging features are compatible with metastatic disease. 2. 2.5 x 2.5 cm lobular right lower lobe nodule. This could certainly represent primary bronchogenic neoplasm although metastatic disease not excluded. 3. 2.2 x 1.5 cm right upper lobe nodular opacity with tiny left lower lobe 9 mm pulmonary nodule. Imaging features are compatible with metastatic disease. 4. Diffuse septal and interstitial thickening in both lungs. Potentially pulmonary edema, lymphangitic tumor spread not excluded. 5. Small to moderate right pleural effusion with small left pleural effusion. 6. Multiple ill-defined low-density lesions scattered throughout both hepatic lobes, highly suspicious for metastatic disease. 7. 2.1 cm right adrenal nodule.  Metastatic disease not excluded. 8.  Aortic Atherosclerois (ICD10-170.0) Electronically Signed   By: Kennith Center M.D.   On: 06/21/2023 09:50   CT CERVICAL SPINE WO CONTRAST Result Date: 06/20/2023 CLINICAL DATA:  68 year old male status post fall at home. Top of head skin tear. On blood thinners. EXAM: CT CERVICAL SPINE WITHOUT CONTRAST TECHNIQUE: Multidetector CT imaging of the cervical spine was performed without intravenous contrast. Multiplanar CT image reconstructions were also generated. RADIATION DOSE REDUCTION: This exam was performed according to the departmental dose-optimization program which includes automated exposure control, adjustment of the mA and/or kV according to patient size and/or use of iterative reconstruction technique. COMPARISON:  Head CT today. PET-CT 11/28/2022 FINDINGS: Alignment: Straightening of cervical lordosis. Cervicothoracic junction alignment is within normal limits. Bilateral posterior element alignment is within normal limits. Skull base and vertebrae: Bone  mineralization is within normal limits for age. Visualized skull base is intact. No atlanto-occipital dissociation. C1 and C2 appear intact and aligned. No acute osseous abnormality identified. Soft tissues and spinal canal: No prevertebral fluid or swelling. No visible canal hematoma. Negative visible noncontrast upper neck soft tissues aside from calcified carotid atherosclerosis. Abnormal lower neck described below. Disc levels: Mostly age-appropriate cervical spine degeneration. Somewhat bulky C5-C6 and C6-C7 disc and endplate degeneration. Possible mild spinal stenosis at the latter. Upper chest: Small layering pleural effusions in both lung apices, fluid density appears to be simple. Progressed and more infiltrative appearing abnormal right paratracheal superior mediastinal soft tissue (compare series 3, image 96 today to prior PET-CT series 202, image 48). Visible nodal conglomeration there 4 cm. Additional increased right paratracheal lymph nodes at the thoracic inlet are 13 mm individually (were only 3-4 mm in 2024). And there is new bulky lymphadenopathy at the left level 4, thoracic duct  region also (16 mm short axis there is 3 image 79). Septal thickening in the lung apices. Partially visible right peripheral, spiculated opacity on series 6, image 95, not significantly changed on these images. IMPRESSION: 1. Since 11/28/2022 PET-CT progressed and malignant appearing Lymphadenopathy at the thoracic duct, lower neck, and visible superior mediastinum. New layering pleural effusions in the lung apices. 2.  No acute traumatic injury identified in the cervical spine. Electronically Signed   By: Odessa Fleming M.D.   On: 06/20/2023 04:51   DG Chest Port 1 View Result Date: 06/20/2023 CLINICAL DATA:  68 year old male status post fall at home. Top of head skin tear. On blood thinners. Portable chest 05/23/2023. EXAM: PORTABLE CHEST 1 VIEW COMPARISON:  PET-CT 11/28/2022. FINDINGS: Portable AP view at 0420 hours. Subtle  but increased superior mediastinal soft tissue density in with when compared to remote chest x-rays 2024, 2014. Heart size remains normal. Calcified aortic atherosclerosis. Visualized tracheal air column is within normal limits. Lower lung volumes compared to last month. No pneumothorax, pleural effusion or confluent lung opacity. Coarse, asymmetric, upper lung predominant interstitial markings are stable. No acute osseous abnormality identified. Negative visible bowel gas. IMPRESSION: 1. Subtle but suspicious increased width of the superior mediastinum when compared to remote exams. In conjunction with 2024 PET-CT recommend non emergent follow-up Chest CT to exclude progression of mediastinal lymphadenopathy. 2. No superimposed acute lung opacity or acute traumatic injury identified. Electronically Signed   By: Odessa Fleming M.D.   On: 06/20/2023 04:45   DG Pelvis Portable Result Date: 06/20/2023 CLINICAL DATA:  68 year old male status post fall at home. Top of head skin tear. On blood thinners. EXAM: PORTABLE PELVIS 1-2 VIEWS COMPARISON:  CT Abdomen and Pelvis 03/09/2023. FINDINGS: Portable AP view at 0421 hours. Bone mineralization is within normal limits for age. Femoral heads appear normally located. Pelvis appears stable and intact. SI joints and symphysis appear normal. Grossly intact visible proximal femurs. Iliofemoral calcified atherosclerosis. Nonobstructed bowel-gas pattern. IMPRESSION: No acute fracture or dislocation identified about the pelvis. Electronically Signed   By: Odessa Fleming M.D.   On: 06/20/2023 04:42   CT HEAD WO CONTRAST Result Date: 06/20/2023 CLINICAL DATA:  68 year old male status post fall at home. Top of head skin tear. On blood thinners. EXAM: CT HEAD WITHOUT CONTRAST TECHNIQUE: Contiguous axial images were obtained from the base of the skull through the vertex without intravenous contrast. RADIATION DOSE REDUCTION: This exam was performed according to the departmental dose-optimization  program which includes automated exposure control, adjustment of the mA and/or kV according to patient size and/or use of iterative reconstruction technique. COMPARISON:  PET-CT 11/28/2022. FINDINGS: Brain: Cerebral volume is probably normal for age. No midline shift, ventriculomegaly, mass effect, evidence of mass lesion, intracranial hemorrhage or evidence of cortically based acute infarction. Small but circumscribed hypodense lacunar infarcts in the left thalamus and right basal ganglia appear chronic on series 2, image 18. Patchy additional mostly periventricular white matter hypodensity is moderate for age. No cortical encephalomalacia identified. Vascular: Calcified atherosclerosis at the skull base. No suspicious intracranial vascular hyperdensity. Skull: No fracture identified. Bone mineralization is within normal limits for age. Sinuses/Orbits: Visualized paranasal sinuses and mastoids are clear. Other: Anterior vertex scalp soft tissue dressing in place. No discrete hematoma. No soft tissue gas identified. Visualized orbit soft tissues are within normal limits. IMPRESSION: 1. Scalp vertex dressing material in place. No discrete traumatic injury identified. 2. No acute intracranial abnormality. Moderate for age chronic small vessel disease. Electronically Signed  By: Odessa Fleming M.D.   On: 06/20/2023 04:40   ECHOCARDIOGRAM COMPLETE Result Date: 05/24/2023    ECHOCARDIOGRAM REPORT   Patient Name:   RAIF CHACHERE Date of Exam: 05/24/2023 Medical Rec #:  [id number]        Height:       69.0 in Accession #:    [id number]       Weight:       132.1 lb Date of Birth:  18-Jul-1955         BSA:          1.732 m Patient Age:    68 years         BP:           112/73 mmHg Patient Gender: M                HR:           118 bpm. Exam Location:  Inpatient Procedure: 2D Echo, Cardiac Doppler and Color Doppler (Both Spectral and Color            Flow Doppler were utilized during procedure). Indications:    Pulmonary Embolus   History:        Patient has prior history of Echocardiogram examinations, most                 recent 03/10/2023. Previous Myocardial Infarction and CAD; Risk                 Factors:Hypertension, Diabetes and Current Smoker.  Sonographer:    Rosaland Lao Sonographer#2:  Delcie Roch RDCS Referring Phys: [id number] TIMOTHY S OPYD IMPRESSIONS  1. Left ventricular ejection fraction, by estimation, is 35 to 40%. The left ventricle has moderately decreased function. The left ventricle has no regional wall motion abnormalities. The left ventricular internal cavity size was moderately dilated. Left ventricular diastolic parameters are indeterminate.  2. Right ventricular systolic function is normal. The right ventricular size is normal.  3. The mitral valve is abnormal. Mild mitral valve regurgitation. No evidence of mitral stenosis.  4. Mean gradient 10 mmhg but AVA 2.2 cm2 and DVI 0.63. The aortic valve was not well visualized. There is moderate calcification of the aortic valve. There is moderate thickening of the aortic valve. Aortic valve regurgitation is mild to moderate. Aortic valve sclerosis/calcification is present, without any evidence of aortic stenosis.  5. The inferior vena cava is normal in size with greater than 50% respiratory variability, suggesting right atrial pressure of 3 mmHg. FINDINGS  Left Ventricle: Left ventricular ejection fraction, by estimation, is 35 to 40%. The left ventricle has moderately decreased function. The left ventricle has no regional wall motion abnormalities. Strain was performed and the global longitudinal strain is indeterminate. The left ventricular internal cavity size was moderately dilated. There is no left ventricular hypertrophy. Left ventricular diastolic parameters are indeterminate. Right Ventricle: The right ventricular size is normal. No increase in right ventricular wall thickness. Right ventricular systolic function is normal. Left Atrium: Left atrial size was  normal in size. Right Atrium: Right atrial size was normal in size. Pericardium: There is no evidence of pericardial effusion. Mitral Valve: The mitral valve is abnormal. There is mild thickening of the mitral valve leaflet(s). There is mild calcification of the mitral valve leaflet(s). Mild mitral annular calcification. Mild mitral valve regurgitation. No evidence of mitral valve stenosis. Tricuspid Valve: The tricuspid valve is normal in structure. Tricuspid valve regurgitation is mild . No evidence of tricuspid stenosis. Aortic  Valve: Mean gradient 10 mmhg but AVA 2.2 cm2 and DVI 0.63. The aortic valve was not well visualized. There is moderate calcification of the aortic valve. There is moderate thickening of the aortic valve. Aortic valve regurgitation is mild to moderate. Aortic regurgitation PHT measures 309 msec. Aortic valve sclerosis/calcification is present, without any evidence of aortic stenosis. Aortic valve mean gradient measures 10.0 mmHg. Aortic valve peak gradient measures 19.2 mmHg. Aortic valve area, by VTI measures 2.18 cm. Pulmonic Valve: The pulmonic valve was normal in structure. Pulmonic valve regurgitation is mild. No evidence of pulmonic stenosis. Aorta: The aortic root is normal in size and structure. Venous: The inferior vena cava is normal in size with greater than 50% respiratory variability, suggesting right atrial pressure of 3 mmHg. IAS/Shunts: No atrial level shunt detected by color flow Doppler. Additional Comments: 3D was performed not requiring image post processing on an independent workstation and was indeterminate.  LEFT VENTRICLE PLAX 2D LVIDd:         5.00 cm      Diastology LVIDs:         4.00 cm      LV e' medial:    17.20 cm/s LV PW:         1.00 cm      LV E/e' medial:  9.2 LV IVS:        0.80 cm      LV e' lateral:   23.40 cm/s LVOT diam:     2.10 cm      LV E/e' lateral: 6.8 LV SV:         74 LV SV Index:   43 LVOT Area:     3.46 cm  LV Volumes (MOD) LV vol d, MOD  A2C: 89.3 ml LV vol d, MOD A4C: 110.0 ml LV vol s, MOD A2C: 58.9 ml LV vol s, MOD A4C: 69.8 ml LV SV MOD A2C:     30.4 ml LV SV MOD A4C:     110.0 ml LV SV MOD BP:      34.7 ml RIGHT VENTRICLE RV Basal diam:  2.70 cm RV S prime:     13.50 cm/s TAPSE (M-mode): 1.0 cm LEFT ATRIUM             Index        RIGHT ATRIUM           Index LA diam:        3.50 cm 2.02 cm/m   RA Area:     11.80 cm LA Vol (A2C):   33.4 ml 19.29 ml/m  RA Volume:   23.30 ml  13.45 ml/m LA Vol (A4C):   29.1 ml 16.80 ml/m LA Biplane Vol: 31.4 ml 18.13 ml/m  AORTIC VALVE AV Area (Vmax):    2.23 cm AV Area (Vmean):   2.19 cm AV Area (VTI):     2.18 cm AV Vmax:           219.00 cm/s AV Vmean:          152.000 cm/s AV VTI:            0.341 m AV Peak Grad:      19.2 mmHg AV Mean Grad:      10.0 mmHg LVOT Vmax:         141.00 cm/s LVOT Vmean:        96.300 cm/s LVOT VTI:          0.215 m LVOT/AV VTI ratio: 0.63 AI  PHT:            309 msec  AORTA Ao Root diam: 3.70 cm Ao Asc diam:  3.20 cm MR Peak grad: 74.6 mmHg MR Vmax:      432.00 cm/s   SHUNTS MV E velocity: 158.00 cm/s  Systemic VTI:  0.22 m                             Systemic Diam: 2.10 cm Charlton Haws MD Electronically signed by Charlton Haws MD Signature Date/Time: 05/24/2023/11:29:45 AM    Final    NM Pulmonary Perfusion Result Date: 05/23/2023 CLINICAL DATA:  Pulmonary embolism (PE) suspected, high prob Chest pain. EXAM: NUCLEAR MEDICINE PERFUSION LUNG SCAN TECHNIQUE: Perfusion images were obtained in multiple projections after intravenous injection of radiopharmaceutical. Ventilation scans intentionally deferred if perfusion scan and chest x-ray adequate for interpretation during COVID 19 epidemic. RADIOPHARMACEUTICALS:  4 mCi Tc-37m MAA IV COMPARISON:  Chest radiograph performed same day, perfusion scan 03/10/2023 FINDINGS: Diminished perfusion in the right upper lobe, previous perfusion demonstrated segmental anomaly, currently appearing lobar. No abnormalities seen on concurrent  chest radiograph. IMPRESSION: Diminished perfusion to the right upper lobe, which has progressed from prior perfusion scan. Findings may represent progressive pulmonary embolus since December. Electronically Signed   By: Narda Rutherford M.D.   On: 05/23/2023 19:29   DG Chest Portable 1 View Result Date: 05/23/2023 CLINICAL DATA:  Chest pain. EXAM: PORTABLE CHEST 1 VIEW COMPARISON:  03/09/2023, PET CT 11/28/2022 FINDINGS: Heart is normal in size. Stable mediastinal contours, similar right paratracheal thickening without adenopathy on prior PET. Pulmonary vasculature is normal. No consolidation, pleural effusion, or pneumothorax. Pulmonary nodules on prior PET are not seen on the current exam. On limited assessment, no acute osseous abnormalities are seen. IMPRESSION: 1. No acute chest findings. 2. Pulmonary nodules on prior PET are not seen on the current exam. Electronically Signed   By: Narda Rutherford M.D.   On: 05/23/2023 19:25    PERFORMANCE STATUS (ECOG) : 2 - Symptomatic, <50% confined to bed  Review of Systems Unless otherwise noted, a complete review of systems is negative.  Physical Exam General: Thin, frail-appearing Pulmonary: Unlabored, cough Extremities: no edema, left BKA Skin: no rashes Neurological: Weakness but otherwise nonfocal  IMPRESSION: Patient with recent hospitalization for PE.  He is now readmitted after a fall. Workup is concerning for metastatic disease involving the lungs, liver, and lymph nodes.  Of note, patient had a PET scan in September 2024, which showed hypermetabolic nodules in the right upper lobe and right lower lobe concerning for primary bronchogenic carcinoma, although inflammatory or infection could not be excluded.  There was also mildly hypermetabolic activity associated with the right paratracheal and right hilar lymph nodes.  No metastatic activity was noted in abdomen or pelvis.  Patient then had a CT of abdomen and pelvis in December 2024  during a hospitalization.  That scan showed stable or slightly decreased size of right lower lobe pulmonary nodule when compared to previous PET scan.  No metastatic disease was noted in the abdomen or pelvis.  It appears that PET scan was ordered by PCP, although patient has also been followed by the [place].  I met with patient and his niece, [name].  Patient recalls his conversation earlier with the hospitalist and recognizes that workup is suggestive of a possible advanced stage malignancy.  Patient does not recall the PET scan in September nor any previous  medical conversations regarding possible cancer.  We had a long discussion today regarding goals.  Patient admits to significant frailty.  At baseline, he is wheelchair dependent in light of his previous BKA.  His oral intake is poor and he has had significant weight loss.  We discussed options for further workup versus focusing just on his comfort and quality of life.  We also explored the option of hospice in detail.  Patient states that he is "not ready to give up" and is interested in pursuing further workup and would like to speak with medical oncology to explore any possible treatment options.  Patient is interested in establishing advanced directives.  He says that these were previously done years ago at the [place].  He would want his niece, [name], to be his HCPOA.  Discussed CODE STATUS.  Patient states clearly and repeatedly that he would not want to be resuscitated or have his life prolonged artificially machines.  His niece was in agreement with DNR/DNI.  Symptomatically, patient reports some shortness of breath.  Recommend diagnostic thoracentesis with cytology as this may also provide therapeutic benefit.   PLAN: - Patient interested in pursuing tissue diagnosis - Recommend thoracentesis and cytology - Consider CT abdomen and pelvis for staging workup - Recommend referral to medical oncology - DNR/DNI  Case and plan  discussed with Dr. Randol Kern  Time Total: 70 minutes  Visit consisted of counseling and education dealing with the complex and emotionally intense issues of symptom management and palliative care in the setting of serious and potentially life-threatening illness.Greater than 50%  of this time was spent counseling and coordinating care related to the above assessment and plan.  Signed by: Laurette Schimke, PhD, NP-C
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
## 2023-06-21 NOTE — Progress Notes (Signed)
 A consult was placed to IV Therapy for new iv access; pt pulled out iv and is needing a blood tx and labs drawn;  MD at bedside at 1530 and asked that we please wait 10 minutes and come back.

## 2023-06-22 ENCOUNTER — Inpatient Hospital Stay (HOSPITAL_COMMUNITY)

## 2023-06-22 DIAGNOSIS — N179 Acute kidney failure, unspecified: Secondary | ICD-10-CM | POA: Diagnosis not present

## 2023-06-22 LAB — BPAM RBC
Blood Product Expiration Date: 202504302359
Blood Product Expiration Date: 202504302359
ISSUE DATE / TIME: 202504051725
ISSUE DATE / TIME: 202504052320
Unit Type and Rh: 5100
Unit Type and Rh: 5100

## 2023-06-22 LAB — TYPE AND SCREEN
ABO/RH(D): O POS
Antibody Screen: NEGATIVE
Unit division: 0
Unit division: 0

## 2023-06-22 LAB — GLUCOSE, CAPILLARY
Glucose-Capillary: 158 mg/dL — ABNORMAL HIGH (ref 70–99)
Glucose-Capillary: 223 mg/dL — ABNORMAL HIGH (ref 70–99)
Glucose-Capillary: 133 mg/dL — ABNORMAL HIGH (ref 70–99)
Glucose-Capillary: 166 mg/dL — ABNORMAL HIGH (ref 70–99)

## 2023-06-22 LAB — CBC
HCT: 29.8 % — ABNORMAL LOW (ref 39.0–52.0)
Hemoglobin: 10.4 g/dL — ABNORMAL LOW (ref 13.0–17.0)
MCH: 31.3 pg (ref 26.0–34.0)
MCHC: 34.9 g/dL (ref 30.0–36.0)
MCV: 89.8 fL (ref 80.0–100.0)
Platelets: 380 10*3/uL (ref 150–400)
RBC: 3.32 MIL/uL — ABNORMAL LOW (ref 4.22–5.81)
RDW: 16.8 % — ABNORMAL HIGH (ref 11.5–15.5)
WBC: 13.4 10*3/uL — ABNORMAL HIGH (ref 4.0–10.5)
nRBC: 0.2 % (ref 0.0–0.2)

## 2023-06-22 LAB — MAGNESIUM: Magnesium: 2.3 mg/dL (ref 1.7–2.4)

## 2023-06-22 LAB — LACTATE DEHYDROGENASE: LDH: 357 U/L — ABNORMAL HIGH (ref 98–192)

## 2023-06-22 LAB — PROTEIN, PLEURAL OR PERITONEAL FLUID: Total protein, fluid: <3 g/dL

## 2023-06-22 LAB — PROTEIN, TOTAL: Total Protein: 5.7 g/dL — ABNORMAL LOW (ref 6.5–8.1)

## 2023-06-22 LAB — BODY FLUID CELL COUNT WITH DIFFERENTIAL
Eos, Fluid: 0 %
Lymphs, Fluid: 82 %
Monocyte-Macrophage-Serous Fluid: 9 % — ABNORMAL LOW (ref 50–90)
Neutrophil Count, Fluid: 9 % (ref 0–25)
Total Nucleated Cell Count, Fluid: 726 uL (ref 0–1000)

## 2023-06-22 LAB — BASIC METABOLIC PANEL WITH GFR
Anion gap: 12 (ref 5–15)
BUN: 57 mg/dL — ABNORMAL HIGH (ref 8–23)
CO2: 19 mmol/L — ABNORMAL LOW (ref 22–32)
Calcium: 8.8 mg/dL — ABNORMAL LOW (ref 8.9–10.3)
Chloride: 97 mmol/L — ABNORMAL LOW (ref 98–111)
Creatinine, Ser: 2.3 mg/dL — ABNORMAL HIGH (ref 0.61–1.24)
GFR, Estimated: 30 mL/min — ABNORMAL LOW (ref >60)
Glucose, Bld: 199 mg/dL — ABNORMAL HIGH (ref 70–99)
Potassium: 5 mmol/L (ref 3.5–5.1)
Sodium: 128 mmol/L — ABNORMAL LOW (ref 135–145)

## 2023-06-22 LAB — LACTATE DEHYDROGENASE, PLEURAL OR PERITONEAL FLUID: LD, Fluid: 97 U/L — ABNORMAL HIGH (ref 3–23)

## 2023-06-22 LAB — PHOSPHORUS: Phosphorus: 3.7 mg/dL (ref 2.5–4.6)

## 2023-06-22 MED ORDER — RIVAROXABAN 20 MG PO TABS
20.0000 mg | ORAL_TABLET | Freq: Every day | ORAL | Status: DC
  Administered 2023-06-22: 20 mg via ORAL
  Filled 2023-06-22: qty 1

## 2023-06-22 MED ORDER — SODIUM BICARBONATE 650 MG PO TABS
650.0000 mg | ORAL_TABLET | Freq: Three times a day (TID) | ORAL | Status: DC
  Administered 2023-06-22 (×2): 650 mg via ORAL
  Filled 2023-06-22 (×2): qty 1

## 2023-06-22 MED ORDER — PANTOPRAZOLE SODIUM 40 MG PO TBEC
40.0000 mg | DELAYED_RELEASE_TABLET | Freq: Every day | ORAL | Status: DC
  Administered 2023-06-22: 40 mg via ORAL
  Filled 2023-06-22: qty 1

## 2023-06-22 MED ORDER — HALOPERIDOL LACTATE 5 MG/ML IJ SOLN
5.0000 mg | Freq: Once | INTRAMUSCULAR | Status: AC | PRN
  Administered 2023-06-22: 5 mg via INTRAVENOUS
  Filled 2023-06-22: qty 1

## 2023-06-22 MED ORDER — LIDOCAINE HCL (PF) 1 % IJ SOLN: 1.0000 mL | Freq: Once | INTRAMUSCULAR | Status: DC

## 2023-06-22 MED ORDER — ENOXAPARIN SODIUM 40 MG/0.4ML IJ SOSY: 40.0000 mg | PREFILLED_SYRINGE | INTRAMUSCULAR | Status: DC

## 2023-06-22 MED ORDER — ASPIRIN 81 MG PO TBEC
81.0000 mg | DELAYED_RELEASE_TABLET | Freq: Every day | ORAL | Status: DC
  Administered 2023-06-22: 81 mg via ORAL
  Filled 2023-06-22: qty 1

## 2023-06-22 NOTE — Progress Notes (Signed)
 Patient has been confused and increasingly agitated. He is unable to be redirected. There is concern for the patient's safety.  Plan to give a dose of Haldol.

## 2023-06-22 NOTE — Progress Notes (Signed)
 Pt had a fall at 1529, Bed alarm was on and bed rails up at time of fall. Pt found on floor sitting on buttocks. Provider quickly came to bedside to evaluate along with Charge nurse. Skin tears on right and left knee, and R elbow. All wounds were dressed with gauze and medipore tape. Documented new skin tears. Called family, Martin Thomas and explained fall. Place on Tele-sitter for closer monitoring, keeping door open, placed floor mats for additional safety.

## 2023-06-22 NOTE — Procedures (Signed)
 Ultrasound-guided diagnostic and therapeutic right sided thoracentesis performed yielding 650 milliliters of light amber colored fluid. No immediate complications.   Diagnostic fluid was sent to the lab for further analysis. Follow-up chest x-ray pending. EBL is < 2 ml.
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
## 2023-06-23 ENCOUNTER — Inpatient Hospital Stay (HOSPITAL_COMMUNITY)

## 2023-06-23 DIAGNOSIS — I5022 Chronic systolic (congestive) heart failure: Secondary | ICD-10-CM

## 2023-06-23 DIAGNOSIS — N1831 Chronic kidney disease, stage 3a: Secondary | ICD-10-CM

## 2023-06-23 DIAGNOSIS — S0101XA Laceration without foreign body of scalp, initial encounter: Secondary | ICD-10-CM | POA: Insufficient documentation

## 2023-06-23 DIAGNOSIS — Z515 Encounter for palliative care: Secondary | ICD-10-CM

## 2023-06-23 DIAGNOSIS — C3490 Malignant neoplasm of unspecified part of unspecified bronchus or lung: Secondary | ICD-10-CM | POA: Insufficient documentation

## 2023-06-23 DIAGNOSIS — W19XXXA Unspecified fall, initial encounter: Secondary | ICD-10-CM | POA: Insufficient documentation

## 2023-06-23 DIAGNOSIS — C799 Secondary malignant neoplasm of unspecified site: Secondary | ICD-10-CM | POA: Diagnosis present

## 2023-06-23 DIAGNOSIS — J9601 Acute respiratory failure with hypoxia: Secondary | ICD-10-CM | POA: Insufficient documentation

## 2023-06-23 DIAGNOSIS — N179 Acute kidney failure, unspecified: Secondary | ICD-10-CM | POA: Diagnosis not present

## 2023-06-23 DIAGNOSIS — C787 Secondary malignant neoplasm of liver and intrahepatic bile duct: Secondary | ICD-10-CM | POA: Insufficient documentation

## 2023-06-23 DIAGNOSIS — R410 Disorientation, unspecified: Secondary | ICD-10-CM | POA: Insufficient documentation

## 2023-06-23 DIAGNOSIS — J90 Pleural effusion, not elsewhere classified: Secondary | ICD-10-CM | POA: Insufficient documentation

## 2023-06-23 DIAGNOSIS — D62 Acute posthemorrhagic anemia: Secondary | ICD-10-CM | POA: Insufficient documentation

## 2023-06-23 DIAGNOSIS — C7989 Secondary malignant neoplasm of other specified sites: Secondary | ICD-10-CM

## 2023-06-23 LAB — BLOOD GAS, ARTERIAL
Acid-base deficit: 11.9 mmol/L — ABNORMAL HIGH (ref 0.0–2.0)
Bicarbonate: 18.5 mmol/L — ABNORMAL LOW (ref 20.0–28.0)
O2 Saturation: 96.2 %
Patient temperature: 36.3
pCO2 arterial: 59 mmHg — ABNORMAL HIGH (ref 32–48)
pH, Arterial: 7.1 — CL (ref 7.35–7.45)
pO2, Arterial: 77 mmHg — ABNORMAL LOW (ref 83–108)

## 2023-06-23 MED ORDER — ATROPINE SULFATE 1 % OP SOLN: 4.0000 [drp] | OPHTHALMIC | Status: DC | PRN

## 2023-06-23 MED ORDER — HALOPERIDOL LACTATE 2 MG/ML PO CONC: 0.5000 mg | ORAL | Status: DC | PRN

## 2023-06-23 MED ORDER — LORAZEPAM 2 MG/ML IJ SOLN
INTRAMUSCULAR | Status: AC
  Administered 2023-06-23: 0.5 mg via INTRAVENOUS
  Filled 2023-06-23: qty 1

## 2023-06-23 MED ORDER — LORAZEPAM 2 MG/ML IJ SOLN: 1.0000 mg | INTRAMUSCULAR | Status: DC | PRN

## 2023-06-23 MED ORDER — ONDANSETRON 4 MG PO TBDP: 4.0000 mg | ORAL_TABLET | Freq: Four times a day (QID) | ORAL | Status: DC | PRN

## 2023-06-23 MED ORDER — HALOPERIDOL 0.5 MG PO TABS: 0.5000 mg | ORAL_TABLET | ORAL | Status: DC | PRN

## 2023-06-23 MED ORDER — HALOPERIDOL LACTATE 5 MG/ML IJ SOLN: 0.5000 mg | INTRAMUSCULAR | Status: DC | PRN

## 2023-06-23 MED ORDER — LORAZEPAM 2 MG/ML IJ SOLN: 0.5000 mg | Freq: Once | INTRAMUSCULAR | Status: AC

## 2023-06-23 MED ORDER — LORAZEPAM 2 MG/ML PO CONC: 1.0000 mg | ORAL | Status: DC | PRN

## 2023-06-23 MED ORDER — MORPHINE SULFATE (PF) 2 MG/ML IV SOLN
1.0000 mg | INTRAVENOUS | Status: DC | PRN
  Administered 2023-06-23: 2 mg via INTRAVENOUS
  Filled 2023-06-23: qty 1

## 2023-06-23 MED ORDER — LORAZEPAM 1 MG PO TABS: 1.0000 mg | ORAL_TABLET | ORAL | Status: DC | PRN

## 2023-06-23 MED ORDER — ONDANSETRON HCL 4 MG/2ML IJ SOLN: 4.0000 mg | Freq: Four times a day (QID) | INTRAMUSCULAR | Status: DC | PRN

## 2023-06-23 MED ORDER — FUROSEMIDE 10 MG/ML IJ SOLN
80.0000 mg | Freq: Once | INTRAMUSCULAR | Status: AC
  Administered 2023-06-23: 80 mg via INTRAVENOUS
  Filled 2023-06-23: qty 8

## 2023-06-25 LAB — BODY FLUID CULTURE W GRAM STAIN

## 2023-06-26 LAB — CYTOLOGY - NON PAP

## 2023-06-27 DIAGNOSIS — C7A1 Malignant poorly differentiated neuroendocrine tumors: Secondary | ICD-10-CM | POA: Insufficient documentation
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
## 2023-07-17 DEATH — deceased

## 2023-08-12 ENCOUNTER — Ambulatory Visit: Admitting: Physician Assistant
